# Patient Record
Sex: Female | Born: 1990 | Hispanic: Yes | Smoking: Never smoker
Health system: Southern US, Community
[De-identification: ages and names within clinical notes are randomized; demographics above are authoritative.]

## PROBLEM LIST (undated history)

## (undated) ENCOUNTER — Inpatient Hospital Stay (HOSPITAL_COMMUNITY): Payer: Self-pay

## (undated) DIAGNOSIS — G43909 Migraine, unspecified, not intractable, without status migrainosus: Secondary | ICD-10-CM

## (undated) DIAGNOSIS — J4520 Mild intermittent asthma, uncomplicated: Secondary | ICD-10-CM

## (undated) DIAGNOSIS — G40901 Epilepsy, unspecified, not intractable, with status epilepticus: Secondary | ICD-10-CM

## (undated) DIAGNOSIS — M419 Scoliosis, unspecified: Secondary | ICD-10-CM

## (undated) DIAGNOSIS — G9340 Encephalopathy, unspecified: Secondary | ICD-10-CM

## (undated) DIAGNOSIS — A749 Chlamydial infection, unspecified: Secondary | ICD-10-CM

## (undated) DIAGNOSIS — J45909 Unspecified asthma, uncomplicated: Secondary | ICD-10-CM

## (undated) DIAGNOSIS — N83209 Unspecified ovarian cyst, unspecified side: Secondary | ICD-10-CM

## (undated) DIAGNOSIS — S0990XA Unspecified injury of head, initial encounter: Secondary | ICD-10-CM

## (undated) DIAGNOSIS — Z87898 Personal history of other specified conditions: Secondary | ICD-10-CM

## (undated) DIAGNOSIS — R569 Unspecified convulsions: Secondary | ICD-10-CM

## (undated) HISTORY — DX: Mild intermittent asthma, uncomplicated: J45.20

## (undated) HISTORY — DX: Epilepsy, unspecified, not intractable, with status epilepticus: G40.901

## (undated) HISTORY — PX: BACK SURGERY: SHX140

## (undated) HISTORY — DX: Personal history of other specified conditions: Z87.898

## (undated) HISTORY — DX: Encephalopathy, unspecified: G93.40

---
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
## 2010-08-07 ENCOUNTER — Emergency Department (HOSPITAL_COMMUNITY)
Admission: EM | Admit: 2010-08-07 | Discharge: 2010-08-07 | Payer: Self-pay | Source: Home / Self Care | Admitting: Emergency Medicine

## 2010-08-07 ENCOUNTER — Inpatient Hospital Stay (HOSPITAL_COMMUNITY): Admission: AD | Admit: 2010-08-07 | Discharge: 2010-08-07 | Payer: Self-pay | Admitting: Obstetrics & Gynecology

## 2010-10-31 ENCOUNTER — Other Ambulatory Visit: Payer: Self-pay | Admitting: Family Medicine

## 2010-10-31 DIAGNOSIS — Z3689 Encounter for other specified antenatal screening: Secondary | ICD-10-CM

## 2010-11-06 ENCOUNTER — Ambulatory Visit (HOSPITAL_COMMUNITY)
Admission: RE | Admit: 2010-11-06 | Discharge: 2010-11-06 | Disposition: A | Discharge status: Home / Self Care | Payer: Medicaid Other | Source: Ambulatory Visit | Attending: Family Medicine | Admitting: Family Medicine

## 2010-11-06 ENCOUNTER — Encounter (HOSPITAL_COMMUNITY): Payer: Self-pay

## 2010-11-06 DIAGNOSIS — O358XX0 Maternal care for other (suspected) fetal abnormality and damage, not applicable or unspecified: Secondary | ICD-10-CM | POA: Insufficient documentation

## 2010-11-06 DIAGNOSIS — Z3689 Encounter for other specified antenatal screening: Secondary | ICD-10-CM

## 2010-11-06 DIAGNOSIS — Z1389 Encounter for screening for other disorder: Secondary | ICD-10-CM | POA: Insufficient documentation

## 2010-11-06 DIAGNOSIS — Z363 Encounter for antenatal screening for malformations: Secondary | ICD-10-CM | POA: Insufficient documentation

## 2010-11-15 ENCOUNTER — Other Ambulatory Visit: Payer: Self-pay | Admitting: Family Medicine

## 2010-11-15 DIAGNOSIS — Z3689 Encounter for other specified antenatal screening: Secondary | ICD-10-CM

## 2010-11-20 ENCOUNTER — Ambulatory Visit (HOSPITAL_COMMUNITY): Payer: Medicaid Other

## 2010-11-20 ENCOUNTER — Ambulatory Visit (HOSPITAL_COMMUNITY)
Admission: RE | Admit: 2010-11-20 | Discharge: 2010-11-20 | Disposition: A | Discharge status: Home / Self Care | Payer: Medicaid Other | Source: Ambulatory Visit | Attending: Family Medicine | Admitting: Family Medicine

## 2010-11-20 DIAGNOSIS — Z3689 Encounter for other specified antenatal screening: Secondary | ICD-10-CM | POA: Insufficient documentation

## 2010-12-04 LAB — GC/CHLAMYDIA PROBE AMP, GENITAL
Chlamydia, DNA Probe: NEGATIVE
GC Probe Amp, Genital: NEGATIVE

## 2010-12-04 LAB — WET PREP, GENITAL
Clue Cells Wet Prep HPF POC: NONE SEEN
Trich, Wet Prep: NONE SEEN
Yeast Wet Prep HPF POC: NONE SEEN

## 2010-12-04 LAB — POCT PREGNANCY, URINE: Preg Test, Ur: POSITIVE

## 2011-03-03 ENCOUNTER — Inpatient Hospital Stay (HOSPITAL_COMMUNITY)
Admission: AD | Admit: 2011-03-03 | Discharge: 2011-03-03 | Disposition: A | Discharge status: Home / Self Care | Payer: Medicaid Other | Source: Ambulatory Visit | Attending: Family Medicine | Admitting: Family Medicine

## 2011-03-03 DIAGNOSIS — O4700 False labor before 37 completed weeks of gestation, unspecified trimester: Secondary | ICD-10-CM | POA: Insufficient documentation

## 2011-03-15 ENCOUNTER — Inpatient Hospital Stay (HOSPITAL_COMMUNITY)
Admission: AD | Admit: 2011-03-15 | Discharge: 2011-03-15 | Disposition: A | Discharge status: Home / Self Care | Payer: Medicaid Other | Source: Ambulatory Visit | Attending: Obstetrics & Gynecology | Admitting: Obstetrics & Gynecology

## 2011-03-15 DIAGNOSIS — O4290 Premature rupture of membranes, unspecified as to length of time between rupture and onset of labor, unspecified weeks of gestation: Secondary | ICD-10-CM

## 2011-03-15 LAB — WET PREP, GENITAL
Trich, Wet Prep: NONE SEEN
Yeast Wet Prep HPF POC: NONE SEEN

## 2011-03-17 ENCOUNTER — Inpatient Hospital Stay (HOSPITAL_COMMUNITY)
Admission: AD | Admit: 2011-03-17 | Discharge: 2011-03-19 | DRG: 775 | Disposition: A | Discharge status: Home / Self Care | Payer: Medicaid Other | Source: Ambulatory Visit | Attending: Obstetrics & Gynecology | Admitting: Obstetrics & Gynecology

## 2011-03-17 LAB — CBC
HCT: 28.6 % — ABNORMAL LOW (ref 36.0–46.0)
Hemoglobin: 9.4 g/dL — ABNORMAL LOW (ref 12.0–15.0)
MCH: 28.7 pg (ref 26.0–34.0)
MCHC: 32.9 g/dL (ref 30.0–36.0)
MCV: 87.5 fL (ref 78.0–100.0)
Platelets: 203 10*3/uL (ref 150–400)
RBC: 3.27 MIL/uL — ABNORMAL LOW (ref 3.87–5.11)
RDW: 13.5 % (ref 11.5–15.5)
WBC: 10.4 10*3/uL (ref 4.0–10.5)

## 2011-03-17 LAB — ABO/RH: ABO/RH(D): O POS

## 2011-03-17 LAB — RPR: RPR Ser Ql: NONREACTIVE

## 2011-06-26 LAB — RAPID STREP SCREEN (MED CTR MEBANE ONLY): Streptococcus, Group A Screen (Direct): NEGATIVE

## 2011-06-28 LAB — COMPREHENSIVE METABOLIC PANEL
ALT: 11 U/L (ref 0–35)
AST: 18 U/L (ref 0–37)
Albumin: 3.9 g/dL (ref 3.5–5.2)
Alkaline Phosphatase: 59 U/L (ref 47–119)
BUN: 10 mg/dL (ref 6–23)
CO2: 26 mEq/L (ref 19–32)
Calcium: 9.4 mg/dL (ref 8.4–10.5)
Chloride: 103 mEq/L (ref 96–112)
Creatinine, Ser: 0.56 mg/dL (ref 0.4–1.2)
Glucose, Bld: 94 mg/dL (ref 70–99)
Potassium: 3.5 mEq/L (ref 3.5–5.1)
Sodium: 138 mEq/L (ref 135–145)
Total Bilirubin: 0.7 mg/dL (ref 0.3–1.2)
Total Protein: 7.4 g/dL (ref 6.0–8.3)

## 2011-06-28 LAB — CBC
HCT: 36.3 % (ref 36.0–49.0)
Hemoglobin: 12.5 g/dL (ref 12.0–16.0)
MCHC: 34.4 g/dL (ref 31.0–37.0)
MCV: 92.3 fL (ref 78.0–98.0)
Platelets: 207 10*3/uL (ref 150–400)
RBC: 3.93 MIL/uL (ref 3.80–5.70)
RDW: 12.7 % (ref 11.4–15.5)
WBC: 8.8 10*3/uL (ref 4.5–13.5)

## 2011-06-28 LAB — HCG, SERUM, QUALITATIVE: Preg, Serum: NEGATIVE

## 2011-09-24 NOTE — L&D Delivery Note (Signed)
Delivery Note At 4:30 PM a viable and healthy female was delivered with easily reduced nuchal x 1 via  (Presentation: Left Occiput Anterior ).  APGAR:8,9; weight 6 lb, 12 oz.   Placenta status: spontaneous, in tact with trailing membranes.  Cord: 3 vessel with the following complications: none.    Anesthesia: None   Episiotomy: None Lacerations: 1st degree vaginal, hemostatic, no repair Est. Blood Loss (mL): 350cc  Mom to postpartum.  Baby to nursery-stable. Mom planning to bf and bottle feed, hoping to bf more; has appt 8 weeks after delivery for mirena  Simone Curia 06/19/2012, 4:46 PM     I was present for delivery and agree with above. Napoleon Form, MD

## 2011-10-16 ENCOUNTER — Encounter (HOSPITAL_COMMUNITY): Payer: Self-pay

## 2011-10-16 ENCOUNTER — Emergency Department (HOSPITAL_COMMUNITY)
Admission: EM | Admit: 2011-10-16 | Discharge: 2011-10-16 | Disposition: A | Discharge status: Home / Self Care | Payer: Self-pay | Attending: Emergency Medicine | Admitting: Emergency Medicine

## 2011-10-16 DIAGNOSIS — H53149 Visual discomfort, unspecified: Secondary | ICD-10-CM | POA: Insufficient documentation

## 2011-10-16 DIAGNOSIS — R05 Cough: Secondary | ICD-10-CM | POA: Insufficient documentation

## 2011-10-16 DIAGNOSIS — R42 Dizziness and giddiness: Secondary | ICD-10-CM | POA: Insufficient documentation

## 2011-10-16 DIAGNOSIS — G40909 Epilepsy, unspecified, not intractable, without status epilepticus: Secondary | ICD-10-CM | POA: Insufficient documentation

## 2011-10-16 DIAGNOSIS — J3489 Other specified disorders of nose and nasal sinuses: Secondary | ICD-10-CM | POA: Insufficient documentation

## 2011-10-16 DIAGNOSIS — H9209 Otalgia, unspecified ear: Secondary | ICD-10-CM | POA: Insufficient documentation

## 2011-10-16 DIAGNOSIS — R51 Headache: Secondary | ICD-10-CM | POA: Insufficient documentation

## 2011-10-16 DIAGNOSIS — R059 Cough, unspecified: Secondary | ICD-10-CM | POA: Insufficient documentation

## 2011-10-16 DIAGNOSIS — R070 Pain in throat: Secondary | ICD-10-CM | POA: Insufficient documentation

## 2011-10-16 DIAGNOSIS — R110 Nausea: Secondary | ICD-10-CM | POA: Insufficient documentation

## 2011-10-16 DIAGNOSIS — H6690 Otitis media, unspecified, unspecified ear: Secondary | ICD-10-CM | POA: Insufficient documentation

## 2011-10-16 HISTORY — DX: Unspecified convulsions: R56.9

## 2011-10-16 HISTORY — DX: Unspecified injury of head, initial encounter: S09.90XA

## 2011-10-16 MED ORDER — METOCLOPRAMIDE HCL 5 MG/ML IJ SOLN
10.0000 mg | Freq: Once | INTRAMUSCULAR | Status: AC
  Administered 2011-10-16: 10 mg via INTRAVENOUS
  Filled 2011-10-16: qty 2

## 2011-10-16 MED ORDER — SODIUM CHLORIDE 0.9 % IV BOLUS (SEPSIS)
1000.0000 mL | Freq: Once | INTRAVENOUS | Status: AC
  Administered 2011-10-16: 1000 mL via INTRAVENOUS

## 2011-10-16 MED ORDER — AMOXICILLIN 500 MG PO CAPS: 500.0000 mg | ORAL_CAPSULE | Freq: Three times a day (TID) | ORAL | Status: AC

## 2011-10-16 MED ORDER — KETOROLAC TROMETHAMINE 30 MG/ML IJ SOLN
30.0000 mg | Freq: Once | INTRAMUSCULAR | Status: AC
  Administered 2011-10-16: 30 mg via INTRAVENOUS
  Filled 2011-10-16: qty 1

## 2011-10-16 MED ORDER — IBUPROFEN 800 MG PO TABS: 800.0000 mg | ORAL_TABLET | Freq: Three times a day (TID) | ORAL | Status: AC | PRN

## 2011-10-16 MED ORDER — DIPHENHYDRAMINE HCL 50 MG/ML IJ SOLN
25.0000 mg | Freq: Once | INTRAMUSCULAR | Status: AC
  Administered 2011-10-16: 50 mg via INTRAVENOUS
  Filled 2011-10-16: qty 1

## 2011-10-16 NOTE — ED Notes (Signed)
Rt. Side facial pain began last night, denies any injuries. Denies any dental problems

## 2011-10-16 NOTE — ED Provider Notes (Signed)
History     CSN: [id number]  Arrival date & time 10/16/11  1010   First MD Initiated Contact with Patient 10/16/11 1028      Chief Complaint  Patient presents with  . Facial Pain    (Consider location/radiation/quality/duration/timing/severity/associated sxs/prior treatment)  The history is provided by the patient.  21 year old female presents with R sided headache that began last night as she was lying in bed. Onset was gradual. She describes it as throbbing, tingling, and a 9/10. She denies any trauma or injury to head. Denies any dental or facial pain. Associated symptoms include photophobia, nausea, and dizziness. She reports that she gets a lot of headaches but usually not this bad. Her son was seen here in the ED about 1 week ago and diagnosed with bronchitis. She has had similar symptoms (cough, sore throat, congestion) for about 2 weeks. She also complains of L ear pain.   Past Medical History  Diagnosis Date  . Seizures   . Closed head injury     History reviewed. No pertinent past surgical history.  History reviewed. No pertinent family history.  History  Substance Use Topics  . Smoking status: Never Smoker   . Smokeless tobacco: Not on file  . Alcohol Use: No    OB History    Grav Para Term Preterm Abortions TAB SAB Ect Mult Living   1         1      Review of Systems All pertinent positives and negatives reviewed in the history of present illness  Allergies  Review of patient's allergies indicates no known allergies.  Home Medications  No current outpatient prescriptions on file.  BP 102/65  Pulse 110  Temp(Src) 98.5 F (36.9 C) (Oral)  Resp 16  Ht 5\' 1"  (1.549 m)  Wt 120 lb (54.432 kg)  BMI 22.67 kg/m2  SpO2 100%  LMP 10/14/2011  Breastfeeding? Unknown  Physical Exam  Constitutional: She is oriented to person, place, and time. She appears well-developed and well-nourished. No distress.  HENT:  Head: Normocephalic and atraumatic.  Right  Ear: Tympanic membrane, external ear and ear canal normal.  Left Ear: There is tenderness. Tympanic membrane is erythematous.  Mouth/Throat: Oropharynx is clear and moist.  Eyes: Pupils are equal, round, and reactive to light.  Cardiovascular: Normal rate, regular rhythm, normal heart sounds and intact distal pulses.   Pulmonary/Chest: Effort normal and breath sounds normal.  Abdominal: Soft. Bowel sounds are normal.  Musculoskeletal: Normal range of motion.  Neurological: She is alert and oriented to person, place, and time. She has normal strength. She displays no tremor. No cranial nerve deficit or sensory deficit. She displays no seizure activity.  Skin: Skin is warm and dry. She is not diaphoretic.  Psychiatric: She has a normal mood and affect. Her behavior is normal.    ED Course  Procedures (including critical care time)  Patient has a headache that was gradual in onset.  She was treated with pain medication here in the emergency room along with fluids.  The patient has been dealing with an Upper respiratory tract infection at this time appears to have an acute otitis media in her left ear.  Patient to be advised to return if any worsening in her condition.  This headache seems to be a migrainous type headache based on the fact she has photosensitivity and is unilateral.       MDM  MDM Reviewed: vitals and nursing note  \  Carlyle Dolly, PA-C 10/16/11 1227

## 2011-10-17 NOTE — ED Provider Notes (Signed)
Medical screening examination/treatment/procedure(s) were performed by non-physician practitioner and as supervising physician I was immediately available for consultation/collaboration.  Adley Castello, MD 10/17/11 0750 

## 2011-11-05 ENCOUNTER — Inpatient Hospital Stay (HOSPITAL_COMMUNITY)
Admission: AD | Admit: 2011-11-05 | Discharge: 2011-11-05 | Disposition: A | Discharge status: Home / Self Care | Payer: Medicaid Other | Source: Ambulatory Visit | Attending: Family Medicine | Admitting: Family Medicine

## 2011-11-05 ENCOUNTER — Encounter (HOSPITAL_COMMUNITY): Payer: Self-pay

## 2011-11-05 DIAGNOSIS — Z3201 Encounter for pregnancy test, result positive: Secondary | ICD-10-CM | POA: Insufficient documentation

## 2011-11-05 HISTORY — DX: Scoliosis, unspecified: M41.9

## 2011-11-05 LAB — POCT PREGNANCY, URINE: Preg Test, Ur: POSITIVE — AB

## 2011-11-05 NOTE — Progress Notes (Signed)
Pt states here for EDC only. Denies bleeding or vaginal d/c changes. Denies pain at present. Does have mild pain when picking up her 71 month old son. Has had some nausea. Denies at present.

## 2011-11-05 NOTE — ED Provider Notes (Signed)
Erin Price IS A 21 y.o. female who presents to MAU for pregnancy verification.   [place], [place] 11/06/11 1527

## 2011-11-06 LAB — POCT PREGNANCY, URINE: Preg Test, Ur: POSITIVE — AB

## 2011-11-07 NOTE — ED Provider Notes (Signed)
Chart reviewed and agree with management and plan.  

## 2012-01-10 ENCOUNTER — Inpatient Hospital Stay (HOSPITAL_COMMUNITY)
Admission: AD | Admit: 2012-01-10 | Discharge: 2012-01-11 | Disposition: A | Discharge status: Home / Self Care | Payer: Medicaid Other | Source: Ambulatory Visit | Attending: Obstetrics & Gynecology | Admitting: Obstetrics & Gynecology

## 2012-01-10 ENCOUNTER — Encounter (HOSPITAL_COMMUNITY): Payer: Self-pay | Admitting: *Deleted

## 2012-01-10 DIAGNOSIS — R109 Unspecified abdominal pain: Secondary | ICD-10-CM | POA: Insufficient documentation

## 2012-01-10 DIAGNOSIS — O209 Hemorrhage in early pregnancy, unspecified: Secondary | ICD-10-CM | POA: Insufficient documentation

## 2012-01-10 DIAGNOSIS — N939 Abnormal uterine and vaginal bleeding, unspecified: Secondary | ICD-10-CM

## 2012-01-10 DIAGNOSIS — N949 Unspecified condition associated with female genital organs and menstrual cycle: Secondary | ICD-10-CM

## 2012-01-10 LAB — URINE MICROSCOPIC-ADD ON

## 2012-01-10 LAB — WET PREP, GENITAL
Clue Cells Wet Prep HPF POC: NONE SEEN
Trich, Wet Prep: NONE SEEN
Yeast Wet Prep HPF POC: NONE SEEN

## 2012-01-10 LAB — URINALYSIS, ROUTINE W REFLEX MICROSCOPIC
Bilirubin Urine: NEGATIVE
Glucose, UA: NEGATIVE mg/dL
Ketones, ur: NEGATIVE mg/dL
Leukocytes, UA: NEGATIVE
Nitrite: NEGATIVE
Protein, ur: NEGATIVE mg/dL
Specific Gravity, Urine: 1.015 (ref 1.005–1.030)
Urobilinogen, UA: 1 mg/dL (ref 0.0–1.0)
pH: 7.5 (ref 5.0–8.0)

## 2012-01-10 NOTE — MAU Provider Note (Signed)
Kathryne Eriksson y.o.G2P1001 @[redacted]w[redacted]d  by LMP Chief Complaint  Patient presents with  . Abdominal Pain  . Vaginal Bleeding     First Provider Initiated Contact with Patient 01/10/12 2300      SUBJECTIVE  HPI: PT presents to MAU with left inguinal area sharp intermittent pain that is worse when lifting and when urinating.  Pt also reports seeing pinkish spotting when wiping at 9:30 pm tonight.  She denies seeing bleeding after urinating when arriving in MAU.  She denies uterine cramping/contractions, LOF, bright red vaginal bleeding, n/v, fever/chills.  Pt reports she and partner had intercourse last night.  She has appointment on Monday at [facility] for prenatal care.  Past Medical History  Diagnosis Date  . Seizures   . Closed head injury   . Scoliosis    Past Surgical History  Procedure Date  . Back surgery     screws placed for scoliosis   History   Social History  . Marital Status: Married    Spouse Name: N/A    Number of Children: N/A  . Years of Education: N/A   Occupational History  . Not on file.   Social History Main Topics  . Smoking status: Never Smoker   . Smokeless tobacco: Never Used  . Alcohol Use: No  . Drug Use: No  . Sexually Active: Yes    Birth Control/ Protection: None   Other Topics Concern  . Not on file   Social History Narrative  . No narrative on file   No current facility-administered medications on file prior to encounter.   No current outpatient prescriptions on file prior to encounter.   No Known Allergies  ROS: Pertinent items in HPI  OBJECTIVE Blood pressure 120/66, pulse 100, temperature 99 F (37.2 C), temperature source Oral, resp. rate 18, height 5' 1.75" (1.568 m), weight 51.71 kg (114 lb), last menstrual period 10/14/2011. GENERAL: Well-developed, well-nourished female in no acute distress.  ABDOMEN: Soft, nontender EXTREMITIES: Nontender, no edema Pelvic exam: Cervix with erythema surrounding os, visually closed, scant  white discharge, no bleeding noted, vaginal walls and external genitalia normal Cervical exam: Cervix 0/long/high, firm, posterior, no blood on glove following exam  FHT by doppler: 158  LAB RESULTS Results for orders placed during the hospital encounter of 01/10/12 (from the past 24 hour(s))  URINALYSIS, ROUTINE W REFLEX MICROSCOPIC     Status: Abnormal   Collection Time   01/10/12 10:25 PM      Component Value Range   Color, Urine YELLOW  YELLOW    APPearance CLOUDY (*) CLEAR    Specific Gravity, Urine 1.015  1.005 - 1.030    pH 7.5  5.0 - 8.0    Glucose, UA NEGATIVE  NEGATIVE (mg/dL)   Hgb urine dipstick TRACE (*) NEGATIVE    Bilirubin Urine NEGATIVE  NEGATIVE    Ketones, ur NEGATIVE  NEGATIVE (mg/dL)   Protein, ur NEGATIVE  NEGATIVE (mg/dL)   Urobilinogen, UA 1.0  0.0 - 1.0 (mg/dL)   Nitrite NEGATIVE  NEGATIVE    Leukocytes, UA NEGATIVE  NEGATIVE   URINE MICROSCOPIC-ADD ON     Status: Abnormal   Collection Time   01/10/12 10:25 PM      Component Value Range   Squamous Epithelial / LPF FEW (*) RARE    RBC / HPF 0-2  <3 (RBC/hpf)   Bacteria, UA FEW (*) RARE    Urine-Other AMORPHOUS URATES/PHOSPHATES    WET PREP, GENITAL     Status: Abnormal  Collection Time   01/10/12 11:00 PM      Component Value Range   Yeast Wet Prep HPF POC NONE SEEN  NONE SEEN    Trich, Wet Prep NONE SEEN  NONE SEEN    Clue Cells Wet Prep HPF POC NONE SEEN  NONE SEEN    WBC, Wet Prep HPF POC FEW (*) NONE SEEN     IMAGING Not indicated  ASSESSMENT Round ligament pain Postcoital vaginal bleeding in pregnancy  PLAN D/C home with bleeding precautions Urine sent for culture F/U with prenatal provider Return to MAU as needed  Medication List    Notice       You have not been prescribed any medications.             No Follow-up on file.    LEFTWICH-KIRBY, Francetta Ilg 01/10/2012 11:04 PM

## 2012-01-10 NOTE — Discharge Instructions (Signed)

## 2012-01-10 NOTE — MAU Note (Signed)
Pt states, " I've had sharp in my left lower abdomen off and on 2 days, and it occurred tonight at 9:30 and I went to the bathroom and when I wiped I saw pinkish blood on paper."

## 2012-01-11 LAB — GC/CHLAMYDIA PROBE AMP, GENITAL
Chlamydia, DNA Probe: NEGATIVE
GC Probe Amp, Genital: NEGATIVE

## 2012-01-20 ENCOUNTER — Other Ambulatory Visit (HOSPITAL_COMMUNITY): Payer: Self-pay | Admitting: Family

## 2012-01-20 DIAGNOSIS — Z3689 Encounter for other specified antenatal screening: Secondary | ICD-10-CM

## 2012-01-20 LAB — OB RESULTS CONSOLE RPR: RPR: NONREACTIVE

## 2012-01-20 LAB — OB RESULTS CONSOLE HIV ANTIBODY (ROUTINE TESTING): HIV: NONREACTIVE

## 2012-01-20 LAB — OB RESULTS CONSOLE RUBELLA ANTIBODY, IGM: Rubella: IMMUNE

## 2012-01-20 LAB — OB RESULTS CONSOLE GC/CHLAMYDIA
Chlamydia: NEGATIVE
Gonorrhea: NEGATIVE

## 2012-01-20 LAB — OB RESULTS CONSOLE ANTIBODY SCREEN: Antibody Screen: NEGATIVE

## 2012-01-20 LAB — OB RESULTS CONSOLE HEPATITIS B SURFACE ANTIGEN: Hepatitis B Surface Ag: NEGATIVE

## 2012-02-11 ENCOUNTER — Ambulatory Visit (HOSPITAL_COMMUNITY)
Admission: RE | Admit: 2012-02-11 | Discharge: 2012-02-11 | Disposition: A | Discharge status: Home / Self Care | Payer: Medicaid Other | Source: Ambulatory Visit | Attending: Family | Admitting: Family

## 2012-02-11 DIAGNOSIS — Z1389 Encounter for screening for other disorder: Secondary | ICD-10-CM | POA: Insufficient documentation

## 2012-02-11 DIAGNOSIS — Z363 Encounter for antenatal screening for malformations: Secondary | ICD-10-CM | POA: Insufficient documentation

## 2012-02-11 DIAGNOSIS — Z3689 Encounter for other specified antenatal screening: Secondary | ICD-10-CM

## 2012-02-11 DIAGNOSIS — O358XX0 Maternal care for other (suspected) fetal abnormality and damage, not applicable or unspecified: Secondary | ICD-10-CM | POA: Insufficient documentation

## 2012-05-01 ENCOUNTER — Encounter (HOSPITAL_COMMUNITY): Payer: Self-pay | Admitting: *Deleted

## 2012-05-01 ENCOUNTER — Inpatient Hospital Stay (HOSPITAL_COMMUNITY)
Admission: AD | Admit: 2012-05-01 | Discharge: 2012-05-01 | Disposition: A | Discharge status: Home / Self Care | Payer: Medicaid Other | Source: Ambulatory Visit | Attending: Obstetrics & Gynecology | Admitting: Obstetrics & Gynecology

## 2012-05-01 DIAGNOSIS — R109 Unspecified abdominal pain: Secondary | ICD-10-CM | POA: Insufficient documentation

## 2012-05-01 DIAGNOSIS — O99891 Other specified diseases and conditions complicating pregnancy: Secondary | ICD-10-CM | POA: Insufficient documentation

## 2012-05-01 DIAGNOSIS — N949 Unspecified condition associated with female genital organs and menstrual cycle: Secondary | ICD-10-CM

## 2012-05-01 DIAGNOSIS — K5900 Constipation, unspecified: Secondary | ICD-10-CM | POA: Insufficient documentation

## 2012-05-01 LAB — URINALYSIS, ROUTINE W REFLEX MICROSCOPIC
Bilirubin Urine: NEGATIVE
Glucose, UA: NEGATIVE mg/dL
Hgb urine dipstick: NEGATIVE
Ketones, ur: NEGATIVE mg/dL
Nitrite: NEGATIVE
Protein, ur: NEGATIVE mg/dL
Specific Gravity, Urine: 1.015 (ref 1.005–1.030)
Urobilinogen, UA: 0.2 mg/dL (ref 0.0–1.0)
pH: 8 (ref 5.0–8.0)

## 2012-05-01 LAB — URINE MICROSCOPIC-ADD ON

## 2012-05-01 MED ORDER — POLYETHYLENE GLYCOL 3350 17 GM/SCOOP PO POWD: 17.0000 g | Freq: Every day | ORAL | Status: AC | PRN

## 2012-05-01 MED ORDER — DOCUSATE SODIUM 100 MG PO CAPS: 100.0000 mg | ORAL_CAPSULE | Freq: Every day | ORAL | Status: AC

## 2012-05-01 MED ORDER — ACETAMINOPHEN 325 MG PO TABS
650.0000 mg | ORAL_TABLET | Freq: Once | ORAL | Status: AC
  Administered 2012-05-01: 650 mg via ORAL
  Filled 2012-05-01: qty 2

## 2012-05-01 NOTE — MAU Note (Signed)
Spoke with Dr Thad Ranger and updated on pts fetal monitor strip., and fetal heart rate. No orders received.

## 2012-05-01 NOTE — MAU Provider Note (Signed)
  History    Stomach pain for 2-3 days. Lower abdomen all the way across. Constant. Worse today. 6/10 now. Walking/moving makes it better. Hurts more lying down. Does endorse constipation but had BM yesterday. No dysuria. No vaginal discharge. Treated for yeast a week ago. No N/V.  Fetal movement okay. No LOF, no VB, no contractions.   CSN: [id number]  Arrival date and time: 05/01/12 1951   None     Chief Complaint  Patient presents with  . Contractions   HPI  OB History    Grav Para Term Preterm Abortions TAB SAB Ect Mult Living   2 1 1       1       Past Medical History  Diagnosis Date  . Seizures   . Closed head injury   . Scoliosis     Past Surgical History  Procedure Date  . Back surgery     screws placed for scoliosis    Family History  Problem Relation Age of Onset  . Anesthesia problems Neg Hx   . Hypertension Father   . Diabetes Maternal Grandmother   . Hypertension Maternal Grandmother   . Diabetes Paternal Grandmother   . Hypertension Paternal Grandmother     History  Substance Use Topics  . Smoking status: Never Smoker   . Smokeless tobacco: Never Used  . Alcohol Use: No    Allergies: No Known Allergies  Prescriptions prior to admission  Medication Sig Dispense Refill  . Prenatal Vit-Fe Fumarate-FA (PRENATAL MULTIVITAMIN) TABS Take 1 tablet by mouth daily.        Review of Systems  Constitutional: Negative for fever and chills.  Eyes: Negative for blurred vision and double vision.  Gastrointestinal: Positive for abdominal pain and constipation. Negative for heartburn, nausea and vomiting.  Genitourinary: Negative for dysuria and urgency.  Musculoskeletal: Negative for back pain.  Neurological: Negative for dizziness and headaches.  All other systems reviewed and are negative.   Physical Exam   Blood pressure 104/56, pulse 99, temperature 98.1 F (36.7 C), temperature source Oral, resp. rate 16, height 5\' 1"  (1.549 m), weight 59.875 kg  (132 lb), last menstrual period 10/14/2011.  Physical Exam  Nursing note and vitals reviewed. Constitutional: She is oriented to person, place, and time. She appears well-developed and well-nourished. No distress.  HENT:  Head: Normocephalic.  Cardiovascular: Normal rate and regular rhythm.   Respiratory: Effort normal.  GI: Soft. She exhibits no distension. There is Tenderness: Mild, LLQ, RLQ, suprapubic.. There is no rebound and no guarding.  Genitourinary: Vagina normal.       Hard stool palpable in rectum with vag exam.  No CMT or vaginal discharge.  Musculoskeletal: She exhibits no edema.  Neurological: She is alert and oriented to person, place, and time.  Skin: Skin is warm and dry. She is not diaphoretic.  Psychiatric: She has a normal mood and affect.    MAU Course  Procedures  NST reactive:  Baseline 135, + accels, no decels, mod variability Tylenol given in MAU - pain improved. Dilation: Fingertip Effacement (%): Thick Station: Ballotable Exam by:: Dr Thad Ranger     Assessment and Plan  20 y.o. G2P1001 at [redacted]w[redacted]d with lower abdominal pain. 1. Round ligament pain - acetaminophen as needed 2.  Constipation - colace daily, miralax as needed 3.  Discharge home. F/U with Health Dept as scheduled (10 days)    Napoleon Form 05/01/2012, 9:17 PM

## 2012-05-01 NOTE — Progress Notes (Signed)
Dr Ferry in and discussed d/c plan. Written and verbal d/c instructions given and understanding voiced.  

## 2012-05-01 NOTE — MAU Note (Signed)
Pt states, " I've had contractions in my low abdomen off and on for two days. It is worse today."

## 2012-06-19 ENCOUNTER — Encounter (HOSPITAL_COMMUNITY): Payer: Self-pay | Admitting: Anesthesiology

## 2012-06-19 ENCOUNTER — Inpatient Hospital Stay (HOSPITAL_COMMUNITY)
Admission: AD | Admit: 2012-06-19 | Discharge: 2012-06-21 | DRG: 775 | Disposition: A | Discharge status: Home / Self Care | Payer: Medicaid Other | Source: Ambulatory Visit | Attending: Obstetrics & Gynecology | Admitting: Obstetrics & Gynecology

## 2012-06-19 ENCOUNTER — Encounter (HOSPITAL_COMMUNITY): Payer: Self-pay | Admitting: *Deleted

## 2012-06-19 ENCOUNTER — Inpatient Hospital Stay (HOSPITAL_COMMUNITY): Payer: Medicaid Other | Admitting: Anesthesiology

## 2012-06-19 ENCOUNTER — Other Ambulatory Visit: Payer: Self-pay | Admitting: Family Medicine

## 2012-06-19 LAB — CBC
HCT: 32.5 % — ABNORMAL LOW (ref 36.0–46.0)
Hemoglobin: 10.6 g/dL — ABNORMAL LOW (ref 12.0–15.0)
MCH: 28.5 pg (ref 26.0–34.0)
MCHC: 32.6 g/dL (ref 30.0–36.0)
MCV: 87.4 fL (ref 78.0–100.0)
Platelets: 179 10*3/uL (ref 150–400)
RBC: 3.72 MIL/uL — ABNORMAL LOW (ref 3.87–5.11)
RDW: 12.8 % (ref 11.5–15.5)
WBC: 9.1 10*3/uL (ref 4.0–10.5)

## 2012-06-19 LAB — RPR: RPR Ser Ql: NONREACTIVE

## 2012-06-19 LAB — TYPE AND SCREEN
ABO/RH(D): O POS
Antibody Screen: NEGATIVE

## 2012-06-19 LAB — OB RESULTS CONSOLE GBS: GBS: NEGATIVE

## 2012-06-19 LAB — GROUP B STREP BY PCR: Group B strep by PCR: NEGATIVE

## 2012-06-19 MED ORDER — IBUPROFEN 600 MG PO TABS: 600.0000 mg | ORAL_TABLET | Freq: Four times a day (QID) | ORAL | Status: DC | PRN

## 2012-06-19 MED ORDER — LIDOCAINE HCL (PF) 1 % IJ SOLN
30.0000 mL | INTRAMUSCULAR | Status: DC | PRN
  Filled 2012-06-19: qty 30

## 2012-06-19 MED ORDER — OXYCODONE-ACETAMINOPHEN 5-325 MG PO TABS: 1.0000 | ORAL_TABLET | ORAL | Status: DC | PRN

## 2012-06-19 MED ORDER — OXYTOCIN 40 UNITS IN LACTATED RINGERS INFUSION - SIMPLE MED
62.5000 mL/h | Freq: Once | INTRAVENOUS | Status: AC
  Administered 2012-06-19: 62.5 mL/h via INTRAVENOUS
  Filled 2012-06-19: qty 1000

## 2012-06-19 MED ORDER — PHENYLEPHRINE 40 MCG/ML (10ML) SYRINGE FOR IV PUSH (FOR BLOOD PRESSURE SUPPORT): 80.0000 ug | PREFILLED_SYRINGE | INTRAVENOUS | Status: DC | PRN

## 2012-06-19 MED ORDER — LACTATED RINGERS IV SOLN
INTRAVENOUS | Status: DC
  Administered 2012-06-19 (×2): via INTRAVENOUS

## 2012-06-19 MED ORDER — PENICILLIN G POTASSIUM 5000000 UNITS IJ SOLR
2.5000 10*6.[IU] | INTRAVENOUS | Status: DC
  Filled 2012-06-19 (×4): qty 2.5

## 2012-06-19 MED ORDER — LACTATED RINGERS IV SOLN: 500.0000 mL | Freq: Once | INTRAVENOUS | Status: DC

## 2012-06-19 MED ORDER — ONDANSETRON HCL 4 MG PO TABS: 4.0000 mg | ORAL_TABLET | ORAL | Status: DC | PRN

## 2012-06-19 MED ORDER — BENZOCAINE-MENTHOL 20-0.5 % EX AERO: 1.0000 "application " | INHALATION_SPRAY | CUTANEOUS | Status: DC | PRN

## 2012-06-19 MED ORDER — EPHEDRINE 5 MG/ML INJ: 10.0000 mg | INTRAVENOUS | Status: DC | PRN

## 2012-06-19 MED ORDER — SIMETHICONE 80 MG PO CHEW: 80.0000 mg | CHEWABLE_TABLET | ORAL | Status: DC | PRN

## 2012-06-19 MED ORDER — LANOLIN HYDROUS EX OINT: TOPICAL_OINTMENT | CUTANEOUS | Status: DC | PRN

## 2012-06-19 MED ORDER — FENTANYL 2.5 MCG/ML BUPIVACAINE 1/10 % EPIDURAL INFUSION (WH - ANES)
14.0000 mL/h | INTRAMUSCULAR | Status: DC
  Filled 2012-06-19: qty 60

## 2012-06-19 MED ORDER — DIPHENHYDRAMINE HCL 25 MG PO CAPS: 25.0000 mg | ORAL_CAPSULE | Freq: Four times a day (QID) | ORAL | Status: DC | PRN

## 2012-06-19 MED ORDER — EPHEDRINE 5 MG/ML INJ
10.0000 mg | INTRAVENOUS | Status: DC | PRN
  Filled 2012-06-19: qty 4

## 2012-06-19 MED ORDER — PRENATAL MULTIVITAMIN CH
1.0000 | ORAL_TABLET | Freq: Every day | ORAL | Status: DC
  Administered 2012-06-20 – 2012-06-21 (×2): 1 via ORAL
  Filled 2012-06-19 (×2): qty 1

## 2012-06-19 MED ORDER — ZOLPIDEM TARTRATE 5 MG PO TABS: 5.0000 mg | ORAL_TABLET | Freq: Every evening | ORAL | Status: DC | PRN

## 2012-06-19 MED ORDER — DIBUCAINE 1 % RE OINT: 1.0000 "application " | TOPICAL_OINTMENT | RECTAL | Status: DC | PRN

## 2012-06-19 MED ORDER — LACTATED RINGERS IV SOLN: 500.0000 mL | INTRAVENOUS | Status: DC | PRN

## 2012-06-19 MED ORDER — SODIUM BICARBONATE 8.4 % IV SOLN
INTRAVENOUS | Status: DC | PRN
  Administered 2012-06-19: 4 mL via EPIDURAL

## 2012-06-19 MED ORDER — OXYTOCIN BOLUS FROM INFUSION
500.0000 mL | Freq: Once | INTRAVENOUS | Status: DC
  Filled 2012-06-19: qty 500

## 2012-06-19 MED ORDER — INFLUENZA VIRUS VACC SPLIT PF IM SUSP
0.5000 mL | INTRAMUSCULAR | Status: AC
  Administered 2012-06-20: 0.5 mL via INTRAMUSCULAR
  Filled 2012-06-19: qty 0.5

## 2012-06-19 MED ORDER — ACETAMINOPHEN 325 MG PO TABS: 650.0000 mg | ORAL_TABLET | ORAL | Status: DC | PRN

## 2012-06-19 MED ORDER — IBUPROFEN 600 MG PO TABS
600.0000 mg | ORAL_TABLET | Freq: Four times a day (QID) | ORAL | Status: DC
  Administered 2012-06-19 – 2012-06-21 (×5): 600 mg via ORAL
  Filled 2012-06-19 (×6): qty 1

## 2012-06-19 MED ORDER — FENTANYL 2.5 MCG/ML BUPIVACAINE 1/10 % EPIDURAL INFUSION (WH - ANES)
INTRAMUSCULAR | Status: DC | PRN
  Administered 2012-06-19: 14 mL/h via EPIDURAL

## 2012-06-19 MED ORDER — ONDANSETRON HCL 4 MG/2ML IJ SOLN: 4.0000 mg | INTRAMUSCULAR | Status: DC | PRN

## 2012-06-19 MED ORDER — TETANUS-DIPHTH-ACELL PERTUSSIS 5-2.5-18.5 LF-MCG/0.5 IM SUSP
0.5000 mL | Freq: Once | INTRAMUSCULAR | Status: AC
  Administered 2012-06-20: 0.5 mL via INTRAMUSCULAR
  Filled 2012-06-19: qty 0.5

## 2012-06-19 MED ORDER — WITCH HAZEL-GLYCERIN EX PADS: 1.0000 "application " | MEDICATED_PAD | CUTANEOUS | Status: DC | PRN

## 2012-06-19 MED ORDER — SENNOSIDES-DOCUSATE SODIUM 8.6-50 MG PO TABS
2.0000 | ORAL_TABLET | Freq: Every day | ORAL | Status: DC
  Administered 2012-06-20: 2 via ORAL

## 2012-06-19 MED ORDER — DIPHENHYDRAMINE HCL 50 MG/ML IJ SOLN: 12.5000 mg | INTRAMUSCULAR | Status: DC | PRN

## 2012-06-19 MED ORDER — PENICILLIN G POTASSIUM 5000000 UNITS IJ SOLR
5.0000 10*6.[IU] | Freq: Once | INTRAVENOUS | Status: DC
  Filled 2012-06-19: qty 5

## 2012-06-19 MED ORDER — ONDANSETRON HCL 4 MG/2ML IJ SOLN: 4.0000 mg | Freq: Four times a day (QID) | INTRAMUSCULAR | Status: DC | PRN

## 2012-06-19 MED ORDER — CITRIC ACID-SODIUM CITRATE 334-500 MG/5ML PO SOLN: 30.0000 mL | ORAL | Status: DC | PRN

## 2012-06-19 NOTE — Progress Notes (Signed)
Erin Price is a 21 y.o. G2P1001 at [redacted]w[redacted]d admitted for active labor  Subjective:  Not feeling contractions, some pressure.  Objective: BP 116/65  Pulse 76  Temp 98.9 F (37.2 C) (Oral)  Resp 18  Ht 5\' 1"  (1.549 m)  Wt 61.689 kg (136 lb)  BMI 25.70 kg/m2  LMP 10/14/2011      FHT:  FHR: 135 bpm, variability: moderate,  accelerations:  Present,  decelerations:  Absent UC:   Not registering or feeling contractions SVE:   Dilation: 7 Effacement (%): 90 Station: -1 Exam by:: Dr Thad Ranger  Labs: Lab Results  Component Value Date   WBC 9.1 06/19/2012   HGB 10.6* 06/19/2012   HCT 32.5* 06/19/2012   MCV 87.4 06/19/2012   PLT 179 06/19/2012    Assessment / Plan: Spontaneous labor. Progress in effacement and station.  Labor: AROM  Preeclampsia:  n/a Fetal Wellbeing:  Category I Pain Control:  Labor support without medications I/D:  n/a GBS rapid negative Anticipated MOD:  NSVD  Napoleon Form 06/19/2012, 1:53 PM

## 2012-06-19 NOTE — Anesthesia Procedure Notes (Signed)
Epidural Patient location during procedure: OB  Preanesthetic Checklist Completed: patient identified, site marked, surgical consent, pre-op evaluation, timeout performed, IV checked, risks and benefits discussed and monitors and equipment checked  Epidural Patient position: sitting Prep: site prepped and draped and DuraPrep Patient monitoring: continuous pulse ox and blood pressure Approach: midline Injection technique: LOR air  Needle:  Needle type: Tuohy  Needle gauge: 17 G Needle length: 9 cm and 9 Needle insertion depth: 4 cm Catheter type: closed end flexible Catheter size: 19 Gauge Catheter at skin depth: 10 cm Test dose: negative  Assessment Events: blood not aspirated, injection not painful, no injection resistance, negative IV test and no paresthesia  Additional Notes Dosing of Epidural:  1st dose, through needle ............................................. epi 1:200K + Xylocaine 40 mg  2nd dose, through catheter, after waiting 3 minutes.....epi 1:200K + Xylocaine 40 mg  3rd dose, through catheter after waiting 3 minutes .............................Marcaine   4mg   ( mg Marcaine are expressed as equivilent  cc's medication removed from the 0.1%Bupiv / fentanyl syringe from L&D pump)  ( 2% Xylo charted as a single dose in Epic Meds for ease of charting; actual dosing was fractionated as above, for saftey's sake)  As each dose occurred, patient was free of IV sx; and patient exhibited no evidence of SA injection.  Patient is more comfortable after epidural dosed. Please see RN's note for documentation of vital signs,and FHR which are stable.  Patient reminded not to try to ambulate with numb legs, and that an RN must be present the 1st time she attempts to get up.    

## 2012-06-19 NOTE — H&P (Signed)
I saw and examined patient and agree with above. Emeric Novinger, MD 

## 2012-06-19 NOTE — H&P (Signed)
Erin Price is a 21 y.o. female presenting for active labor. History  This is a 21 y.o. G2P1001 at [redacted]w[redacted]d here in active labor.  Pt presents from HD where she was measured at 6 cm with regular contractions.  Pt states she started contracting 1 month ago, getting stronger 2 weeks ago, and on check in clinic today was found to be 6cm with bloody show.  Denies other vaginal bleeding or rupture of membranes.  Reports +FM.  [facility]atal [facility] at [facility] with no complications.  Denies h/o STI and reports 18w [place] normal - HD scanned records list 18 wk [place] and do not mention any abnormalities.  OB History    Grav Para Term Preterm Abortions TAB SAB Ect Mult Living   2 1 1       1     1st pregnancy delivered at around 37 weeks (greater than 37 weeks) with no complications, NSVD. Reports anemia during pregnancy and baby born with jaundice.  Past Medical History  Diagnosis Date  . Seizures   . Closed head injury   . Scoliosis   h/o asthma, currently no treatment needed MEDS - PNV NKDA H/o 1 seizure  Past Surgical History  Procedure Date  . Back surgery     screws placed for scoliosis   Family History: family history includes Diabetes in her maternal grandmother and paternal grandmother and Hypertension in her father, maternal grandmother, and paternal grandmother.  There is no history of Anesthesia problems. Social History:  reports that she has never smoked. She has never used smokeless tobacco. She reports that she does not drink alcohol or use illicit drugs. Lives with husband and son and denies DV.  Stay at home mom.  Prenatal Transfer Tool  Maternal Diabetes: No BG 1 hr 86 Genetic Screening: Normal Maternal Ultrasounds/Referrals: Normal Fetal Ultrasounds or other Referrals:  None Maternal Substance Abuse:  No Significant Maternal Medications:  None Significant Maternal Lab Results:  None - GBS unknown Other Comments:  None  ROS Per HPI; otherwise neg  Dilation: 7 Effacement  (%): 70 Station: -1 Exam by:: L. McDaniel RN Blood pressure 116/71, pulse 88, temperature 98.9 F (37.2 C), temperature source Oral, resp. rate 18, height 5\' 1"  (1.549 m), weight 61.689 kg (136 lb), last menstrual period 10/14/2011. Exam Physical Exam  GEN: NAD CV: RRR PULM: CTAB, nl effort ABD: gravid Fetal monitor: 135 bpm, moderate variability, accelerations present, no decelerations, Category I tracing  Prenatal labs: ABO, Rh:  O pos Ab neg Antibody: Negative (04/29 0000) Rubella:   RPR: Nonreactive (04/29 0000)  HBsAg: Negative (04/29 0000)  HIV: Non-reactive (04/29 0000)  GBS:   unknown  Assessment/Plan: This is a 21 y.o. G2P1001 at [redacted]w[redacted]d here in active labor.   1. Management of labor - Active labor - Admit to [employer] - Wants no pain medication or epidural - Anticipate NSVD  2. Fetal wellbeing - Category I tracing  3. GBS unknown - GBS PCR ordered; started PCN for empiric tx in GBS unknown with premature status, PCR neg so d/c'ed PCN  4. Postpartum management - plans to breast and bottlefeed and has appt 8 weeks after delivery for mirena  Simone Curia 06/19/2012, 12:06 PM

## 2012-06-19 NOTE — Progress Notes (Signed)
Delivery of live viable female by Dr Kerby Moors, Resident, assisted by Dr ferry. APGARs 8, 9

## 2012-06-19 NOTE — Anesthesia Preprocedure Evaluation (Signed)
Anesthesia Evaluation  Patient identified by MRN, date of birth, ID band Patient awake    Reviewed: Allergy & Precautions, H&P , Patient's Chart, lab work & pertinent test results  Airway Mallampati: II TM Distance: >3 FB Neck ROM: full    Dental  (+) Teeth Intact   Pulmonary  breath sounds clear to auscultation        Cardiovascular Rhythm:regular Rate:Normal     Neuro/Psych    GI/Hepatic   Endo/Other    Renal/GU      Musculoskeletal   Abdominal   Peds  Hematology   Anesthesia Other Findings       Reproductive/Obstetrics (+) Pregnancy                           Anesthesia Physical Anesthesia Plan  ASA: II  Anesthesia Plan: Epidural   Post-op Pain Management:    Induction:   Airway Management Planned:   Additional Equipment:   Intra-op Plan:   Post-operative Plan:   Informed Consent: I have reviewed the patients History and Physical, chart, labs and discussed the procedure including the risks, benefits and alternatives for the proposed anesthesia with the patient or authorized representative who has indicated his/her understanding and acceptance.   Dental Advisory Given  Plan Discussed with:   Anesthesia Plan Comments: (Labs checked- platelets confirmed with RN in room. Fetal heart tracing, per RN, reported to be stable enough for sitting procedure. I have discussed difficulty of placing an epidural with Hx of scoliosis repair and increased risk of HA Discussed epidural, and patient consents to the procedure:  included risk of possible headache,backache, failed block, allergic reaction, and nerve injury. This patient was asked if she had any questions or concerns before the procedure started. )        Anesthesia Quick Evaluation

## 2012-06-20 MED ORDER — IBUPROFEN 600 MG PO TABS: 600.0000 mg | ORAL_TABLET | Freq: Four times a day (QID) | ORAL | Status: DC

## 2012-06-20 NOTE — Addendum Note (Signed)
Addendum  created 06/20/12 2002 by Starlee Corralejo D Costantino Kohlbeck, CRNA   Modules edited:Charges VN, Notes Section    

## 2012-06-20 NOTE — Addendum Note (Signed)
Addendum  created 06/20/12 2002 by Len Blalock, CRNA   Modules edited:Charges VN, Notes Section

## 2012-06-20 NOTE — Anesthesia Postprocedure Evaluation (Signed)
  Anesthesia Post-op Note  Patient: Erin Price  Procedure(s) Performed: * No procedures listed *  Patient Location: PACU and Mother/Baby  Anesthesia Type: Epidural  Level of Consciousness: awake, alert  and oriented  Airway and Oxygen Therapy: Patient Spontanous Breathing    Post-op Assessment: Patient's Cardiovascular Status Stable and Respiratory Function Stable  Post-op Vital Signs: stable  Complications: No apparent anesthesia complications

## 2012-06-20 NOTE — Discharge Summary (Signed)
Obstetric Discharge Summary Reason for Admission: onset of labor Prenatal Procedures: ultrasound Intrapartum Procedures: spontaneous vaginal delivery Postpartum Procedures: none Complications-Operative and Postpartum: none Hemoglobin  Date Value Range Status  06/19/2012 10.6* 12.0 - 15.0 g/dL Final     HCT  Date Value Range Status  06/19/2012 32.5* 36.0 - 46.0 % Final    Physical Exam:  General: alert, cooperative and no distress Lochia: appropriate Uterine Fundus: firm Incision: n/a DVT Evaluation: No evidence of DVT seen on physical exam.  Discharge Diagnoses: Preterm vaginal delivery  Discharge Information: Date: 06/20/2012 Activity: pelvic rest Diet: routine Medications: Ibuprofen Condition: stable Instructions: refer to practice specific booklet Discharge to: home Follow-up Information    Follow up with [facility] [place]. In 8 weeks. (As scheduled)    Contact information:   [address] [place] [address] [phone]         Newborn Data: Live born female  Birth Weight: 6 lb 12 oz (3062 g) APGAR: 8, 9  Home with mother.  Napoleon Form 06/20/2012, 7:41 AM

## 2012-06-21 NOTE — Progress Notes (Signed)
I have seen and examined this patient and agree the above assessment. CRESENZO-DISHMAN,Erin Price 06/21/2012 11:51 AM

## 2012-06-21 NOTE — Progress Notes (Signed)
Post Partum Day 2 Subjective: no complaints, up ad lib, voiding and tolerating PO, minimal pain, was planning d/c yesterday but baby wasn't ready d/tr prematurity.  Objective: Blood pressure 107/66, pulse 79, temperature 98 F (36.7 C), temperature source Oral, resp. rate 18, height 5\' 1"  (1.549 m), weight 61.689 kg (136 lb), last menstrual period 10/14/2011, SpO2 98.00%, unknown if currently breastfeeding.  Physical Exam:  General: alert and cooperative Lochia: appropriate Uterine Fundus: firm Incision: healing well DVT Evaluation: No evidence of DVT seen on physical exam.   Basename 06/19/12 1030  HGB 10.6*  HCT 32.5*    Assessment/Plan: Discharge home and Breastfeeding   LOS: 2 days   Pharoah Goggins N 06/21/2012, 9:18 AM

## 2012-06-22 NOTE — Progress Notes (Signed)
Post discharge chart review completed.  

## 2012-09-23 ENCOUNTER — Emergency Department (HOSPITAL_COMMUNITY)
Admission: EM | Admit: 2012-09-23 | Discharge: 2012-09-23 | Discharge status: Against Medical Advice | Payer: Self-pay | Attending: Emergency Medicine | Admitting: Emergency Medicine

## 2012-09-23 ENCOUNTER — Encounter (HOSPITAL_COMMUNITY): Payer: Self-pay | Admitting: *Deleted

## 2012-09-23 DIAGNOSIS — R51 Headache: Secondary | ICD-10-CM | POA: Insufficient documentation

## 2012-09-23 LAB — URINALYSIS, ROUTINE W REFLEX MICROSCOPIC
Bilirubin Urine: NEGATIVE
Glucose, UA: NEGATIVE mg/dL
Ketones, ur: NEGATIVE mg/dL
Leukocytes, UA: NEGATIVE
Nitrite: NEGATIVE
Protein, ur: NEGATIVE mg/dL
Specific Gravity, Urine: 1.029 (ref 1.005–1.030)
Urobilinogen, UA: 1 mg/dL (ref 0.0–1.0)
pH: 7 (ref 5.0–8.0)

## 2012-09-23 LAB — URINE MICROSCOPIC-ADD ON

## 2012-09-23 LAB — POCT PREGNANCY, URINE: Preg Test, Ur: NEGATIVE

## 2012-09-23 NOTE — ED Notes (Signed)
Pt called x1, unable to locate. 

## 2012-09-23 NOTE — ED Notes (Signed)
Called x2 from triage, unable to locate

## 2012-09-23 NOTE — ED Notes (Addendum)
Pt has hx of migraines. Pt states this migraine started yesterday along with back pain (previous surgery on back.) Pt states she vomited today and is having light sensitivity.  pt states sore throat started this morning. Pt states not worse sore throat in her life just barely notices it. Pt throat is not red or swollen.

## 2013-03-12 ENCOUNTER — Emergency Department (HOSPITAL_COMMUNITY)
Admission: EM | Admit: 2013-03-12 | Discharge: 2013-03-12 | Disposition: A | Discharge status: Home / Self Care | Payer: Self-pay | Attending: Emergency Medicine | Admitting: Emergency Medicine

## 2013-03-12 ENCOUNTER — Encounter (HOSPITAL_COMMUNITY): Payer: Self-pay | Admitting: Emergency Medicine

## 2013-03-12 DIAGNOSIS — Z9889 Other specified postprocedural states: Secondary | ICD-10-CM | POA: Insufficient documentation

## 2013-03-12 DIAGNOSIS — Z8739 Personal history of other diseases of the musculoskeletal system and connective tissue: Secondary | ICD-10-CM | POA: Insufficient documentation

## 2013-03-12 DIAGNOSIS — Z8669 Personal history of other diseases of the nervous system and sense organs: Secondary | ICD-10-CM | POA: Insufficient documentation

## 2013-03-12 DIAGNOSIS — Z87828 Personal history of other (healed) physical injury and trauma: Secondary | ICD-10-CM | POA: Insufficient documentation

## 2013-03-12 DIAGNOSIS — M546 Pain in thoracic spine: Secondary | ICD-10-CM | POA: Insufficient documentation

## 2013-03-12 MED ORDER — PREDNISONE 50 MG PO TABS: 50.0000 mg | ORAL_TABLET | Freq: Every day | ORAL | Status: DC

## 2013-03-12 MED ORDER — HYDROCODONE-ACETAMINOPHEN 5-325 MG PO TABS: 1.0000 | ORAL_TABLET | Freq: Four times a day (QID) | ORAL | Status: DC | PRN

## 2013-03-12 NOTE — ED Provider Notes (Signed)
Medical screening examination/treatment/procedure(s) were performed by non-physician practitioner and as supervising physician I was immediately available for consultation/collaboration.  Ethelda Chick, MD 03/12/13 [phone]

## 2013-03-12 NOTE — ED Provider Notes (Signed)
History  This chart was scribed for Ebbie Ridge, PA-C by Ladona Ridgel Day, ED scribe. This patient was seen in room WTR7/WTR7 and the patient's care was started at 1622.   CSN: [id number]  Arrival date & time 03/12/13  1622   First MD Initiated Contact with Patient 03/12/13 1652      No chief complaint on file.  The history is provided by the patient. No language interpreter was used.   HPI Comments: Erin Price is a 22 y.o. female who presents to the Emergency Department complaining of L upper back pain. Patient denies chest pain, SOB, weakness, numbness, syncope, vomiting, nausea, abdominal pain, headache, blurred vision, or fever. The patient states that she always has back pain but has worsened over the last week. The patient states that this feels consistent with her typical pain but just more significant   Lower back pain over the past week, hx of scoliosis No fever, no other pain Pain w/deep breath Past Medical History  Diagnosis Date  . Seizures   . Closed head injury   . Scoliosis     Past Surgical History  Procedure Laterality Date  . Back surgery      screws placed for scoliosis    Family History  Problem Relation Age of Onset  . Anesthesia problems Neg Hx   . Hypertension Father   . Diabetes Maternal Grandmother   . Hypertension Maternal Grandmother   . Diabetes Paternal Grandmother   . Hypertension Paternal Grandmother     History  Substance Use Topics  . Smoking status: Never Smoker   . Smokeless tobacco: Never Used  . Alcohol Use: No    OB History   Grav Para Term Preterm Abortions TAB SAB Ect Mult Living   2 2 1 1      2       Review of Systems  Constitutional: Negative for fever and chills.  Respiratory: Negative for shortness of breath.   Gastrointestinal: Negative for nausea and vomiting.  Neurological: Negative for weakness.  All other systems reviewed and are negative.   A complete 10 system review of systems was obtained and all  systems are negative except as noted in the HPI and PMH.   Allergies  Review of patient's allergies indicates no known allergies.  Home Medications  No current outpatient prescriptions on file.  There were no vitals taken for this visit.  Physical Exam  Nursing note and vitals reviewed. Constitutional: She is oriented to person, place, and time. She appears well-developed and well-nourished. No distress.  HENT:  Head: Normocephalic and atraumatic.  Mouth/Throat: Oropharynx is clear and moist.  Eyes: EOM are normal. Pupils are equal, round, and reactive to light.  Neck: Neck supple. No tracheal deviation present.  Cardiovascular: Normal rate and normal heart sounds.  Exam reveals no gallop and no friction rub.   No murmur heard. Pulmonary/Chest: Effort normal and breath sounds normal. No respiratory distress.  Musculoskeletal: Normal range of motion.  Neurological: She is alert and oriented to person, place, and time. She has normal strength and normal reflexes. No sensory deficit. She exhibits normal muscle tone. Coordination normal. GCS eye subscore is 4. GCS verbal subscore is 5. GCS motor subscore is 6.  Skin: Skin is warm and dry.  Psychiatric: She has a normal mood and affect. Her behavior is normal.    ED Course  Procedures (including critical care time) DIAGNOSTIC STUDIES: None at this time  COORDINATION OF CARE:  Discussed treatment plan with patient. Patient  agrees.   The patient is advised to return here as needed. Ice and heat to her back. Follow up with neurosurgery as needed. No motor or neurodeficits.   MDM  I personally performed the services described in this documentation, which was scribed in my presence. The recorded information has been reviewed and is accurate.          Carlyle Dolly, PA-C 03/12/13 1728

## 2013-07-21 ENCOUNTER — Encounter (HOSPITAL_COMMUNITY): Payer: Self-pay | Admitting: Emergency Medicine

## 2013-07-21 DIAGNOSIS — R1031 Right lower quadrant pain: Secondary | ICD-10-CM | POA: Insufficient documentation

## 2013-07-21 DIAGNOSIS — Z3202 Encounter for pregnancy test, result negative: Secondary | ICD-10-CM | POA: Insufficient documentation

## 2013-07-21 LAB — CBC WITH DIFFERENTIAL/PLATELET
Basophils Absolute: 0 10*3/uL (ref 0.0–0.1)
Basophils Relative: 1 % (ref 0–1)
Eosinophils Absolute: 0.2 10*3/uL (ref 0.0–0.7)
Eosinophils Relative: 2 % (ref 0–5)
HCT: 38.7 % (ref 36.0–46.0)
Hemoglobin: 13.9 g/dL (ref 12.0–15.0)
Lymphocytes Relative: 38 % (ref 12–46)
Lymphs Abs: 3 10*3/uL (ref 0.7–4.0)
MCH: 32.3 pg (ref 26.0–34.0)
MCHC: 35.9 g/dL (ref 30.0–36.0)
MCV: 90 fL (ref 78.0–100.0)
Monocytes Absolute: 0.6 10*3/uL (ref 0.1–1.0)
Monocytes Relative: 8 % (ref 3–12)
Neutro Abs: 4.1 10*3/uL (ref 1.7–7.7)
Neutrophils Relative %: 52 % (ref 43–77)
Platelets: 208 10*3/uL (ref 150–400)
RBC: 4.3 MIL/uL (ref 3.87–5.11)
RDW: 12.3 % (ref 11.5–15.5)
WBC: 7.9 10*3/uL (ref 4.0–10.5)

## 2013-07-21 LAB — URINALYSIS, ROUTINE W REFLEX MICROSCOPIC
Bilirubin Urine: NEGATIVE
Glucose, UA: NEGATIVE mg/dL
Ketones, ur: NEGATIVE mg/dL
Leukocytes, UA: NEGATIVE
Nitrite: NEGATIVE
Protein, ur: NEGATIVE mg/dL
Specific Gravity, Urine: 1.027 (ref 1.005–1.030)
Urobilinogen, UA: 1 mg/dL (ref 0.0–1.0)
pH: 7 (ref 5.0–8.0)

## 2013-07-21 LAB — COMPREHENSIVE METABOLIC PANEL
ALT: 12 U/L (ref 0–35)
AST: 16 U/L (ref 0–37)
Albumin: 4.1 g/dL (ref 3.5–5.2)
Alkaline Phosphatase: 58 U/L (ref 39–117)
BUN: 16 mg/dL (ref 6–23)
CO2: 27 mEq/L (ref 19–32)
Calcium: 9.6 mg/dL (ref 8.4–10.5)
Chloride: 103 mEq/L (ref 96–112)
Creatinine, Ser: 0.55 mg/dL (ref 0.50–1.10)
GFR calc Af Amer: >90 mL/min (ref >90)
GFR calc non Af Amer: >90 mL/min (ref >90)
Glucose, Bld: 88 mg/dL (ref 70–99)
Potassium: 4.1 mEq/L (ref 3.5–5.1)
Sodium: 141 mEq/L (ref 135–145)
Total Bilirubin: 0.3 mg/dL (ref 0.3–1.2)
Total Protein: 7.8 g/dL (ref 6.0–8.3)

## 2013-07-21 LAB — URINE MICROSCOPIC-ADD ON

## 2013-07-21 LAB — PREGNANCY, URINE: Preg Test, Ur: NEGATIVE

## 2013-07-21 NOTE — ED Notes (Signed)
rlq pain with nausea for 3 days.  lmp none  bc

## 2013-07-22 ENCOUNTER — Emergency Department (HOSPITAL_COMMUNITY)
Admission: EM | Admit: 2013-07-22 | Discharge: 2013-07-22 | Discharge status: Against Medical Advice | Payer: Medicaid Other | Attending: Emergency Medicine | Admitting: Emergency Medicine

## 2013-07-22 NOTE — ED Notes (Signed)
Patient called to be roomed and no answer.

## 2013-11-29 ENCOUNTER — Encounter (HOSPITAL_COMMUNITY): Payer: Self-pay | Admitting: Emergency Medicine

## 2013-11-29 ENCOUNTER — Emergency Department (HOSPITAL_COMMUNITY)
Admission: EM | Admit: 2013-11-29 | Discharge: 2013-11-29 | Disposition: A | Discharge status: Home / Self Care | Payer: Medicaid Other | Attending: Emergency Medicine | Admitting: Emergency Medicine

## 2013-11-29 DIAGNOSIS — N72 Inflammatory disease of cervix uteri: Secondary | ICD-10-CM | POA: Insufficient documentation

## 2013-11-29 DIAGNOSIS — R319 Hematuria, unspecified: Secondary | ICD-10-CM | POA: Insufficient documentation

## 2013-11-29 DIAGNOSIS — R1031 Right lower quadrant pain: Secondary | ICD-10-CM | POA: Insufficient documentation

## 2013-11-29 DIAGNOSIS — R358 Other polyuria: Secondary | ICD-10-CM | POA: Insufficient documentation

## 2013-11-29 DIAGNOSIS — R300 Dysuria: Secondary | ICD-10-CM | POA: Insufficient documentation

## 2013-11-29 DIAGNOSIS — R51 Headache: Secondary | ICD-10-CM | POA: Insufficient documentation

## 2013-11-29 DIAGNOSIS — Z87820 Personal history of traumatic brain injury: Secondary | ICD-10-CM | POA: Insufficient documentation

## 2013-11-29 DIAGNOSIS — G40909 Epilepsy, unspecified, not intractable, without status epilepticus: Secondary | ICD-10-CM | POA: Insufficient documentation

## 2013-11-29 DIAGNOSIS — R6883 Chills (without fever): Secondary | ICD-10-CM | POA: Insufficient documentation

## 2013-11-29 DIAGNOSIS — M4120 Other idiopathic scoliosis, site unspecified: Secondary | ICD-10-CM | POA: Insufficient documentation

## 2013-11-29 DIAGNOSIS — R3589 Other polyuria: Secondary | ICD-10-CM | POA: Insufficient documentation

## 2013-11-29 DIAGNOSIS — Z3202 Encounter for pregnancy test, result negative: Secondary | ICD-10-CM | POA: Insufficient documentation

## 2013-11-29 DIAGNOSIS — Z975 Presence of (intrauterine) contraceptive device: Secondary | ICD-10-CM | POA: Insufficient documentation

## 2013-11-29 LAB — COMPREHENSIVE METABOLIC PANEL WITH GFR
ALT: 8 U/L (ref 0–35)
AST: 16 U/L (ref 0–37)
Albumin: 3.8 g/dL (ref 3.5–5.2)
Alkaline Phosphatase: 48 U/L (ref 39–117)
BUN: 11 mg/dL (ref 6–23)
CO2: 27 meq/L (ref 19–32)
Calcium: 9.1 mg/dL (ref 8.4–10.5)
Chloride: 103 meq/L (ref 96–112)
Creatinine, Ser: 0.55 mg/dL (ref 0.50–1.10)
GFR calc Af Amer: >90 mL/min
GFR calc non Af Amer: >90 mL/min
Glucose, Bld: 77 mg/dL (ref 70–99)
Potassium: 4.3 meq/L (ref 3.7–5.3)
Sodium: 140 meq/L (ref 137–147)
Total Bilirubin: 0.4 mg/dL (ref 0.3–1.2)
Total Protein: 6.9 g/dL (ref 6.0–8.3)

## 2013-11-29 LAB — CBC WITH DIFFERENTIAL/PLATELET
Basophils Absolute: 0 10*3/uL (ref 0.0–0.1)
Basophils Relative: 1 % (ref 0–1)
Eosinophils Absolute: 0.1 10*3/uL (ref 0.0–0.7)
Eosinophils Relative: 1 % (ref 0–5)
HCT: 37.2 % (ref 36.0–46.0)
Hemoglobin: 12.9 g/dL (ref 12.0–15.0)
Lymphocytes Relative: 33 % (ref 12–46)
Lymphs Abs: 2.1 10*3/uL (ref 0.7–4.0)
MCH: 31.1 pg (ref 26.0–34.0)
MCHC: 34.7 g/dL (ref 30.0–36.0)
MCV: 89.6 fL (ref 78.0–100.0)
Monocytes Absolute: 0.3 10*3/uL (ref 0.1–1.0)
Monocytes Relative: 6 % (ref 3–12)
Neutro Abs: 3.7 10*3/uL (ref 1.7–7.7)
Neutrophils Relative %: 59 % (ref 43–77)
Platelets: 187 10*3/uL (ref 150–400)
RBC: 4.15 MIL/uL (ref 3.87–5.11)
RDW: 12.4 % (ref 11.5–15.5)
WBC: 6.2 10*3/uL (ref 4.0–10.5)

## 2013-11-29 LAB — URINE MICROSCOPIC-ADD ON

## 2013-11-29 LAB — URINALYSIS, ROUTINE W REFLEX MICROSCOPIC
Bilirubin Urine: NEGATIVE
Glucose, UA: NEGATIVE mg/dL
Ketones, ur: NEGATIVE mg/dL
Nitrite: NEGATIVE
Protein, ur: NEGATIVE mg/dL
Specific Gravity, Urine: 1.027 (ref 1.005–1.030)
Urobilinogen, UA: 0.2 mg/dL (ref 0.0–1.0)
pH: 5.5 (ref 5.0–8.0)

## 2013-11-29 LAB — WET PREP, GENITAL
Trich, Wet Prep: NONE SEEN
Yeast Wet Prep HPF POC: NONE SEEN

## 2013-11-29 LAB — HIV ANTIBODY (ROUTINE TESTING W REFLEX): HIV: NONREACTIVE

## 2013-11-29 LAB — POC URINE PREG, ED: Preg Test, Ur: NEGATIVE

## 2013-11-29 MED ORDER — DOXYCYCLINE HYCLATE 100 MG PO CAPS: 100.0000 mg | ORAL_CAPSULE | Freq: Two times a day (BID) | ORAL | Status: DC

## 2013-11-29 MED ORDER — AZITHROMYCIN 250 MG PO TABS
1000.0000 mg | ORAL_TABLET | Freq: Once | ORAL | Status: AC
  Administered 2013-11-29: 1000 mg via ORAL
  Filled 2013-11-29: qty 4

## 2013-11-29 MED ORDER — CEFTRIAXONE SODIUM 250 MG IJ SOLR
250.0000 mg | Freq: Once | INTRAMUSCULAR | Status: AC
  Administered 2013-11-29: 250 mg via INTRAMUSCULAR
  Filled 2013-11-29: qty 250

## 2013-11-29 MED ORDER — IBUPROFEN 800 MG PO TABS: 800.0000 mg | ORAL_TABLET | Freq: Three times a day (TID) | ORAL | Status: DC

## 2013-11-29 NOTE — ED Provider Notes (Signed)
Medical screening examination/treatment/procedure(s) were performed by non-physician practitioner and as supervising physician I was immediately available for consultation/collaboration.  Jaison Petraglia L Carina Chaplin, MD 11/29/13 1634 

## 2013-11-29 NOTE — ED Notes (Signed)
Pt. Is unable to use the restroom at this time is aware that we need a specimen.

## 2013-11-29 NOTE — ED Provider Notes (Signed)
CSN: [id number]     Arrival date & time 11/29/13  1124 History   First MD Initiated Contact with Patient 11/29/13 1207     Chief Complaint  Patient presents with  . Vaginal Bleeding  . RLQ pain      (Consider location/radiation/quality/duration/timing/severity/associated sxs/prior Treatment) HPI Comments: Pt is a 23 year-old female who presents complaining of intermittent RLQ pain for 1 week, painful urination, and and abnormal vaginal bleeding for 2 days. The RLQ pain is ranked 9/10 and occurs intermittently for several hours at a time. The patient has not tried anything to relieve this pain. She has a mirena IUD and does not usually have menstrual cycles. The ongoing vaginal bleeding contains clots and she has used 3 pads. The patient also complains of dysuria, polyuria and has seen blood in her urine since yesterday.   Patient is a 23 y.o. female presenting with vaginal bleeding. The history is provided by the patient. No language interpreter was used.  Vaginal Bleeding Quality:  Clots Chronicity:  New Menstrual history:  Irregular Number of pads used:  3 Worsened by:  Urination Ineffective treatments:  None tried Associated symptoms: abdominal pain, dysuria and vaginal discharge   Associated symptoms: no dizziness, no fever and no nausea   Abdominal pain:    Location:  RLQ   Severity:  Severe   Timing:  Intermittent   Progression:  Unchanged   Past Medical History  Diagnosis Date  . Seizures   . Closed head injury   . Scoliosis    Past Surgical History  Procedure Laterality Date  . Back surgery      screws placed for scoliosis   Family History  Problem Relation Age of Onset  . Anesthesia problems Neg Hx   . Hypertension Father   . Diabetes Maternal Grandmother   . Hypertension Maternal Grandmother   . Diabetes Paternal Grandmother   . Hypertension Paternal Grandmother    History  Substance Use Topics  . Smoking status: Never Smoker   . Smokeless tobacco: Never  Used  . Alcohol Use: No   OB History   Grav Para Term Preterm Abortions TAB SAB Ect Mult Living   2 2 1 1      2      Review of Systems  Constitutional: Positive for chills. Negative for fever.  Respiratory: Negative for chest tightness and shortness of breath.   Cardiovascular: Negative for chest pain.  Gastrointestinal: Positive for abdominal pain. Negative for nausea, vomiting, diarrhea and constipation.  Endocrine: Positive for polyuria.  Genitourinary: Positive for dysuria, vaginal bleeding and vaginal discharge.  Neurological: Positive for headaches. Negative for dizziness and light-headedness.      Allergies  Review of patient's allergies indicates no known allergies.  Home Medications  No current outpatient prescriptions on file. BP 115/63  Pulse 80  Temp(Src) 98.4 F (36.9 C) (Oral)  Resp 16  SpO2 100% Physical Exam  Constitutional: She is oriented to person, place, and time. No distress.  Cardiovascular: Normal heart sounds.  Exam reveals no gallop and no friction rub.   No murmur heard. Pulmonary/Chest: Breath sounds normal. No respiratory distress. She has no wheezes. She has no rales.  Abdominal: Soft. There is tenderness in the right lower quadrant. There is no guarding and no CVA tenderness.  RLQ tenderness.   Genitourinary: Vaginal discharge found.  Mild cervical motion tenderness. Right adnexal tenderness without mass. No left adnexal tenderness. No cervical bleeding present.   Neurological: She is alert and oriented to  person, place, and time.  Skin: Skin is warm and dry. She is not diaphoretic.  Recent tattoo to suprapubic region.    ED Course  Procedures (including critical care time) Labs Review Labs Reviewed  URINALYSIS, ROUTINE W REFLEX MICROSCOPIC - Abnormal; Notable for the following:    APPearance CLOUDY (*)    Hgb urine dipstick LARGE (*)    Leukocytes, UA MODERATE (*)    All other components within normal limits  URINE MICROSCOPIC-ADD  ON - Abnormal; Notable for the following:    Bacteria, UA MANY (*)    All other components within normal limits  CBC WITH DIFFERENTIAL  COMPREHENSIVE METABOLIC PANEL  POC URINE PREG, ED   Imaging Review No results found.   EKG Interpretation None      MDM   Final diagnoses:  None    1. Cervicitis  RLQ abdominal tenderness without leukocytosis, for prolonged duration (6-7 days) without fever or anorexia. She has vaginal findings, CMT, TNTC WBC's on wet prep and adnexal tenderness on the right. Favor pelvic process over appendicitis. Treated for STD including Doxycycline x 7 days. Patient is non-toxic in appearance. Stable for discharge.     Arnoldo Hooker, PA-C 11/29/13 1531

## 2013-11-29 NOTE — Discharge Instructions (Signed)
Cervicitis °Cervicitis is a soreness and swelling (inflammation) of the cervix. Your cervix is located at the bottom of your uterus. It opens up to the vagina. °CAUSES  °· Sexually transmitted infections (STIs).   °· Allergic reaction.   °· Medicines or birth control devices that are put in the vagina.   °· Injury to the cervix.   °· Bacterial infections.   °RISK FACTORS °You are at greater risk if you: °· Have unprotected sexual intercourse. °· Have sexual intercourse with many partners. °· Began sexual intercourse at an early age. °· Have a history of STIs. °SYMPTOMS  °There may be no symptoms. If symptoms occur, they may include:  °· Grey, white, yellow, or bad-smelling vaginal discharge.   °· Pain or itching of the area outside the vagina.   °· Painful sexual intercourse.   °· Lower abdominal or lower back pain, especially during intercourse.   °· Frequent urination.   °· Abnormal vaginal bleeding between periods, after sexual intercourse, or after menopause.   °· Pressure or a heavy feeling in the pelvis.   °DIAGNOSIS  °Diagnosis is made after a pelvic exam. Other tests may include:  °· Examination of any discharge under a microscope (wet prep).   °· A Pap test.   °TREATMENT  °Treatment will depend on the cause of cervicitis. If it is caused by an STI, both you and your partner will need to be treated. Antibiotic medicines will be given.  °HOME CARE INSTRUCTIONS  °· Do not have sexual intercourse until your health care provider says it is okay.   °· Do not have sexual intercourse until your partner has been treated, if your cervicitis is caused by an STI.   °· Take your antibiotics as directed. Finish them even if you start to feel better.   °SEEK MEDICAL CARE IF: °· Your symptoms come back.   °· You have a fever.   °MAKE SURE YOU:  °· Understand these instructions. °· Will watch your condition. °· Will get help right away if you are not doing well or get worse. °Document Released: 09/09/2005 Document Revised:  05/12/2013 Document Reviewed: 03/03/2013 °Exit[employer]® Patient Information ©2014 [employer]. ° °

## 2013-11-29 NOTE — Progress Notes (Signed)
[facility] provided pt with a list of primary care resources and a [facility] Orange Card application to help patient establish primary care.  °

## 2013-11-29 NOTE — ED Notes (Signed)
Pt states she has abdominal pain in her RLQ on and off for the last several months. Pt states her urine began looking orange in color and with "meaty" things in yesterday.

## 2013-11-29 NOTE — ED Notes (Signed)
Pt c/o RLQ pain times one week. PT also states that last night she had vaginal bleeding with blood clots, "they weren't red they were orange color". Pt has IUD and hasn't had menstrual cycle in apprx 6 months. Pt states she isnt currently bleeding this morning but still having the RLQ pain

## 2013-11-30 LAB — GC/CHLAMYDIA PROBE AMP
CT Probe RNA: POSITIVE — AB
GC Probe RNA: NEGATIVE

## 2013-12-02 ENCOUNTER — Telehealth (HOSPITAL_COMMUNITY): Payer: Self-pay | Admitting: *Deleted

## 2013-12-02 NOTE — ED Notes (Signed)
Patient informed of positive results after id'd x 2 and informed of need to notify partner to be treated. 

## 2013-12-02 NOTE — ED Notes (Signed)
+   Chlamydia Patient treated with Rocephin And Zithromax-[facility] faxed 

## 2014-02-06 ENCOUNTER — Emergency Department (HOSPITAL_COMMUNITY)
Admission: EM | Admit: 2014-02-06 | Discharge: 2014-02-06 | Disposition: A | Discharge status: Home / Self Care | Payer: Medicaid Other | Attending: Emergency Medicine | Admitting: Emergency Medicine

## 2014-02-06 ENCOUNTER — Encounter (HOSPITAL_COMMUNITY): Payer: Self-pay | Admitting: Emergency Medicine

## 2014-02-06 ENCOUNTER — Emergency Department (HOSPITAL_COMMUNITY): Payer: Medicaid Other

## 2014-02-06 DIAGNOSIS — Z791 Long term (current) use of non-steroidal anti-inflammatories (NSAID): Secondary | ICD-10-CM | POA: Insufficient documentation

## 2014-02-06 DIAGNOSIS — H53149 Visual discomfort, unspecified: Secondary | ICD-10-CM | POA: Insufficient documentation

## 2014-02-06 DIAGNOSIS — R519 Headache, unspecified: Secondary | ICD-10-CM

## 2014-02-06 DIAGNOSIS — M436 Torticollis: Secondary | ICD-10-CM | POA: Insufficient documentation

## 2014-02-06 DIAGNOSIS — R51 Headache: Secondary | ICD-10-CM | POA: Insufficient documentation

## 2014-02-06 DIAGNOSIS — G8929 Other chronic pain: Secondary | ICD-10-CM | POA: Insufficient documentation

## 2014-02-06 DIAGNOSIS — G40909 Epilepsy, unspecified, not intractable, without status epilepticus: Secondary | ICD-10-CM | POA: Insufficient documentation

## 2014-02-06 DIAGNOSIS — Z792 Long term (current) use of antibiotics: Secondary | ICD-10-CM | POA: Insufficient documentation

## 2014-02-06 DIAGNOSIS — R000 Tachycardia, unspecified: Secondary | ICD-10-CM | POA: Insufficient documentation

## 2014-02-06 DIAGNOSIS — R569 Unspecified convulsions: Secondary | ICD-10-CM

## 2014-02-06 DIAGNOSIS — Z87828 Personal history of other (healed) physical injury and trauma: Secondary | ICD-10-CM | POA: Insufficient documentation

## 2014-02-06 DIAGNOSIS — H538 Other visual disturbances: Secondary | ICD-10-CM | POA: Insufficient documentation

## 2014-02-06 DIAGNOSIS — R6883 Chills (without fever): Secondary | ICD-10-CM | POA: Insufficient documentation

## 2014-02-06 DIAGNOSIS — Z3202 Encounter for pregnancy test, result negative: Secondary | ICD-10-CM | POA: Insufficient documentation

## 2014-02-06 LAB — COMPREHENSIVE METABOLIC PANEL
ALT: 12 U/L (ref 0–35)
AST: 18 U/L (ref 0–37)
Albumin: 4.5 g/dL (ref 3.5–5.2)
Alkaline Phosphatase: 50 U/L (ref 39–117)
BUN: 10 mg/dL (ref 6–23)
CO2: 17 mEq/L — ABNORMAL LOW (ref 19–32)
Calcium: 9.8 mg/dL (ref 8.4–10.5)
Chloride: 102 mEq/L (ref 96–112)
Creatinine, Ser: 0.59 mg/dL (ref 0.50–1.10)
GFR calc Af Amer: >90 mL/min (ref >90)
GFR calc non Af Amer: >90 mL/min (ref >90)
Glucose, Bld: 100 mg/dL — ABNORMAL HIGH (ref 70–99)
Potassium: 3.4 mEq/L — ABNORMAL LOW (ref 3.7–5.3)
Sodium: 140 mEq/L (ref 137–147)
Total Bilirubin: 0.6 mg/dL (ref 0.3–1.2)
Total Protein: 7.9 g/dL (ref 6.0–8.3)

## 2014-02-06 LAB — RAPID URINE DRUG SCREEN, HOSP PERFORMED
Amphetamines: NOT DETECTED
Barbiturates: NOT DETECTED
Benzodiazepines: NOT DETECTED
Cocaine: NOT DETECTED
Opiates: NOT DETECTED
Tetrahydrocannabinol: NOT DETECTED

## 2014-02-06 LAB — CBC WITH DIFFERENTIAL/PLATELET
Basophils Absolute: 0 10*3/uL (ref 0.0–0.1)
Basophils Relative: 0 % (ref 0–1)
Eosinophils Absolute: 0 10*3/uL (ref 0.0–0.7)
Eosinophils Relative: 0 % (ref 0–5)
HCT: 40.6 % (ref 36.0–46.0)
Hemoglobin: 14.1 g/dL (ref 12.0–15.0)
Lymphocytes Relative: 31 % (ref 12–46)
Lymphs Abs: 3.2 10*3/uL (ref 0.7–4.0)
MCH: 31.2 pg (ref 26.0–34.0)
MCHC: 34.7 g/dL (ref 30.0–36.0)
MCV: 89.8 fL (ref 78.0–100.0)
Monocytes Absolute: 0.9 10*3/uL (ref 0.1–1.0)
Monocytes Relative: 8 % (ref 3–12)
Neutro Abs: 6.3 10*3/uL (ref 1.7–7.7)
Neutrophils Relative %: 61 % (ref 43–77)
Platelets: 195 10*3/uL (ref 150–400)
RBC: 4.52 MIL/uL (ref 3.87–5.11)
RDW: 12.3 % (ref 11.5–15.5)
WBC: 10.4 10*3/uL (ref 4.0–10.5)

## 2014-02-06 LAB — URINE MICROSCOPIC-ADD ON

## 2014-02-06 LAB — URINALYSIS, ROUTINE W REFLEX MICROSCOPIC
Bilirubin Urine: NEGATIVE
Glucose, UA: NEGATIVE mg/dL
Ketones, ur: 15 mg/dL — AB
Leukocytes, UA: NEGATIVE
Nitrite: NEGATIVE
Protein, ur: NEGATIVE mg/dL
Specific Gravity, Urine: 1.031 — ABNORMAL HIGH (ref 1.005–1.030)
Urobilinogen, UA: 0.2 mg/dL (ref 0.0–1.0)
pH: 5.5 (ref 5.0–8.0)

## 2014-02-06 LAB — CBG MONITORING, ED: Glucose-Capillary: 91 mg/dL (ref 70–99)

## 2014-02-06 LAB — ETHANOL: Alcohol, Ethyl (B): <11 mg/dL (ref 0–11)

## 2014-02-06 LAB — POC URINE PREG, ED: Preg Test, Ur: NEGATIVE

## 2014-02-06 MED ORDER — PROMETHAZINE HCL 25 MG/ML IJ SOLN
25.0000 mg | Freq: Once | INTRAMUSCULAR | Status: DC
  Filled 2014-02-06: qty 1

## 2014-02-06 MED ORDER — SODIUM CHLORIDE 0.9 % IV BOLUS (SEPSIS)
1000.0000 mL | Freq: Once | INTRAVENOUS | Status: AC
  Administered 2014-02-06: 1000 mL via INTRAVENOUS

## 2014-02-06 MED ORDER — DIPHENHYDRAMINE HCL 50 MG/ML IJ SOLN
25.0000 mg | Freq: Once | INTRAMUSCULAR | Status: AC
  Administered 2014-02-06: 25 mg via INTRAVENOUS
  Filled 2014-02-06: qty 1

## 2014-02-06 MED ORDER — LORAZEPAM 2 MG/ML IJ SOLN
INTRAMUSCULAR | Status: AC
  Administered 2014-02-06: 1 mg via INTRAVENOUS
  Filled 2014-02-06: qty 1

## 2014-02-06 MED ORDER — ONDANSETRON HCL 4 MG/2ML IJ SOLN: 4.0000 mg | Freq: Once | INTRAMUSCULAR | Status: DC

## 2014-02-06 MED ORDER — MORPHINE SULFATE 4 MG/ML IJ SOLN: 4.0000 mg | Freq: Once | INTRAMUSCULAR | Status: DC

## 2014-02-06 MED ORDER — METOCLOPRAMIDE HCL 5 MG/ML IJ SOLN
10.0000 mg | Freq: Once | INTRAMUSCULAR | Status: AC
  Administered 2014-02-06: 10 mg via INTRAVENOUS
  Filled 2014-02-06: qty 2

## 2014-02-06 MED ORDER — BUTALBITAL-APAP-CAFFEINE 50-325-40 MG PO TABS: 1.0000 | ORAL_TABLET | Freq: Four times a day (QID) | ORAL | Status: DC | PRN

## 2014-02-06 MED ORDER — LORAZEPAM 2 MG/ML IJ SOLN
1.0000 mg | Freq: Once | INTRAMUSCULAR | Status: DC
  Administered 2014-02-06: 1 mg via INTRAVENOUS
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
## 2014-02-06 NOTE — Discharge Instructions (Signed)
Please follow up with neurologist for further management of your seizure.  Take fioricet for your headache and follow up with a regular doctor for management of your health.  Return if your symptoms worsen or if you have other concerns.    Headaches, Frequently Asked Questions MIGRAINE HEADACHES Q: What is migraine? What causes it? How can I treat it? A: Generally, migraine headaches begin as a dull ache. Then they develop into a constant, throbbing, and pulsating pain. You may experience pain at the temples. You may experience pain at the front or back of one or both sides of the head. The pain is usually accompanied by a combination of:  Nausea.  Vomiting.  Sensitivity to light and noise. Some people (about 15%) experience an aura (see below) before an attack. The cause of migraine is believed to be chemical reactions in the brain. Treatment for migraine may include over-the-counter or prescription medications. It may also include self-help techniques. These include relaxation training and biofeedback.  Q: What is an aura? A: About 15% of people with migraine get an "aura". This is a sign of neurological symptoms that occur before a migraine headache. You may see wavy or jagged lines, dots, or flashing lights. You might experience tunnel vision or blind spots in one or both eyes. The aura can include visual or auditory hallucinations (something imagined). It may include disruptions in smell (such as strange odors), taste or touch. Other symptoms include:  Numbness.  A "pins and needles" sensation.  Difficulty in recalling or speaking the correct word. These neurological events may last as long as 60 minutes. These symptoms will fade as the headache begins. Q: What is a trigger? A: Certain physical or environmental factors can lead to or "trigger" a migraine. These include:  Foods.  Hormonal changes.  Weather.  Stress. It is important to remember that triggers are different for  everyone. To help prevent migraine attacks, you need to figure out which triggers affect you. Keep a headache diary. This is a good way to track triggers. The diary will help you talk to your healthcare professional about your condition. Q: Does weather affect migraines? A: Bright sunshine, hot, humid conditions, and drastic changes in barometric pressure may lead to, or "trigger," a migraine attack in some people. But studies have shown that weather does not act as a trigger for everyone with migraines. Q: What is the link between migraine and hormones? A: Hormones start and regulate many of your body's functions. Hormones keep your body in balance within a constantly changing environment. The levels of hormones in your body are unbalanced at times. Examples are during menstruation, pregnancy, or menopause. That can lead to a migraine attack. In fact, about three quarters of all women with migraine report that their attacks are related to the menstrual cycle.  Q: Is there an increased risk of stroke for migraine sufferers? A: The likelihood of a migraine attack causing a stroke is very remote. That is not to say that migraine sufferers cannot have a stroke associated with their migraines. In persons under age 23, the most common associated factor for stroke is migraine headache. But over the course of a person's normal life span, the occurrence of migraine headache may actually be associated with a reduced risk of dying from cerebrovascular disease due to stroke.  Q: What are acute medications for migraine? A: Acute medications are used to treat the pain of the headache after it has started. Examples over-the-counter medications, NSAIDs, ergots, and  triptans.  Q: What are the triptans? A: Triptans are the newest class of abortive medications. They are specifically targeted to treat migraine. Triptans are vasoconstrictors. They moderate some chemical reactions in the brain. The triptans work on receptors  in your brain. Triptans help to restore the balance of a neurotransmitter called serotonin. Fluctuations in levels of serotonin are thought to be a main cause of migraine.  Q: Are over-the-counter medications for migraine effective? A: Over-the-counter, or "OTC," medications may be effective in relieving mild to moderate pain and associated symptoms of migraine. But you should see your caregiver before beginning any treatment regimen for migraine.  Q: What are preventive medications for migraine? A: Preventive medications for migraine are sometimes referred to as "prophylactic" treatments. They are used to reduce the frequency, severity, and length of migraine attacks. Examples of preventive medications include antiepileptic medications, antidepressants, beta-blockers, calcium channel blockers, and NSAIDs (nonsteroidal anti-inflammatory drugs). Q: Why are anticonvulsants used to treat migraine? A: During the past few years, there has been an increased interest in antiepileptic drugs for the prevention of migraine. They are sometimes referred to as "anticonvulsants". Both epilepsy and migraine may be caused by similar reactions in the brain.  Q: Why are antidepressants used to treat migraine? A: Antidepressants are typically used to treat people with depression. They may reduce migraine frequency by regulating chemical levels, such as serotonin, in the brain.  Q: What alternative therapies are used to treat migraine? A: The term "alternative therapies" is often used to describe treatments considered outside the scope of conventional Western medicine. Examples of alternative therapy include acupuncture, acupressure, and yoga. Another common alternative treatment is herbal therapy. Some herbs are believed to relieve headache pain. Always discuss alternative therapies with your caregiver before proceeding. Some herbal products contain arsenic and other toxins. TENSION HEADACHES Q: What is a tension-type  headache? What causes it? How can I treat it? A: Tension-type headaches occur randomly. They are often the result of temporary stress, anxiety, fatigue, or anger. Symptoms include soreness in your temples, a tightening band-like sensation around your head (a "vice-like" ache). Symptoms can also include a pulling feeling, pressure sensations, and contracting head and neck muscles. The headache begins in your forehead, temples, or the back of your head and neck. Treatment for tension-type headache may include over-the-counter or prescription medications. Treatment may also include self-help techniques such as relaxation training and biofeedback. CLUSTER HEADACHES Q: What is a cluster headache? What causes it? How can I treat it? A: Cluster headache gets its name because the attacks come in groups. The pain arrives with little, if any, warning. It is usually on one side of the head. A tearing or bloodshot eye and a runny nose on the same side of the headache may also accompany the pain. Cluster headaches are believed to be caused by chemical reactions in the brain. They have been described as the most severe and intense of any headache type. Treatment for cluster headache includes prescription medication and oxygen. SINUS HEADACHES Q: What is a sinus headache? What causes it? How can I treat it? A: When a cavity in the bones of the face and skull (a sinus) becomes inflamed, the inflammation will cause localized pain. This condition is usually the result of an allergic reaction, a tumor, or an infection. If your headache is caused by a sinus blockage, such as an infection, you will probably have a fever. An x-ray will confirm a sinus blockage. Your caregiver's treatment might include antibiotics for  the infection, as well as antihistamines or decongestants.  REBOUND HEADACHES Q: What is a rebound headache? What causes it? How can I treat it? A: A pattern of taking acute headache medications too often can lead  to a condition known as "rebound headache." A pattern of taking too much headache medication includes taking it more than 2 days per week or in excessive amounts. That means more than the label or a caregiver advises. With rebound headaches, your medications not only stop relieving pain, they actually begin to cause headaches. Doctors treat rebound headache by tapering the medication that is being overused. Sometimes your caregiver will gradually substitute a different type of treatment or medication. Stopping may be a challenge. Regularly overusing a medication increases the potential for serious side effects. Consult a caregiver if you regularly use headache medications more than 2 days per week or more than the label advises. ADDITIONAL QUESTIONS AND ANSWERS Q: What is biofeedback? A: Biofeedback is a self-help treatment. Biofeedback uses special equipment to monitor your body's involuntary physical responses. Biofeedback monitors:  Breathing.  Pulse.  Heart rate.  Temperature.  Muscle tension.  Brain activity. Biofeedback helps you refine and perfect your relaxation exercises. You learn to control the physical responses that are related to stress. Once the technique has been mastered, you do not need the equipment any more. Q: Are headaches hereditary? A: Four out of five (80%) of people that suffer report a family history of migraine. Scientists are not sure if this is genetic or a family predisposition. Despite the uncertainty, a child has a 50% chance of having migraine if one parent suffers. The child has a 75% chance if both parents suffer.  Q: Can children get headaches? A: By the time they reach high school, most young people have experienced some type of headache. Many safe and effective approaches or medications can prevent a headache from occurring or stop it after it has begun.  Q: What type of doctor should I see to diagnose and treat my headache? A: Start with your primary  caregiver. Discuss his or her experience and approach to headaches. Discuss methods of classification, diagnosis, and treatment. Your caregiver may decide to recommend you to a headache specialist, depending upon your symptoms or other physical conditions. Having diabetes, allergies, etc., may require a more comprehensive and inclusive approach to your headache. The National Headache Foundation will provide, upon request, a list of [place] physician members in your state. Document Released: 11/30/2003 Document Revised: 12/02/2011 Document Reviewed: 05/09/2008 [facility] Patient Information 2014 [place], [place].  Seizure, Adult A seizure is abnormal electrical activity in the brain. Seizures usually last from 30 seconds to 2 minutes. There are various types of seizures. Before a seizure, you may have a warning sensation (aura) that a seizure is about to occur. An aura may include the following symptoms:   Fear or anxiety.  Nausea.  Feeling like the room is spinning (vertigo).  Vision changes, such as seeing flashing lights or spots. Common symptoms during a seizure include:  A change in attention or behavior (altered mental status).  Convulsions with rhythmic jerking movements.  Drooling.  Rapid eye movements.  Grunting.  Loss of bladder and bowel control.  Bitter taste in the mouth.  Tongue biting. After a seizure, you may feel confused and sleepy. You may also have an injury resulting from convulsions during the seizure. HOME CARE INSTRUCTIONS   If you are given medicines, take them exactly as prescribed by your health care provider.  Keep all follow-up appointments as directed by your health care provider.  Do not swim or drive or engage in risky activity during which a seizure could cause further injury to you or others until your health care provider says it is OK.  Get adequate rest.  Teach friends and family what to do if you have a seizure. They should:  Lay you on the  ground to prevent a fall.  Put a cushion under your head.  Loosen any tight clothing around your neck.  Turn you on your side. If vomiting occurs, this helps keep your airway clear.  Stay with you until you recover.  Know whether or not you need emergency care. SEEK IMMEDIATE MEDICAL CARE IF:  The seizure lasts longer than 5 minutes.  The seizure is severe or you do not wake up immediately after the seizure.  You have an altered mental status after the seizure.  You are having more frequent or worsening seizures. Someone should drive you to the emergency department or call local emergency services (911 in [place] MAKE SURE YOU:  Understand these instructions.  Will watch your condition.  Will get help right away if you are not doing well or get worse. Document Released: 09/06/2000 Document Revised: 06/30/2013 Document Reviewed: 04/21/2013 [facility] Patient Information 2014 [place], [place].
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
## 2014-02-06 NOTE — ED Notes (Signed)
Pastor at bedside. Pt verbally gave consent for pastor to have information. He is in contact with patient's mother.

## 2014-02-07 NOTE — ED Provider Notes (Signed)
Medical screening examination/treatment/procedure(s) were conducted as a shared visit with non-physician practitioner(s) and myself.  I personally evaluated the patient during the encounter.   EKG Interpretation None     Patient with acute on chronic headaches. Also possible history of seizure disorders. Laboratories and imaging reassuring. Will discharge home  Juliet Rudeathan R. Rubin PayorPickering, MD 02/07/14 [id number]

## 2014-03-02 ENCOUNTER — Emergency Department (HOSPITAL_COMMUNITY): Payer: Medicaid Other

## 2014-03-02 ENCOUNTER — Encounter (HOSPITAL_COMMUNITY): Payer: Self-pay | Admitting: Emergency Medicine

## 2014-03-02 ENCOUNTER — Emergency Department (HOSPITAL_COMMUNITY)
Admission: EM | Admit: 2014-03-02 | Discharge: 2014-03-02 | Disposition: A | Discharge status: Home / Self Care | Payer: Medicaid Other | Attending: Emergency Medicine | Admitting: Emergency Medicine

## 2014-03-02 DIAGNOSIS — S46909A Unspecified injury of unspecified muscle, fascia and tendon at shoulder and upper arm level, unspecified arm, initial encounter: Secondary | ICD-10-CM | POA: Insufficient documentation

## 2014-03-02 DIAGNOSIS — Z87828 Personal history of other (healed) physical injury and trauma: Secondary | ICD-10-CM | POA: Insufficient documentation

## 2014-03-02 DIAGNOSIS — W108XXA Fall (on) (from) other stairs and steps, initial encounter: Secondary | ICD-10-CM | POA: Insufficient documentation

## 2014-03-02 DIAGNOSIS — Y929 Unspecified place or not applicable: Secondary | ICD-10-CM | POA: Insufficient documentation

## 2014-03-02 DIAGNOSIS — Y939 Activity, unspecified: Secondary | ICD-10-CM | POA: Insufficient documentation

## 2014-03-02 DIAGNOSIS — Z8739 Personal history of other diseases of the musculoskeletal system and connective tissue: Secondary | ICD-10-CM | POA: Insufficient documentation

## 2014-03-02 DIAGNOSIS — S4990XA Unspecified injury of shoulder and upper arm, unspecified arm, initial encounter: Secondary | ICD-10-CM

## 2014-03-02 DIAGNOSIS — Z8669 Personal history of other diseases of the nervous system and sense organs: Secondary | ICD-10-CM | POA: Insufficient documentation

## 2014-03-02 DIAGNOSIS — Z79899 Other long term (current) drug therapy: Secondary | ICD-10-CM | POA: Insufficient documentation

## 2014-03-02 DIAGNOSIS — S4980XA Other specified injuries of shoulder and upper arm, unspecified arm, initial encounter: Secondary | ICD-10-CM | POA: Insufficient documentation

## 2014-03-02 MED ORDER — METHOCARBAMOL 500 MG PO TABS: 1000.0000 mg | ORAL_TABLET | Freq: Four times a day (QID) | ORAL | Status: DC

## 2014-03-02 MED ORDER — NAPROXEN 500 MG PO TABS: 500.0000 mg | ORAL_TABLET | Freq: Two times a day (BID) | ORAL | Status: DC

## 2014-03-02 NOTE — ED Notes (Signed)
Initial Contact - pt A+Ox4, reports slipped and slid down a couple outdoor steps yesterday.  Pt c/o 10/10 pain to L shoulder and back.  Pt reports back sx for scoliosis "and the pain is in the same area".  Pt denies hitting head or LOC.  Bruising noted to LUE.  Pt denies n/t to extremities, denies other complaints.  Skin otherwise PWD.  Ambulatory with steady gait.  NAD.

## 2014-03-02 NOTE — Discharge Instructions (Signed)
Please read and follow all provided instructions.  Your diagnoses today include:  1. Shoulder injury     Tests performed today include:  Vital signs - see below for your results today  Medications prescribed:   Robaxin (methocarbamol) - muscle relaxer medication  DO NOT drive or perform any activities that require you to be awake and alert because this medicine can make you drowsy.    Naproxen - anti-inflammatory pain medication  Do not exceed 500mg  naproxen every 12 hours, take with food  You have been prescribed an anti-inflammatory medication or NSAID. Take with food. Take smallest effective dose for the shortest duration needed for your pain. Stop taking if you experience stomach pain or vomiting.   Take any prescribed medications only as directed.  Home care instructions:   Follow any educational materials contained in this packet  Please rest, use ice or heat on your back for the next several days  Do not lift, push, pull anything more than 10 pounds for the next week  Follow-up instructions: Please follow-up with your primary care provider in the next 1 week for further evaluation of your symptoms. If you do not have a primary care doctor -- see below for referral information.   Return instructions:  SEEK IMMEDIATE MEDICAL ATTENTION IF YOU HAVE:  New numbness, tingling, weakness, or problem with the use of your arms or legs  Severe pain not relieved with medications  Loss control of your bowels or bladder  Increasing pain in any areas of the body (such as chest or abdominal pain)  Shortness of breath, dizziness, or fainting.   Worsening nausea (feeling sick to your stomach), vomiting, fever, or sweats  Any other emergent concerns regarding your health   Additional Information:  Your vital signs today were: BP 107/65   Pulse 90   Temp(Src) 98.1 F (36.7 C) (Oral)   Resp 16   SpO2 100%   LMP 01/07/2014 If your blood pressure (BP) was elevated above  135/85 this visit, please have this repeated by your doctor within one month. --------------

## 2014-03-02 NOTE — ED Notes (Signed)
Pt had fall yesterday landing on left side. C/o left shoulder pain, back and neck pain.

## 2014-03-02 NOTE — ED Provider Notes (Signed)
CSN: [id number]     Arrival date & time 03/02/14  1744 History   This chart was scribed for Erin Price, working with Erin Price PayorPickering, MD by Chestine SporeSoijett Blue, ED Scribe. The patient was seen in room WTR8/WTR8 at 7:14 PM.   Chief Complaint  Patient presents with  . Fall   HPI HPI Comments: Erin Price is a 23 y.o. female who presents to the Emergency Department complaining of a fall and subsequent left shoulder, back, and neck pain occurring yesterday. Pt states that she was not paying attention and fell down two steps and tried to stop the fall with her outstretched left hand. Pt states that she didn't hit her head and denies LOC. Pt states that the pain is worsened by turning her head and moving her left arm. Pt states that she has not used any medications or applied heat and ice to the affected areas. Pt denies numbness, tingling and any other associated symptoms. Pt states that she has a h/o back surgery.   Past Medical History  Diagnosis Date  . Seizures   . Closed head injury   . Scoliosis    Past Surgical History  Procedure Laterality Date  . Back surgery      screws placed for scoliosis   Family History  Problem Relation Age of Onset  . Anesthesia problems Neg Hx   . Hypertension Father   . Diabetes Maternal Grandmother   . Hypertension Maternal Grandmother   . Diabetes Paternal Grandmother   . Hypertension Paternal Grandmother    History  Substance Use Topics  . Smoking status: Never Smoker   . Smokeless tobacco: Never Used  . Alcohol Use: No   OB History   Grav Para Term Preterm Abortions TAB SAB Ect Mult Living   2 2 1 1      2      Review of Systems  Constitutional: Negative for fever and unexpected weight change.  Gastrointestinal: Negative for constipation.       Negative for fecal incontinence.   Genitourinary: Negative for dysuria, hematuria, flank pain, vaginal bleeding, vaginal discharge and pelvic pain.       Negative for urinary incontinence or  retention.  Musculoskeletal: Positive for arthralgias (left shoulder), back pain and neck pain.  Neurological: Negative for weakness and numbness.       Denies saddle paresthesias.      Allergies  Review of patient's allergies indicates no known allergies.  Home Medications   Prior to Admission medications   Medication Sig Start Date End Date Taking? Authorizing Provider  Acetaminophen (TYLENOL PO) Take 2 tablets by mouth once.    Historical Provider, MD  butalbital-acetaminophen-caffeine (FIORICET) [phone] MG per tablet Take 1-2 tablets by mouth every 6 (six) hours as needed for headache. 02/06/14 02/06/15  Fayrene HelperBowie Tran, PA-C   BP 107/65  Pulse 90  Temp(Src) 98.1 F (36.7 C) (Oral)  Resp 16  SpO2 100%  LMP 01/07/2014  Physical Exam  Nursing note and vitals reviewed. Constitutional: She appears well-developed and well-nourished. No distress.  HENT:  Head: Normocephalic and atraumatic.  Eyes: Conjunctivae and EOM are normal.  Neck: Normal range of motion. Neck supple. No tracheal deviation present.  Cardiovascular: Normal rate.   Pulmonary/Chest: Effort normal. No respiratory distress.  Abdominal: Soft. There is no tenderness. There is no CVA tenderness.  Musculoskeletal: Normal range of motion.       Right shoulder: Normal.       Left shoulder: She exhibits tenderness. She exhibits  normal range of motion and no deformity.       Cervical back: She exhibits tenderness. She exhibits no bony tenderness.       Thoracic back: She exhibits tenderness. She exhibits no bony tenderness.       Lumbar back: She exhibits no tenderness and no bony tenderness.  Paraspinous tenderness to the Cervical and Thoracic spine. Tenderness over the posterior left shoulder. Full range of motion. Strength and sensation intact.   Neurological: She is alert. She has normal strength and normal reflexes. No sensory deficit.  5/5 strength in entire lower extremities bilaterally. No sensation deficit.    Skin: Skin is warm and dry. No rash noted.  Psychiatric: She has a normal mood and affect. Her behavior is normal.    ED Course  Procedures (including critical care time)  DIAGNOSTIC STUDIES: Oxygen Saturation is 100% on room air, normal by my interpretation.    COORDINATION OF CARE: 7:18 PM-Discussed treatment plan which includes anti-inflammatory medications and a muscle relaxer with pt at bedside and pt agreed to plan.   Labs Review Labs Reviewed - No data to display  Imaging Review Dg Shoulder Left  03/02/2014   CLINICAL DATA:  Fall  EXAM: LEFT SHOULDER - 2+ VIEW  COMPARISON:  None.  FINDINGS: There is no evidence of fracture or dislocation. There is no evidence of arthropathy or other focal bone abnormality. Soft tissues are unremarkable.  IMPRESSION: Negative.   Electronically Signed   By: Marlan Palau M.D.   On: 03/02/2014 18:27     EKG Interpretation None      Patient seen and examined. X-ray findings reviewed and patient informed.    Vital signs reviewed and are as follows: Filed Vitals:   03/02/14 1833  BP: 107/65  Pulse: 90  Temp: 98.1 F (36.7 C)  Resp: 16   Will treat pain with NSAIDs and muscle spasm with Robaxin. Patient counseled on rice protocol, followup if not improving in a week.  Patient counseled on proper use of muscle relaxant medication.  They were told not to drink alcohol, drive any vehicle, or do any dangerous activities while taking this medication.  Patient verbalized understanding.  MDM   Final diagnoses:  Shoulder injury   Patient with shoulder and neck and upper back pain after a fall on outstretched left arm. X-rays negative. Upper extremities are neurovascularly intact. Patient has good range of motion. Do not suspect occult fracture or significant nerve injury. Conservative management indicated.  I personally performed the services described in this documentation, which was scribed in my presence. The recorded information has  been reviewed and is accurate.    Renne Crigler, PA-C 03/02/14 1943

## 2014-03-05 NOTE — ED Provider Notes (Signed)
Medical screening examination/treatment/procedure(s) were performed by non-physician practitioner and as supervising physician I was immediately available for consultation/collaboration.   EKG Interpretation None       Evalena Fujii R. Jilene Spohr, MD 03/05/14 0702 

## 2014-03-27 ENCOUNTER — Emergency Department (HOSPITAL_COMMUNITY)
Admission: EM | Admit: 2014-03-27 | Discharge: 2014-03-28 | Disposition: A | Discharge status: Home / Self Care | Payer: Medicaid Other | Attending: Emergency Medicine | Admitting: Emergency Medicine

## 2014-03-27 ENCOUNTER — Encounter (HOSPITAL_COMMUNITY): Payer: Self-pay | Admitting: Emergency Medicine

## 2014-03-27 DIAGNOSIS — Z79899 Other long term (current) drug therapy: Secondary | ICD-10-CM | POA: Insufficient documentation

## 2014-03-27 DIAGNOSIS — R112 Nausea with vomiting, unspecified: Secondary | ICD-10-CM

## 2014-03-27 DIAGNOSIS — Z87820 Personal history of traumatic brain injury: Secondary | ICD-10-CM | POA: Insufficient documentation

## 2014-03-27 DIAGNOSIS — J45909 Unspecified asthma, uncomplicated: Secondary | ICD-10-CM | POA: Insufficient documentation

## 2014-03-27 DIAGNOSIS — Z8669 Personal history of other diseases of the nervous system and sense organs: Secondary | ICD-10-CM | POA: Insufficient documentation

## 2014-03-27 DIAGNOSIS — Z8739 Personal history of other diseases of the musculoskeletal system and connective tissue: Secondary | ICD-10-CM | POA: Insufficient documentation

## 2014-03-27 DIAGNOSIS — Z3202 Encounter for pregnancy test, result negative: Secondary | ICD-10-CM | POA: Insufficient documentation

## 2014-03-27 HISTORY — DX: Unspecified asthma, uncomplicated: J45.909

## 2014-03-27 LAB — COMPREHENSIVE METABOLIC PANEL
ALT: 9 U/L (ref 0–35)
AST: 17 U/L (ref 0–37)
Albumin: 3.8 g/dL (ref 3.5–5.2)
Alkaline Phosphatase: 51 U/L (ref 39–117)
Anion gap: 11 (ref 5–15)
BUN: 13 mg/dL (ref 6–23)
CO2: 25 mEq/L (ref 19–32)
Calcium: 9.4 mg/dL (ref 8.4–10.5)
Chloride: 103 mEq/L (ref 96–112)
Creatinine, Ser: 0.59 mg/dL (ref 0.50–1.10)
GFR calc Af Amer: >90 mL/min (ref >90)
GFR calc non Af Amer: >90 mL/min (ref >90)
Glucose, Bld: 79 mg/dL (ref 70–99)
Potassium: 3.5 mEq/L — ABNORMAL LOW (ref 3.7–5.3)
Sodium: 139 mEq/L (ref 137–147)
Total Bilirubin: <0.2 mg/dL — ABNORMAL LOW (ref 0.3–1.2)
Total Protein: 7.2 g/dL (ref 6.0–8.3)

## 2014-03-27 LAB — URINALYSIS, ROUTINE W REFLEX MICROSCOPIC
Bilirubin Urine: NEGATIVE
Glucose, UA: NEGATIVE mg/dL
Ketones, ur: NEGATIVE mg/dL
Leukocytes, UA: NEGATIVE
Nitrite: NEGATIVE
Protein, ur: NEGATIVE mg/dL
Specific Gravity, Urine: 1.035 — ABNORMAL HIGH (ref 1.005–1.030)
Urobilinogen, UA: 1 mg/dL (ref 0.0–1.0)
pH: 5.5 (ref 5.0–8.0)

## 2014-03-27 LAB — URINE MICROSCOPIC-ADD ON

## 2014-03-27 LAB — CBC WITH DIFFERENTIAL/PLATELET
Basophils Absolute: 0 10*3/uL (ref 0.0–0.1)
Basophils Relative: 0 % (ref 0–1)
Eosinophils Absolute: 0.2 10*3/uL (ref 0.0–0.7)
Eosinophils Relative: 2 % (ref 0–5)
HCT: 37.4 % (ref 36.0–46.0)
Hemoglobin: 13.1 g/dL (ref 12.0–15.0)
Lymphocytes Relative: 41 % (ref 12–46)
Lymphs Abs: 3 10*3/uL (ref 0.7–4.0)
MCH: 31 pg (ref 26.0–34.0)
MCHC: 35 g/dL (ref 30.0–36.0)
MCV: 88.6 fL (ref 78.0–100.0)
Monocytes Absolute: 0.5 10*3/uL (ref 0.1–1.0)
Monocytes Relative: 7 % (ref 3–12)
Neutro Abs: 3.6 10*3/uL (ref 1.7–7.7)
Neutrophils Relative %: 50 % (ref 43–77)
Platelets: 194 10*3/uL (ref 150–400)
RBC: 4.22 MIL/uL (ref 3.87–5.11)
RDW: 12.1 % (ref 11.5–15.5)
WBC: 7.3 10*3/uL (ref 4.0–10.5)

## 2014-03-27 LAB — LIPASE, BLOOD: Lipase: 38 U/L (ref 11–59)

## 2014-03-27 LAB — POC URINE PREG, ED: Preg Test, Ur: NEGATIVE

## 2014-03-27 MED ORDER — ONDANSETRON HCL 4 MG/2ML IJ SOLN
4.0000 mg | Freq: Once | INTRAMUSCULAR | Status: AC
  Administered 2014-03-27: 4 mg via INTRAVENOUS
  Filled 2014-03-27: qty 2

## 2014-03-27 MED ORDER — SODIUM CHLORIDE 0.9 % IV BOLUS (SEPSIS)
1000.0000 mL | INTRAVENOUS | Status: AC
  Administered 2014-03-27: 1000 mL via INTRAVENOUS

## 2014-03-27 MED ORDER — PANTOPRAZOLE SODIUM 40 MG IV SOLR
40.0000 mg | Freq: Once | INTRAVENOUS | Status: AC
  Administered 2014-03-27: 40 mg via INTRAVENOUS
  Filled 2014-03-27: qty 40

## 2014-03-27 NOTE — ED Notes (Signed)
Pt arrived to the ED with a complaint of nausea and emesis for a month.  Pt states that 'everything" causes her to have nausea.  Pt statess she has had three episodes of emesis in the last 24 hours.

## 2014-03-27 NOTE — ED Notes (Signed)
Urine collected.

## 2014-03-27 NOTE — ED Provider Notes (Signed)
CSN: [id number]     Arrival date & time 03/27/14  2055 History   First MD Initiated Contact with Patient 03/27/14 2147     Chief Complaint  Patient presents with  . Nausea  . Emesis     (Consider location/radiation/quality/duration/timing/severity/associated sxs/prior Treatment) Patient is a 23 y.o. female presenting with vomiting. The history is provided by the patient and medical records. No language interpreter was used.  Emesis Associated symptoms: no abdominal pain, no diarrhea and no headaches     Joanette GulaMireily Trower is a 23 y.o. female  with a hx of seizures, TBI, asthma presents to the Emergency Department complaining of gradual, persistent, progressively worsening nausea and vomiting onset 1 month ago.  Pt reports she has nausea if she smells something and after she eats. She reports emesis after every meal approx 20min afterwards, but denies abd pain.  Emesis is nonbloody, nonbilious. She denies associated symptoms.  Pt denies OTC treatments or being evaluated for this in the past.  No alleviating factors.  Pt denies fever, chills, headache, neck pain, chest pain, SOB, abd pain, diarrhea, weakness, dizziness, syncope.  LMP: 2 mos ago and pt reports she is normally irregular.  She reports she is sexually active and has the nexplanon.  Pt denies smoking, EtOH use, street drugs, ibuprofen/NSAID use.   Past Medical History  Diagnosis Date  . Seizures   . Closed head injury   . Scoliosis   . Asthma    Past Surgical History  Procedure Laterality Date  . Back surgery      screws placed for scoliosis   Family History  Problem Relation Age of Onset  . Anesthesia problems Neg Hx   . Hypertension Father   . Diabetes Maternal Grandmother   . Hypertension Maternal Grandmother   . Diabetes Paternal Grandmother   . Hypertension Paternal Grandmother    History  Substance Use Topics  . Smoking status: Never Smoker   . Smokeless tobacco: Never Used  . Alcohol Use: No   OB History    Grav Para Term Preterm Abortions TAB SAB Ect Mult Living   2 2 1 1      2      Review of Systems  Constitutional: Negative for fever, diaphoresis, appetite change, fatigue and unexpected weight change.  HENT: Negative for mouth sores.   Eyes: Negative for visual disturbance.  Respiratory: Negative for cough, chest tightness, shortness of breath and wheezing.   Cardiovascular: Negative for chest pain.  Gastrointestinal: Positive for nausea and vomiting. Negative for abdominal pain, diarrhea and constipation.  Endocrine: Negative for polydipsia, polyphagia and polyuria.  Genitourinary: Negative for dysuria, urgency, frequency and hematuria.  Musculoskeletal: Negative for back pain and neck stiffness.  Skin: Negative for rash.  Allergic/Immunologic: Negative for immunocompromised state.  Neurological: Negative for syncope, light-headedness and headaches.  Hematological: Does not bruise/bleed easily.  Psychiatric/Behavioral: Negative for sleep disturbance. The patient is not nervous/anxious.       Allergies  Review of patient's allergies indicates no known allergies.  Home Medications   Prior to Admission medications   Medication Sig Start Date End Date Taking? Authorizing Provider  etonogestrel (NEXPLANON) 68 MG IMPL implant Inject 1 each into the skin once.    Historical Provider, MD  omeprazole (PRILOSEC) 20 MG capsule Take 1 capsule (20 mg total) by mouth daily. 03/28/14   Emmajane Altamura, PA-C  ondansetron (ZOFRAN ODT) 4 MG disintegrating tablet 4mg  ODT q4 hours prn nausea/vomit 03/28/14   Haila Dena, PA-C   BP  95/60  Pulse 85  Temp(Src) 98.4 F (36.9 C) (Oral)  Resp 18  SpO2 100% Physical Exam  Nursing note and vitals reviewed. Constitutional: She is oriented to person, place, and time. She appears well-developed and well-nourished. No distress.  Awake, alert, nontoxic appearance  HENT:  Head: Normocephalic and atraumatic.  Mouth/Throat: Oropharynx is clear and  moist. No oropharyngeal exudate.  Eyes: Conjunctivae are normal. No scleral icterus.  Neck: Normal range of motion. Neck supple.  Cardiovascular: Normal rate, regular rhythm, normal heart sounds and intact distal pulses.   No murmur heard. Pulmonary/Chest: Effort normal and breath sounds normal. No respiratory distress. She has no wheezes.  Abdominal: Soft. Bowel sounds are normal. She exhibits no distension and no mass. There is no tenderness. There is no rebound and no guarding.  Abdomen soft nontender Negative Murphy's sign  Musculoskeletal: Normal range of motion. She exhibits no edema.  Neurological: She is alert and oriented to person, place, and time. No cranial nerve deficit.  Speech is clear and goal oriented Moves extremities without ataxia  Skin: Skin is warm and dry. She is not diaphoretic. No erythema.  Psychiatric: She has a normal mood and affect.    ED Course  Procedures (including critical care time) Labs Review Labs Reviewed  COMPREHENSIVE METABOLIC PANEL - Abnormal; Notable for the following:    Potassium 3.5 (*)    Total Bilirubin <0.2 (*)    All other components within normal limits  URINALYSIS, ROUTINE W REFLEX MICROSCOPIC - Abnormal; Notable for the following:    Specific Gravity, Urine 1.035 (*)    Hgb urine dipstick SMALL (*)    All other components within normal limits  CBC WITH DIFFERENTIAL  LIPASE, BLOOD  URINE MICROSCOPIC-ADD ON  POC URINE PREG, ED    Imaging Review No results found.   EKG Interpretation None      MDM   Final diagnoses:  Non-intractable vomiting with nausea, vomiting of unspecified type   [name] presents with one month of nausea and intermittent vomiting. Patient pregnancy test negative; labs reassuring.  No evidence of UTI but likely mild dehydration. We'll give fluid boluses, Zofran and Protonix. Patient complaining of intermittent burning in her esophagus likely secondary to GERD. She is a taking any medication  for this. Her abdomen is soft and nontender and there is no evidence of appendicitis, diverticulitis, cholecystitis. Patient is afebrile with stable vital signs.  11:15 PM Patient remained stable and is feeling better. Tolerating by mouth fluids without difficulty. Will refer to GI for further evaluation and give omeprazole for home use.  Patient is nontoxic, nonseptic appearing, in no apparent distress.  Patient's symptoms adequately managed in emergency department.  Fluid bolus given.  Labs and vitals reviewed.  Patient does not meet the SIRS or Sepsis criteria.  On repeat exam pt abd remains soft and nontender.  No indication of appendicitis, bowel obstruction, bowel perforation, cholecystitis, diverticulitis, PID or ectopic pregnancy.  Patient discharged home with symptomatic treatment and given strict instructions for follow-up with their GI.   I have personally reviewed patient's vitals, nursing note and any pertinent labs or imaging.  I performed an undressed physical exam.    At this time, it has been determined that no acute conditions requiring further emergency intervention. The patient/guardian have been advised of the diagnosis and plan. I reviewed all labs and imaging including any potential incidental findings. We have discussed signs and symptoms that warrant return to the ED, such as intractable vomiting, high fevers  or abdominal pain.  Patient/guardian has voiced understanding and agreed to follow-up with the PCP or specialist in 3 days.  Vital signs are stable at discharge.   BP 95/60  Pulse 85  Temp(Src) 98.4 F (36.9 C) (Oral)  Resp 18  SpO2 100%           Dierdre ForthHannah Quinlan Vollmer, PA-C 03/28/14 [phone]

## 2014-03-28 MED ORDER — ONDANSETRON 4 MG PO TBDP: ORAL_TABLET | ORAL | Status: DC

## 2014-03-28 MED ORDER — OMEPRAZOLE 20 MG PO CPDR: 20.0000 mg | DELAYED_RELEASE_CAPSULE | Freq: Every day | ORAL | Status: DC

## 2014-03-28 NOTE — ED Notes (Signed)
Pt able to tolerate PO fluids without emesis.  

## 2014-03-28 NOTE — ED Notes (Signed)
Pt given a Sprite. 

## 2014-03-28 NOTE — Discharge Instructions (Signed)
1. Medications: omeprazole, zofran, usual home medications 2. Treatment: rest, drink plenty of fluids, advance diet slowly 3. Follow Up: Please followup with gastroenterology for further evaluation and discussion of your diagnosis tonight.  Nausea and Vomiting Nausea is a sick feeling that often comes before throwing up (vomiting). Vomiting is a reflex where stomach contents come out of your mouth. Vomiting can cause severe loss of body fluids (dehydration). Children and elderly adults can become dehydrated quickly, especially if they also have diarrhea. Nausea and vomiting are symptoms of a condition or disease. It is important to find the cause of your symptoms. CAUSES   Direct irritation of the stomach lining. This irritation can result from increased acid production (gastroesophageal reflux disease), infection, food poisoning, taking certain medicines (such as nonsteroidal anti-inflammatory drugs), alcohol use, or tobacco use.  Signals from the brain.These signals could be caused by a headache, heat exposure, an inner ear disturbance, increased pressure in the brain from injury, infection, a tumor, or a concussion, pain, emotional stimulus, or metabolic problems.  An obstruction in the gastrointestinal tract (bowel obstruction).  Illnesses such as diabetes, hepatitis, gallbladder problems, appendicitis, kidney problems, cancer, sepsis, atypical symptoms of a heart attack, or eating disorders.  Medical treatments such as chemotherapy and radiation.  Receiving medicine that makes you sleep (general anesthetic) during surgery. DIAGNOSIS Your caregiver may ask for tests to be done if the problems do not improve after a few days. Tests may also be done if symptoms are severe or if the reason for the nausea and vomiting is not clear. Tests may include:  Urine tests.  Blood tests.  Stool tests.  Cultures (to look for evidence of infection).  X-rays or other imaging studies. Test results  can help your caregiver make decisions about treatment or the need for additional tests. TREATMENT You need to stay well hydrated. Drink frequently but in small amounts.You may wish to drink water, sports drinks, clear broth, or eat frozen ice pops or gelatin dessert to help stay hydrated.When you eat, eating slowly may help prevent nausea.There are also some antinausea medicines that may help prevent nausea. HOME CARE INSTRUCTIONS   Take all medicine as directed by your caregiver.  If you do not have an appetite, do not force yourself to eat. However, you must continue to drink fluids.  If you have an appetite, eat a normal diet unless your caregiver tells you differently.  Eat a variety of complex carbohydrates (rice, wheat, potatoes, bread), lean meats, yogurt, fruits, and vegetables.  Avoid high-fat foods because they are more difficult to digest.  Drink enough water and fluids to keep your urine clear or pale yellow.  If you are dehydrated, ask your caregiver for specific rehydration instructions. Signs of dehydration may include:  Severe thirst.  Dry lips and mouth.  Dizziness.  Dark urine.  Decreasing urine frequency and amount.  Confusion.  Rapid breathing or pulse. SEEK IMMEDIATE MEDICAL CARE IF:   You have blood or brown flecks (like coffee grounds) in your vomit.  You have black or bloody stools.  You have a severe headache or stiff neck.  You are confused.  You have severe abdominal pain.  You have chest pain or trouble breathing.  You do not urinate at least once every 8 hours.  You develop cold or clammy skin.  You continue to vomit for longer than 24 to 48 hours.  You have a fever. MAKE SURE YOU:   Understand these instructions.  Will watch your condition.  Will get help right away if you are not doing well or get worse. Document Released: 09/09/2005 Document Revised: 12/02/2011 Document Reviewed: 02/06/2011 [facility] Patient Information  2015 [place]. This information is not intended to replace advice given to you by your health care provider. Make sure you discuss any questions you have with your health care provider.

## 2014-03-30 NOTE — ED Provider Notes (Signed)
Medical screening examination/treatment/procedure(s) were performed by non-physician practitioner and as supervising physician I was immediately available for consultation/collaboration.   EKG Interpretation None       Ethelda ChickMartha K Linker, MD 03/30/14 [phone]

## 2014-04-14 ENCOUNTER — Encounter (HOSPITAL_COMMUNITY): Payer: Self-pay | Admitting: Emergency Medicine

## 2014-04-14 ENCOUNTER — Emergency Department (HOSPITAL_COMMUNITY)
Admission: EM | Admit: 2014-04-14 | Discharge: 2014-04-14 | Disposition: A | Discharge status: Home / Self Care | Payer: Self-pay | Attending: Emergency Medicine | Admitting: Emergency Medicine

## 2014-04-14 ENCOUNTER — Emergency Department (HOSPITAL_COMMUNITY): Payer: Medicaid Other

## 2014-04-14 DIAGNOSIS — M419 Scoliosis, unspecified: Secondary | ICD-10-CM

## 2014-04-14 DIAGNOSIS — W010XXA Fall on same level from slipping, tripping and stumbling without subsequent striking against object, initial encounter: Secondary | ICD-10-CM | POA: Insufficient documentation

## 2014-04-14 DIAGNOSIS — Z79899 Other long term (current) drug therapy: Secondary | ICD-10-CM | POA: Insufficient documentation

## 2014-04-14 DIAGNOSIS — Y9389 Activity, other specified: Secondary | ICD-10-CM | POA: Insufficient documentation

## 2014-04-14 DIAGNOSIS — Z87820 Personal history of traumatic brain injury: Secondary | ICD-10-CM | POA: Insufficient documentation

## 2014-04-14 DIAGNOSIS — M549 Dorsalgia, unspecified: Secondary | ICD-10-CM | POA: Insufficient documentation

## 2014-04-14 DIAGNOSIS — M4120 Other idiopathic scoliosis, site unspecified: Secondary | ICD-10-CM | POA: Insufficient documentation

## 2014-04-14 DIAGNOSIS — Z791 Long term (current) use of non-steroidal anti-inflammatories (NSAID): Secondary | ICD-10-CM | POA: Insufficient documentation

## 2014-04-14 DIAGNOSIS — J45901 Unspecified asthma with (acute) exacerbation: Secondary | ICD-10-CM | POA: Insufficient documentation

## 2014-04-14 DIAGNOSIS — S20219A Contusion of unspecified front wall of thorax, initial encounter: Secondary | ICD-10-CM | POA: Insufficient documentation

## 2014-04-14 DIAGNOSIS — Y9289 Other specified places as the place of occurrence of the external cause: Secondary | ICD-10-CM | POA: Insufficient documentation

## 2014-04-14 DIAGNOSIS — S20212A Contusion of left front wall of thorax, initial encounter: Secondary | ICD-10-CM

## 2014-04-14 DIAGNOSIS — Z9889 Other specified postprocedural states: Secondary | ICD-10-CM | POA: Insufficient documentation

## 2014-04-14 DIAGNOSIS — Z8669 Personal history of other diseases of the nervous system and sense organs: Secondary | ICD-10-CM | POA: Insufficient documentation

## 2014-04-14 LAB — D-DIMER, QUANTITATIVE (NOT AT ARMC): D-Dimer, Quant: <0.27 ug/mL-FEU (ref 0.00–0.48)

## 2014-04-14 MED ORDER — OXYCODONE-ACETAMINOPHEN 5-325 MG PO TABS
1.0000 | ORAL_TABLET | Freq: Once | ORAL | Status: AC
  Administered 2014-04-14: 1 via ORAL
  Filled 2014-04-14: qty 1

## 2014-04-14 MED ORDER — NAPROXEN 500 MG PO TABS: 500.0000 mg | ORAL_TABLET | Freq: Two times a day (BID) | ORAL | Status: DC | PRN

## 2014-04-14 MED ORDER — OXYCODONE-ACETAMINOPHEN 5-325 MG PO TABS: 1.0000 | ORAL_TABLET | Freq: Four times a day (QID) | ORAL | Status: DC | PRN

## 2014-04-14 MED ORDER — KETOROLAC TROMETHAMINE 30 MG/ML IJ SOLN
60.0000 mg | Freq: Once | INTRAMUSCULAR | Status: AC
  Administered 2014-04-14: 60 mg via INTRAMUSCULAR
  Filled 2014-04-14: qty 2

## 2014-04-14 NOTE — ED Provider Notes (Signed)
CSN: [id number]     Arrival date & time 04/14/14  1425 History   First MD Initiated Contact with Patient 04/14/14 1514     Chief Complaint  Patient presents with  . Back Pain     (Consider location/radiation/quality/duration/timing/severity/associated sxs/prior Treatment) HPI Comments: Erin Price is a 23 y.o. Female with a PMHx of seizures, TBI, asthma, and scoliosis s/p screws/hardware placement 10 yrs ago, presenting today with mid-back pain. States she tripped down a step and fell on concrete one week ago, took ibuprofen which helped. Then she noticed last night that the pain increased, worse with deep inspiration and bending forward, better with lying down, has not tried any medications or heat packs. States it's moderate in severity, aching, non-radiating, and constant. Endorses feeling SOB, but states that it's due to the pain she has when she takes a deep breath in. Denies fevers, chills, CP, diaphoresis, LE swelling, hx of DVT/PE, recent travel or surgery, paresthesias, numbness, weakness, incontinence, abd pain, n/v/d/c, myalgias or arthralgias. She's concerned about the hardware she had in her back, and whether something happened to it. States she has nexplanon placed.  Patient is a 23 y.o. female presenting with back pain. The history is provided by the patient. No language interpreter was used.  Back Pain Location:  Thoracic spine Quality:  Aching Radiates to:  Does not radiate Pain severity:  Moderate Pain is:  Same all the time Onset quality:  Sudden Duration:  1 day Timing:  Constant Progression:  Unchanged Chronicity:  New Context: recent injury   Relieved by:  Lying down Worsened by:  Bending and deep breathing Ineffective treatments:  None tried Associated symptoms: no abdominal pain, no bladder incontinence, no bowel incontinence, no chest pain, no dysuria, no fever, no headaches, no leg pain, no numbness, no paresthesias, no perianal numbness, no tingling and no  weakness   Risk factors comment:  Hx of scoliosis s/p spinal surgery   Past Medical History  Diagnosis Date  . Seizures   . Closed head injury   . Scoliosis   . Asthma    Past Surgical History  Procedure Laterality Date  . Back surgery      screws placed for scoliosis   Family History  Problem Relation Age of Onset  . Anesthesia problems Neg Hx   . Hypertension Father   . Diabetes Maternal Grandmother   . Hypertension Maternal Grandmother   . Diabetes Paternal Grandmother   . Hypertension Paternal Grandmother    History  Substance Use Topics  . Smoking status: Never Smoker   . Smokeless tobacco: Never Used  . Alcohol Use: No   OB History   Grav Para Term Preterm Abortions TAB SAB Ect Mult Living   2 2 1 1      2      Review of Systems  Constitutional: Negative for fever and chills.  HENT: Negative for congestion.   Respiratory: Positive for shortness of breath ("hurts to take a deep breath"). Negative for cough, chest tightness and wheezing.   Cardiovascular: Negative for chest pain, palpitations and leg swelling.  Gastrointestinal: Negative for nausea, vomiting, abdominal pain, diarrhea, constipation, blood in stool and bowel incontinence.  Genitourinary: Negative for bladder incontinence and dysuria.  Musculoskeletal: Positive for back pain. Negative for joint swelling, myalgias, neck pain and neck stiffness.  Skin: Negative for color change.  Neurological: Negative for dizziness, tingling, syncope, weakness, numbness, headaches and paresthesias.  Psychiatric/Behavioral: Negative for confusion.  10 Systems reviewed and are negative for  acute change except as noted in the HPI.     Allergies  Review of patient's allergies indicates no known allergies.  Home Medications   Prior to Admission medications   Medication Sig Start Date End Date Taking? Authorizing Provider  etonogestrel (NEXPLANON) 68 MG IMPL implant Inject 1 each into the skin once.   Yes  Historical Provider, MD  ibuprofen (ADVIL,MOTRIN) 200 MG tablet Take 200 mg by mouth every 8 (eight) hours as needed for moderate pain.    Yes Historical Provider, MD  naproxen (NAPROSYN) 500 MG tablet Take 1 tablet (500 mg total) by mouth 2 (two) times daily as needed for mild pain, moderate pain or headache (TAKE WITH MEALS.). 04/14/14   Annete Ayuso Strupp Camprubi-Soms, PA-C  oxyCODONE-acetaminophen (PERCOCET) 5-325 MG per tablet Take 1-2 tablets by mouth every 6 (six) hours as needed for severe pain. 04/14/14   Chetan Mehring Strupp Camprubi-Soms, PA-C   BP 105/66  Pulse 74  Temp(Src) 98.8 F (37.1 C) (Oral)  Resp 18  SpO2 99%  LMP 04/12/2014 Physical Exam  Nursing note and vitals reviewed. Constitutional: She is oriented to person, place, and time. Vital signs are normal. She appears well-developed and well-nourished. No distress.  Afebrile, NAD, resting comfortably in bed  HENT:  Head: Normocephalic and atraumatic.  Mouth/Throat: Mucous membranes are normal.  Eyes: Conjunctivae and EOM are normal. Right eye exhibits no discharge. Left eye exhibits no discharge.  Neck: Normal range of motion. Neck supple. No spinous process tenderness and no muscular tenderness present. No rigidity. Normal range of motion present.  FROM intact, no spinous process or muscular TTP, no rigidity  Cardiovascular: Normal rate, regular rhythm, normal heart sounds and intact distal pulses.  Exam reveals no gallop and no friction rub.   No murmur heard. RRR, nl s1/s2, no m/r/g, no friction rubs, distal pulses equal and intact  Pulmonary/Chest: Effort normal and breath sounds normal. No respiratory distress. She has no decreased breath sounds. She has no wheezes. She has no rhonchi. She has no rales.   She exhibits tenderness (posterior). She exhibits no crepitus, no swelling and no retraction.  CTAB in all lung fields, no w/r/r, mild splinting with deep inspiration. Posterior chest wall TTP along 4-9th ribs, in  midscapular line. No crepitus or deformity, no erythema or warmth, no subQ air  Abdominal: Soft. Normal appearance and bowel sounds are normal. She exhibits no distension. There is no tenderness. There is no rigidity, no rebound and no guarding.  Musculoskeletal: Normal range of motion.  Mild scoliosis noted. No midline bony TTP in all spinal levels, baseline ROM intact in all spinal levels. No paraspinous muscle TTP. Moving all extremities with ease. Strength 5/5 in all extremities. Sensation grossly intact in all extremities.  Neurological: She is alert and oriented to person, place, and time. She has normal strength. No sensory deficit.  Strength 5/5 in all extremities. Sensation grossly intact in all extremities.  Skin: Skin is warm, dry and intact. No rash noted.  No abrasions or erythema, no bruising  Psychiatric: She has a normal mood and affect.    ED Course  Procedures (including critical care time) Labs Review Labs Reviewed  D-DIMER, QUANTITATIVE    Imaging Review Dg Chest 2 View  04/14/2014   CLINICAL DATA:  thoracic back pain worse with inspiration, r/o rib fractures or malaligned hardware from prior scoliosis surgery  EXAM: CHEST  2 VIEW  COMPARISON:  None.  FINDINGS: The cardiac and mediastinal silhouettes are stable in size and contour,  and remain within normal limits.  The lungs are normally inflated. No airspace consolidation, pleural effusion, or pulmonary edema is identified. There is no pneumothorax.  Dextroscoliosis of the thoracolumbar spine is present. Lateral plate screw fixation seen at the L1 through L4 levels. Hardware is grossly aligned. No acute fracture or other osseous abnormality.  IMPRESSION: 1. No acute cardiopulmonary abnormality. 2. Dextroscoliosis with spinal fusion at the L1 through L4 levels. No acute osseous abnormality or evidence of hardware complication on this study.   Electronically Signed   By: Rise MuBenjamin  McClintock M.D.   On: 04/14/2014 16:59   Dg  Lumbar Spine Complete  04/14/2014   CLINICAL DATA:  Back pain from shoulder blades into the lower back; history of previous scoliosis surgery  EXAM: LUMBAR SPINE - COMPLETE 4+ VIEW  COMPARISON:  None  FINDINGS: The lumbar vertebral bodies are preserved in height. There is gentle levoscoliosis. There is a left-sided metallic sideplate extending from T12 through L3 with cortical screws at each level. No hardware failure is demonstrated. The disc space heights at L3-4, L4-5, and L5-S1 are preserved. There is narrowing from T12-L1 through L2-L3.  IMPRESSION: There is no acute bony abnormality of the lumbar spine. There is stable mild levoscoliosis and changes of previous upper lumbar fusion.   Electronically Signed   By: David  SwazilandJordan   On: 04/14/2014 17:00     EKG Interpretation   Date/Time:  Thursday April 14 2014 16:14:40 EDT Ventricular Rate:  70 PR Interval:  135 QRS Duration: 78 QT Interval:  388 QTC Calculation: 419 R Axis:   88 Text Interpretation:  Sinus rhythm Baseline wander in lead(s) V1 Confirmed  by ZACKOWSKI  MD, SCOTT ([id number]) on 04/14/2014 4:32:33 PM      MDM   Final diagnoses:  Chest wall contusion, left, initial encounter  Scoliosis    Joanette GulaMireily Bognar is a 23 y.o. female with a PMHx of seizures, TBI, and scoliosis presenting today with L posterior chest wall pain after a fall. Clear lung exam with mild splinting. VSS, NAD. Pain reproducible on exam. Given pleuritic pain, will obtain d-dimer and EKG since pt is low risk, to r/o PE/DVT and ACS. No need for troponin given no CP. Will obtain imaging given hx of hardware in back, and recent fall, and to r/o rib fx as well. I believe this pain is likely musculoskeletal pain related to a contusion. Will give percocet PO, given that pressures are soft at this moment which is patient's baseline.  4:35 PM EKG WNL. Imaging and d-dimer pending  5:06 PM Pain slightly improved with PO percocet. Labs and imaging all negative, which is  reassuring. Will give toradol 60mg  IM and send pt out with naprosyn and percocet. Instructed pt on using heat to the area. I explained the diagnosis and have given explicit precautions to return to the ER including for any other new or worsening symptoms. The patient understands and accepts the medical plan as it's been dictated and I have answered their questions. Discharge instructions concerning home care and prescriptions have been given. The patient is STABLE and is discharged to home in good condition.  BP 105/66  Pulse 74  Temp(Src) 98.8 F (37.1 C) (Oral)  Resp 18  SpO2 99%  LMP 04/12/2014   Donnita FallsMercedes Strupp Camprubi-Soms, PA-C 04/14/14 1755

## 2014-04-14 NOTE — ED Provider Notes (Signed)
Medical screening examination/treatment/procedure(s) were performed by non-physician practitioner and as supervising physician I was immediately available for consultation/collaboration.   EKG Interpretation   Date/Time:  Thursday April 14 2014 16:14:40 EDT Ventricular Rate:  70 PR Interval:  135 QRS Duration: 78 QT Interval:  388 QTC Calculation: 419 R Axis:   88 Text Interpretation:  Sinus rhythm Baseline wander in lead(s) V1 Confirmed  by Deretha EmoryZACKOWSKI  MD, Giordano Getman ([id number]) on 04/14/2014 4:32:33 PM        Vanetta MuldersScott Marce Charlesworth, MD 04/14/14 1850

## 2014-04-14 NOTE — Discharge Instructions (Signed)
Your x-rays and lab work were reassuring today. Your back and ribs do not appear to have any fractures. Your back does not have any concerning findings related to your hardware. I believe this pain is related to a contusion, which is a bruise of the underlying tissues in your back/chest wall. Stay well hydrated. Use a heat pack for 20 minutes every hour to help relieve the pain. Use Percocet and Naprosyn as directed, as needed for pain. Use resource guide below to find a primary care doctor for followup in 1 week if symptoms don't resolve. If any changes or worsening or symptoms occur, please return to the emergency department.   Chest Contusion A chest contusion is a deep bruise on your chest area. Contusions are the result of an injury that caused bleeding under the skin. A chest contusion may involve bruising of the skin, muscles, or ribs. The contusion may turn blue, purple, or yellow. Minor injuries will give you a painless contusion, but more severe contusions may stay painful and swollen for a few weeks. CAUSES  A contusion is usually caused by a blow, trauma, or direct force to an area of the body. SYMPTOMS   Swelling and redness of the injured area.  Discoloration of the injured area.  Tenderness and soreness of the injured area.  Pain. DIAGNOSIS  The diagnosis can be made by taking a history and performing a physical exam. An X-ray, CT scan, or MRI may be needed to determine if there were any associated injuries, such as broken bones (fractures) or internal injuries. TREATMENT  Often, the best treatment for a chest contusion is resting, icing, and applying cold compresses to the injured area. Deep breathing exercises may be recommended to reduce the risk of pneumonia. Over-the-counter medicines may also be recommended for pain control. HOME CARE INSTRUCTIONS   Put ice on the injured area.  Put ice in a plastic bag.  Place a towel between your skin and the bag.  Leave the ice on  for 15-20 minutes, 03-04 times a day.  Only take over-the-counter or prescription medicines as directed by your caregiver. Your caregiver may recommend avoiding anti-inflammatory medicines (aspirin, ibuprofen, and naproxen) for 48 hours because these medicines may increase bruising.  Rest the injured area.  Perform deep-breathing exercises as directed by your caregiver.  Stop smoking if you smoke.  Do not lift objects over 5 pounds (2.3 kg) for 3 days or longer if recommended by your caregiver. SEEK IMMEDIATE MEDICAL CARE IF:   You have increased bruising or swelling.  You have pain that is getting worse.  You have difficulty breathing.  You have dizziness, weakness, or fainting.  You have blood in your urine or stool.  You cough up or vomit blood.  Your swelling or pain is not relieved with medicines. MAKE SURE YOU:   Understand these instructions.  Will watch your condition.  Will get help right away if you are not doing well or get worse. Document Released: 06/04/2001 Document Revised: 06/03/2012 Document Reviewed: 03/02/2012 [facility] Patient Information 2015 [place], [place]. This information is not intended to replace advice given to you by your health care provider. Make sure you discuss any questions you have with your health care provider.  Emergency Department Resource Guide 1) Find a Doctor and Pay Out of Pocket Although you won't have to find out who is covered by your insurance plan, it is a good idea to ask around and get recommendations. You will then need to call the  office and see if the doctor you have chosen will accept you as a new patient and what types of options they offer for patients who are self-pay. Some doctors offer discounts or will set up payment plans for their patients who do not have insurance, but you will need to ask so you aren't surprised when you get to your appointment.  2) Contact Your Local Health Department Not all health departments  have doctors that can see patients for sick visits, but many do, so it is worth a call to see if yours does. If you don't know where your local health department is, you can check in your phone book. The CDC also has a tool to help you locate your state's health department, and many state websites also have listings of all of their local health departments.  3) Find a Walk-in Clinic If your illness is not likely to be very severe or complicated, you may want to try a walk in clinic. These are popping up all over the country in pharmacies, drugstores, and shopping centers. They're usually staffed by nurse practitioners or physician assistants that have been trained to treat common illnesses and complaints. They're usually fairly quick and inexpensive. However, if you have serious medical issues or chronic medical problems, these are probably not your best option.  No Primary Care Doctor: - Call Health Connect at  [phone] - they can help you locate a primary care doctor that  accepts your insurance, provides certain services, etc. - Physician Referral Service- [phone]  Chronic Pain Problems: Organization         Address  Phone   Notes  [facility]  [phone] Patients need to be referred by their primary care doctor.   Medication Assistance: Organization         Address  Phone   Notes  [facility] [employer] [facility] [address] [place], [place] [address] [phone] --Must be a resident of [facility] -- Must have NO insurance coverage whatsoever (no Medicaid/ Medicare, etc.) -- The pt. MUST have a primary care doctor that directs their care regularly and follows them in the community   Med[facility]ist  [phone]   [employer]  [phone]    Agencies that provide inexpensive medical care: Organization         Address  Phone   Notes  [facility]  [phone]   [facility]    [phone]    [facility] [address] [place], [place] [address] [phone]   [facility] [address], [place] [phone]   Planned Parenthood    [phone]   [facility]    [phone]   [facility]  [address], [place] Phone:  [phone], Fax:  [phone] Hours of Operation:  9 am - 6 pm, M-F.  Also accepts Medicaid/Medicare and self-pay.  [facility]  [address], [place] Phone: [phone], Fax: [phone]. Hours of Operation:  8:30 am - 5:30 pm, M-F.  Also accepts Medicaid and self-pay.  [facility] [place] [address], [place] Phone: [phone]   [facility] [address], [place], [place] [phone], Ext. [phone] Mondays & Thursdays: 7-9 AM.  First 15 patients are seen on a first come, first serve basis.    Medicaid-accepting  [facility] Providers:  Organization         Address  Phone   Notes  [facility] [address], [place] [phone] Also accepts self-pay patients.  [facility] [address] Laurell Josephs [place], [place]  [phone]   [facility] [address], [place] [phone]   [facility] [address], [place] [phone]   Renaye Rakers [address], [place]   [phone] Only accepts [place] Access [place] patients after they have their name applied to their card.   Self-Pay (no insurance) in [facility]:  Organization         Address  Phone   Notes  Sickle Cell Patients, [facility] [address] [place], [place] [phone]   [facility] [address] [place], [place] [phone]   [facility] [place]  [address], [place] [phone]   [facility]/Dr. Osei-Bonsu  [address], [place] or [address], [place] [phone] Phone number for both [place] and [place] locations is the same.  [facility] [address], [place] [phone]   [facility] [address], [place] or [address] [phone]   [facility] [address], [place] [phone], phone; [phone], fax Sees patients 1st and 3rd Saturday of every month.  Must not qualify for public or private insurance (i.e. Medicaid, Medicare, [place] Health Choice, Veterans' Benefits)  Household income should be no more than 200% of the poverty level The clinic cannot treat you if you are pregnant or think you are pregnant  Sexually transmitted diseases are not treated at the clinic.    Dental Care: Organization         Address  Phone  Notes  [facility] Department of [facility] [address] [place], [place] [phone] Accepts children up to age 45 who are enrolled in [place] or [place] Health Choice; pregnant women with a Medicaid card; and children who have applied for Medicaid or [place] Health Choice, but were declined, whose parents can pay a reduced fee at time of service.  [facility] Department of [facility]  [address], [place] [phone] Accepts children up to age 68 who are enrolled in [place] or [place] Health Choice; pregnant women with a Medicaid card; and children who have applied for Medicaid or [place] Health Choice, but were declined, whose parents can pay a reduced fee at time of service.  [facility]  [address] [place], [place] [phone] Patients are seen by appointment only. Walk-ins are not accepted. [facility] will see patients 70 years of age and older. Monday - Tuesday (8am-5pm) Most Wednesdays (8:30-5pm) $30 per visit, cash only  [facility]  Adult Dental Access PROGRAM  [address], [facility] [phone] Patients are seen by appointment only. Walk-ins are not accepted. [facility] will see patients 45 years of age and older. One Wednesday Evening (Monthly: Volunteer Based).  $30 per visit, cash only  [employer]  [phone] for adults; Children under age 67, call Graduate Pediatric Dentistry at [phone]. Children aged 66-14, please call [phone] to request a pediatric  application.  Dental services are provided in all areas of dental care including fillings, crowns and bridges, complete and partial dentures, implants, gum treatment, root canals, and extractions. Preventive care is also provided. Treatment is provided to both adults and children. Patients are selected via a lottery and there is often a waiting list.   [facility] [address] [place]  [phone] [email or web address]   [facility] [address] [place], [place] [phone], Ext. [phone] Second and Fourth Thursday of each month, opens at 6:30 AM; Clinic ends at 9 AM.  Patients are seen on a first-come first-served basis, and a limited number are seen during each clinic.   [facility]  [address] Griffins [place], [place] [phone]   Eligibility Requirements You must have lived in [place], [place], or [place] counties for at least the last three months.   You cannot be eligible for state or federal sponsored [place], including [employer], [place], or [employer].   You generally cannot be eligible for healthcare insurance through your employer.    How to apply: Eligibility screenings are held every Tuesday and Wednesday afternoon from 1:00 pm until 4:00 pm. You do not need an appointment for the interview!  [facility] [address] [place], [place] [phone]   [facility] Health Department  [phone]   [facility] Health Department  [phone]   [facility] Health Department  [phone]    Behavioral Health Resources in the Community: Intensive Outpatient Programs Organization         Address  Phone  Notes  [facility] [address] [place], [place] [phone]   [facility] Outpatient [address] [place], [place] [phone]   [facility]: [facility] Drug S[facility] [address] [place], [place]  [phone]   [facility] Mental Health [address],  [place], [place] [phone] or [phone]   Substance Abuse Resources Organization         Address  Phone  Notes  Alcohol and Drug Services  [phone]   [facility]  [phone]   [facility] [place]  [phone]   Floydene Flock  [phone]   Residential & Outpatient Substance Abuse Program  [phone]   Psychological Services Organization         Address  Phone  Notes  [facility] Behavioral Health  [phone]   [facility] Services  [phone]   [facility] Mental Health [address] [place] [phone] or [phone]    Mobile Crisis Teams Organization         Address  Phone  Notes  [facility], Mobile Crisis Care Unit  [phone]   [facility]  [address] [place], [place] [phone]   Doristine Locks [address] [place] [phone]    Self-Help/Support Groups Organization         Address  Phone             Notes  [facility] [place] - variety of support groups  [phone]- I7437963 Call for more information  Narcotics Anonymous (NA), [facility] [address], [employer] [place]  2 meetings at this location   Statistician         Address  Phone  Notes  [facility] [address],    [place]  [phone]   [facility]  [address], [place] [phone], [place],  [facility] [phone]   [facility] Residential Treatment  Facility [address] [place], [place] [phone] Admissions: 8am-3pm M-F  [facility] [address]    [place], [place] [phone]   [facility] [address] [place], [place], [place] [phone]   [facility] [address]  [place], [place] [phone]   [facility] - Intensive Outpatient [address]ure[address], [place], [place] [phone]   [employer] (Addiction Recovery [facility]oc[facility] [address] [place].,  [place], [place] [phone] or [phone]   [facility] ([facility]) [address], [place], [place] [phone] Accepts Medicaid  [facility] [address],  [place] [phone] Substance Abuse/Addiction Treatment   [facility] Organization         Address  Phone  Notes  [facility]  [phone]   Angie Fava, PhD [address] Ervin Knack [place], [place]   [phone] or [phone]   [facility] Behavioral   [address] [place], [place] [phone]   [facility] [address], [place], [place] [phone] Insurance/Medicaid/sponsorship through [facility] and Families [address]                                    [place], [place] [phone] Therapy/tele-psych/case  [facility] [address], [place] [phone]    Dr. Lolly Mustache  [phone]   [facility]  [employer] [facility] Dept. 1) [address], [place] 2) [address] [place] 3)  [address], [place] [phone]  [phone]   [facility] Child Abuse Hotline 475-641-3995 or [phone] (After Hours)

## 2014-04-14 NOTE — ED Notes (Signed)
Pt presents with L mid-back pain after tripped over a step chasing her son, landing on L side onto concrete step x 1 week ago.  Pt reports she was able to handle pain with ibuprofen until last night, reports pain suddenly worsening.  +short of breath, pain increases with deep inspiration; pt has h/o scoliosis with surgery x 11 years ago.

## 2014-04-28 ENCOUNTER — Emergency Department (HOSPITAL_COMMUNITY)
Admission: EM | Admit: 2014-04-28 | Discharge: 2014-04-28 | Disposition: A | Discharge status: Home / Self Care | Payer: Medicaid Other | Attending: Emergency Medicine | Admitting: Emergency Medicine

## 2014-04-28 ENCOUNTER — Encounter (HOSPITAL_COMMUNITY): Payer: Self-pay | Admitting: Emergency Medicine

## 2014-04-28 ENCOUNTER — Emergency Department (HOSPITAL_COMMUNITY): Payer: Medicaid Other

## 2014-04-28 DIAGNOSIS — M549 Dorsalgia, unspecified: Secondary | ICD-10-CM | POA: Insufficient documentation

## 2014-04-28 DIAGNOSIS — IMO0001 Reserved for inherently not codable concepts without codable children: Secondary | ICD-10-CM | POA: Insufficient documentation

## 2014-04-28 DIAGNOSIS — G8911 Acute pain due to trauma: Secondary | ICD-10-CM | POA: Insufficient documentation

## 2014-04-28 DIAGNOSIS — M7918 Myalgia, other site: Secondary | ICD-10-CM

## 2014-04-28 DIAGNOSIS — Z87828 Personal history of other (healed) physical injury and trauma: Secondary | ICD-10-CM | POA: Insufficient documentation

## 2014-04-28 DIAGNOSIS — J45909 Unspecified asthma, uncomplicated: Secondary | ICD-10-CM | POA: Insufficient documentation

## 2014-04-28 MED ORDER — IBUPROFEN 800 MG PO TABS: 800.0000 mg | ORAL_TABLET | Freq: Three times a day (TID) | ORAL | Status: DC

## 2014-04-28 NOTE — ED Provider Notes (Signed)
CSN: [id number]     Arrival date & time 04/28/14  1815 History  This chart was scribed for non-physician practitioner, Elpidio Anis, PA-C, working with Samuel Jester, DO by Milly Jakob, ED Scribe. The patient was seen in room TR08C/TR08C. Patient's care was started at 6:38 PM.   Chief Complaint  Patient presents with  . Back Pain   The history is provided by the patient. No language interpreter was used.   HPI Comments: Erin Price is a 23 y.o. female who presents to the Emergency Department complaining of mid left back pain onset two weeks ago after a fall. She reports that she fell against there ribs. She states that the pain is exacerbated by breathing or standing for long periods. She reports that she was seen here after her fall and her X-Rays were negative. She denies taking any medications for pain. She denies SOB currently. She denies abdominal pain or hematuria. The patient states that she would like to be X-Rayed again.   Past Medical History  Diagnosis Date  . Seizures   . Closed head injury   . Scoliosis   . Asthma    Past Surgical History  Procedure Laterality Date  . Back surgery      screws placed for scoliosis   Family History  Problem Relation Age of Onset  . Anesthesia problems Neg Hx   . Hypertension Father   . Diabetes Maternal Grandmother   . Hypertension Maternal Grandmother   . Diabetes Paternal Grandmother   . Hypertension Paternal Grandmother    History  Substance Use Topics  . Smoking status: Never Smoker   . Smokeless tobacco: Never Used  . Alcohol Use: No   OB History   Grav Para Term Preterm Abortions TAB SAB Ect Mult Living   2 2 1 1      2      Review of Systems  Respiratory: Negative for cough and shortness of breath.   Gastrointestinal: Negative for abdominal pain.  Genitourinary: Negative for hematuria and flank pain.  Musculoskeletal: Positive for back pain.  All other systems reviewed and are negative.     Allergies   Review of patient's allergies indicates no known allergies.  Home Medications   Prior to Admission medications   Medication Sig Start Date End Date Taking? Authorizing Provider  etonogestrel (NEXPLANON) 68 MG IMPL implant Inject 1 each into the skin once.    Historical Provider, MD  ibuprofen (ADVIL,MOTRIN) 200 MG tablet Take 200 mg by mouth every 8 (eight) hours as needed for moderate pain.     Historical Provider, MD  naproxen (NAPROSYN) 500 MG tablet Take 1 tablet (500 mg total) by mouth 2 (two) times daily as needed for mild pain, moderate pain or headache (TAKE WITH MEALS.). 04/14/14   Mercedes Strupp Camprubi-Soms, PA-C  oxyCODONE-acetaminophen (PERCOCET) 5-325 MG per tablet Take 1-2 tablets by mouth every 6 (six) hours as needed for severe pain. 04/14/14   Mercedes Strupp Camprubi-Soms, PA-C   Triage vitals: BP 108/64  Pulse 77  Temp(Src) 98.7 F (37.1 C)  Resp 18  Ht 5\' 1"  (1.549 m)  Wt 118 lb (53.524 kg)  BMI 22.31 kg/m2  SpO2 100%  LMP 04/12/2014 Physical Exam  Nursing note and vitals reviewed. Constitutional: She is oriented to person, place, and time. She appears well-developed and well-nourished. No distress.  HENT:  Head: Normocephalic and atraumatic.  Eyes: Conjunctivae and EOM are normal.  Neck: Neck supple. No tracheal deviation present.  Cardiovascular: Normal rate.  Pulmonary/Chest: Effort normal. No respiratory distress.  Breath sounds are clear and full.   Abdominal: Soft. There is no tenderness.  Musculoskeletal: Normal range of motion.  Minimal left lateral mid back tenderness. No ecchymosis. No swelling. No anterior rib tenderness. No CVA tenderness.   Neurological: She is alert and oriented to person, place, and time.  Skin: Skin is warm and dry.  Psychiatric: She has a normal mood and affect. Her behavior is normal.    ED Course  Procedures (including critical care time) DIAGNOSTIC STUDIES: Oxygen Saturation is 100% on room air, normal by my  interpretation.    COORDINATION OF CARE: 6:42 PM-Discussed treatment plan which includes X-Ray with pt at bedside and pt agreed to plan.   Labs Review Labs Reviewed - No data to display  Imaging Review No results found.   EKG Interpretation None      MDM   Final diagnoses:  None    1. Musculoskeletal pain  No evidence of rib fracture. No pulmonary compromise or concern for injury. No CVA tenderness or hematuria - doubt kidney injury, especially with duration of symptoms. Suspect bone contusion with persistent soreness.  I personally performed the services described in this documentation, which was scribed in my presence. The recorded information has been reviewed and is accurate.     Arnoldo HookerShari A Millicent Blazejewski, PA-C 05/14/14 1329

## 2014-04-28 NOTE — ED Notes (Signed)
Pt c/o mid left back pain x 2 weeks, was seen here for the same. Pt sts the pain hasn't gone away. Bought her own back brace and has had some pain relief but still hurts with movement. Nad, skin warm and dry, resp e/u.

## 2014-04-28 NOTE — Discharge Instructions (Signed)
Muscle Pain  Muscle pain (myalgia) may be caused by many things, including:   Overuse or muscle strain, especially if you are not in shape. This is the most common cause of muscle pain.   Injury.   Bruises.   Viruses, such as the flu.   Infectious diseases.   Fibromyalgia, which is a chronic condition that causes muscle tenderness, fatigue, and headache.   Autoimmune diseases, including lupus.   Certain drugs, including ACE inhibitors and statins.  Muscle pain may be mild or severe. In most cases, the pain lasts only a short time and goes away without treatment. To diagnose the cause of your muscle pain, your health care provider will take your medical history. This means he or she will ask you when your muscle pain began and what has been happening. If you have not had muscle pain for very long, your health care provider may want to wait before doing much testing. If your muscle pain has lasted a long time, your health care provider may want to run tests right away. If your health care provider thinks your muscle pain may be caused by illness, you may need to have additional tests to rule out certain conditions.   Treatment for muscle pain depends on the cause. Home care is often enough to relieve muscle pain. Your health care provider may also prescribe anti-inflammatory medicine.  HOME CARE INSTRUCTIONS  Watch your condition for any changes. The following actions may help to lessen any discomfort you are feeling:   Only take over-the-counter or prescription medicines as directed by your health care provider.   Apply ice to the sore muscle:   Put ice in a plastic bag.   Place a towel between your skin and the bag.   Leave the ice on for 15-20 minutes, 3-4 times a day.   You may alternate applying hot and cold packs to the muscle as directed by your health care provider.   If overuse is causing your muscle pain, slow down your activities until the pain goes away.   Remember that it is normal to feel  some muscle pain after starting a workout program. Muscles that have not been used often will be sore at first.   Do regular, gentle exercises if you are not usually active.   Warm up before exercising to lower your risk of muscle pain.   Do not continue working out if the pain is very bad. Bad pain could mean you have injured a muscle.  SEEK MEDICAL CARE IF:   Your muscle pain gets worse, and medicines do not help.   You have muscle pain that lasts longer than 3 days.   You have a rash or fever along with muscle pain.   You have muscle pain after a tick bite.   You have muscle pain while working out, even though you are in good physical condition.   You have redness, soreness, or swelling along with muscle pain.   You have muscle pain after starting a new medicine or changing the dose of a medicine.  SEEK IMMEDIATE MEDICAL CARE IF:   You have trouble breathing.   You have trouble swallowing.   You have muscle pain along with a stiff neck, fever, and vomiting.   You have severe muscle weakness or cannot move part of your body.  MAKE SURE YOU:    Understand these instructions.   Will watch your condition.   Will get help right away if you are not   questions you have with your health care provider.   Emergency Department Resource Guide 1) Find a Doctor and Pay Out of Pocket Although you won't have to find out who is covered by your insurance plan, it is a good idea to ask around and get recommendations. You will then need to call the office and see if the doctor you have chosen will accept you as a new patient and what types of options they offer for patients who are self-pay. Some doctors offer discounts or will  set up payment plans for their patients who do not have insurance, but you will need to ask so you aren't surprised when you get to your appointment.  2) Contact Your Local Health Department Not all health departments have doctors that can see patients for sick visits, but many do, so it is worth a call to see if yours does. If you don't know where your local health department is, you can check in your phone book. The CDC also has a tool to help you locate your state's health department, and many state websites also have listings of all of their local health departments.  3) Find a Walk-in Clinic If your illness is not likely to be very severe or complicated, you may want to try a walk in clinic. These are popping up all over the country in pharmacies, drugstores, and shopping centers. They're usually staffed by nurse practitioners or physician assistants that have been trained to treat common illnesses and complaints. They're usually fairly quick and inexpensive. However, if you have serious medical issues or chronic medical problems, these are probably not your best option.  No Primary Care Doctor: - Call Health Connect at  [phone] - they can help you locate a primary care doctor that  accepts your insurance, provides certain services, etc. - Physician Referral Service- [phone]  Chronic Pain Problems: Organization         Address  Phone   Notes  [facility]  [phone] Patients need to be referred by their primary care doctor.   Medication Assistance: Organization         Address  Phone   Notes  [facility] [place] Medication [facility] [address] [place], [place] [address] [phone] --Must be a resident of [place] [place] -- Must have NO insurance coverage whatsoever (no Medicaid/ Medicare, etc.) -- The pt. MUST have a primary care doctor that directs their care regularly and follows them in the community   Med[employer]ist  [phone]    [employer]  [phone]    Agencies that provide inexpensive medical care: Organization         Address  Phone   Notes  [facility]  [phone]   [facility]    [phone]   [facility] [address] [place], [place] [address] [phone]   [facility] [address], [place] [phone]   [facility]    [phone]   [facility]    [phone]   [facility]  [address], [place] Phone:  [phone], Fax:  [phone] Hours of Operation:  9 am - 6 pm, M-F.  Also accepts Medicaid/Medicare and self-pay.  [facility]  [address], [place] Phone: [phone], Fax: [phone]) [phone]. Hours of Operation:  8:30 am - 5:30 pm, M-F.  Also accepts Medicaid and self-pay.  [facility] [place] [address], [place] Phone: [phone]   [facility] [address], [place], [place] [phone], Ext. [phone] Mondays & Thursdays: 7-9 AM.  First 15 patients are seen on a first come, first serve basis.    Medicaid-accepting [facility] [place] Providers:  Organization         Address  Phone   Notes  [facility] [address], [place] [phone] Also accepts self-pay patients.  [facility] [address], [place]  [phone]   [facility] [address], [place] [phone]   [facility] [address] [place] [address], [place] [phone]   Renaye RakersVeita Bland [address], [place]   [phone] Only accepts [place] patients after they have their name applied to their card.   Self-Pay (no insurance) in [place] [facility]:  Organization         Address  Phone   Notes  Sickle Cell Patients, [facility] [address],  [place] [phone]) [phone]   [facility] [address], [place] [phone]   [facility] [place]  [address], [place] [phone]) [phone]   [facility]/Dr. Osei-Bonsu  [address] [place] [address], [place] or [phone], [place] [phone] Phone number for both [place] and [place] locations is the same.  [facility] [address], [place] [phone]   [facility] [place] [address] [place] [address], [place] or [address] [phone]   [facility] [address], [place] [phone], phone; [phone], fax Sees patients 1st and 3rd Saturday of every month.  Must not qualify for public or private insurance (i.e. Medicaid, Medicare, [place] Health Choice, Veterans' Benefits)  Household income should be no more than 200% of the poverty level The clinic cannot treat you if you are pregnant or think you are pregnant  Sexually transmitted diseases are not treated at the clinic.

## 2014-05-14 NOTE — ED Provider Notes (Signed)
Medical screening examination/treatment/procedure(s) were performed by non-physician practitioner and as supervising physician I was immediately available for consultation/collaboration.   EKG Interpretation None        Samuel JesterKathleen Caterin Tabares, DO 05/14/14 1344

## 2014-05-27 ENCOUNTER — Emergency Department (HOSPITAL_COMMUNITY)
Admission: EM | Admit: 2014-05-27 | Discharge: 2014-05-27 | Disposition: A | Discharge status: Home / Self Care | Payer: Medicaid Other

## 2014-05-27 NOTE — ED Notes (Signed)
Called for triage x 3, pt is not in the waiting room

## 2014-07-25 ENCOUNTER — Encounter (HOSPITAL_COMMUNITY): Payer: Self-pay | Admitting: Emergency Medicine

## 2014-10-31 ENCOUNTER — Encounter (HOSPITAL_COMMUNITY): Payer: Self-pay

## 2014-10-31 ENCOUNTER — Emergency Department (HOSPITAL_COMMUNITY)
Admission: EM | Admit: 2014-10-31 | Discharge: 2014-11-01 | Disposition: A | Discharge status: Home / Self Care | Payer: Medicaid Other | Attending: Emergency Medicine | Admitting: Emergency Medicine

## 2014-10-31 DIAGNOSIS — Z8739 Personal history of other diseases of the musculoskeletal system and connective tissue: Secondary | ICD-10-CM | POA: Insufficient documentation

## 2014-10-31 DIAGNOSIS — Z3202 Encounter for pregnancy test, result negative: Secondary | ICD-10-CM | POA: Insufficient documentation

## 2014-10-31 DIAGNOSIS — R319 Hematuria, unspecified: Secondary | ICD-10-CM | POA: Insufficient documentation

## 2014-10-31 DIAGNOSIS — Z87828 Personal history of other (healed) physical injury and trauma: Secondary | ICD-10-CM | POA: Diagnosis not present

## 2014-10-31 DIAGNOSIS — R109 Unspecified abdominal pain: Secondary | ICD-10-CM

## 2014-10-31 DIAGNOSIS — N8320 Unspecified ovarian cysts: Secondary | ICD-10-CM | POA: Insufficient documentation

## 2014-10-31 DIAGNOSIS — J45909 Unspecified asthma, uncomplicated: Secondary | ICD-10-CM | POA: Insufficient documentation

## 2014-10-31 DIAGNOSIS — Z8669 Personal history of other diseases of the nervous system and sense organs: Secondary | ICD-10-CM | POA: Insufficient documentation

## 2014-10-31 DIAGNOSIS — N83201 Unspecified ovarian cyst, right side: Secondary | ICD-10-CM

## 2014-10-31 LAB — BASIC METABOLIC PANEL
Anion gap: 5 (ref 5–15)
BUN: 16 mg/dL (ref 6–23)
CO2: 30 mmol/L (ref 19–32)
Calcium: 9.3 mg/dL (ref 8.4–10.5)
Chloride: 103 mmol/L (ref 96–112)
Creatinine, Ser: 0.9 mg/dL (ref 0.50–1.10)
GFR calc Af Amer: >90 mL/min (ref >90)
GFR calc non Af Amer: 89 mL/min — ABNORMAL LOW (ref >90)
Glucose, Bld: 87 mg/dL (ref 70–99)
Potassium: 3.5 mmol/L (ref 3.5–5.1)
Sodium: 138 mmol/L (ref 135–145)

## 2014-10-31 LAB — CBC WITH DIFFERENTIAL/PLATELET
Basophils Absolute: 0 10*3/uL (ref 0.0–0.1)
Basophils Relative: 0 % (ref 0–1)
Eosinophils Absolute: 0.2 10*3/uL (ref 0.0–0.7)
Eosinophils Relative: 2 % (ref 0–5)
HCT: 42 % (ref 36.0–46.0)
Hemoglobin: 13.9 g/dL (ref 12.0–15.0)
Lymphocytes Relative: 22 % (ref 12–46)
Lymphs Abs: 2.5 10*3/uL (ref 0.7–4.0)
MCH: 31.4 pg (ref 26.0–34.0)
MCHC: 33.1 g/dL (ref 30.0–36.0)
MCV: 95 fL (ref 78.0–100.0)
Monocytes Absolute: 0.8 10*3/uL (ref 0.1–1.0)
Monocytes Relative: 7 % (ref 3–12)
Neutro Abs: 7.7 10*3/uL (ref 1.7–7.7)
Neutrophils Relative %: 69 % (ref 43–77)
Platelets: 243 10*3/uL (ref 150–400)
RBC: 4.42 MIL/uL (ref 3.87–5.11)
RDW: 13 % (ref 11.5–15.5)
WBC: 11.3 10*3/uL — ABNORMAL HIGH (ref 4.0–10.5)

## 2014-10-31 LAB — URINALYSIS, ROUTINE W REFLEX MICROSCOPIC
Bilirubin Urine: NEGATIVE
Glucose, UA: NEGATIVE mg/dL
Ketones, ur: NEGATIVE mg/dL
Nitrite: NEGATIVE
Protein, ur: NEGATIVE mg/dL
Specific Gravity, Urine: 1.025 (ref 1.005–1.030)
Urobilinogen, UA: 1 mg/dL (ref 0.0–1.0)
pH: 7 (ref 5.0–8.0)

## 2014-10-31 LAB — AMYLASE: Amylase: 71 U/L (ref 0–105)

## 2014-10-31 LAB — URINE MICROSCOPIC-ADD ON

## 2014-10-31 LAB — PREGNANCY, URINE: Preg Test, Ur: NEGATIVE

## 2014-10-31 LAB — LIPASE, BLOOD: Lipase: 32 U/L (ref 11–59)

## 2014-10-31 NOTE — ED Notes (Signed)
Pt complains of vomiting earlier today ans right lower quad pain since yesterday

## 2014-11-01 ENCOUNTER — Inpatient Hospital Stay (EMERGENCY_DEPARTMENT_HOSPITAL)
Admission: AD | Admit: 2014-11-01 | Discharge: 2014-11-01 | Disposition: A | Discharge status: Home / Self Care | Payer: Medicaid Other | Source: Ambulatory Visit | Attending: Family Medicine | Admitting: Family Medicine

## 2014-11-01 ENCOUNTER — Emergency Department (HOSPITAL_COMMUNITY): Payer: Medicaid Other

## 2014-11-01 DIAGNOSIS — N8320 Unspecified ovarian cysts: Secondary | ICD-10-CM | POA: Diagnosis not present

## 2014-11-01 DIAGNOSIS — N83209 Unspecified ovarian cyst, unspecified side: Secondary | ICD-10-CM

## 2014-11-01 MED ORDER — IBUPROFEN 800 MG PO TABS: 800.0000 mg | ORAL_TABLET | Freq: Three times a day (TID) | ORAL | Status: DC | PRN

## 2014-11-01 NOTE — Discharge Instructions (Signed)
Ovarian Cyst An ovarian cyst is a fluid-filled sac that forms on an ovary. The ovaries are small organs that produce eggs in women. Various types of cysts can form on the ovaries. Most are not cancerous. Many do not cause problems, and they often go away on their own. Some may cause symptoms and require treatment. Common types of ovarian cysts include:  Functional cysts--These cysts may occur every month during the menstrual cycle. This is normal. The cysts usually go away with the next menstrual cycle if the woman does not get pregnant. Usually, there are no symptoms with a functional cyst.  Endometrioma cysts--These cysts form from the tissue that lines the uterus. They are also called "chocolate cysts" because they become filled with blood that turns brown. This type of cyst can cause pain in the lower abdomen during intercourse and with your menstrual period.  Cystadenoma cysts--This type develops from the cells on the outside of the ovary. These cysts can get very big and cause lower abdomen pain and pain with intercourse. This type of cyst can twist on itself, cut off its blood supply, and cause severe pain. It can also easily rupture and cause a lot of pain.  Dermoid cysts--This type of cyst is sometimes found in both ovaries. These cysts may contain different kinds of body tissue, such as skin, teeth, hair, or cartilage. They usually do not cause symptoms unless they get very big.  Theca lutein cysts--These cysts occur when too much of a certain hormone (human chorionic gonadotropin) is produced and overstimulates the ovaries to produce an egg. This is most common after procedures used to assist with the conception of a baby (in vitro fertilization). CAUSES   Fertility drugs can cause a condition in which multiple large cysts are formed on the ovaries. This is called ovarian hyperstimulation syndrome.  A condition called polycystic ovary syndrome can cause hormonal imbalances that can lead to  nonfunctional ovarian cysts. SIGNS AND SYMPTOMS  Many ovarian cysts do not cause symptoms. If symptoms are present, they may include:  Pelvic pain or pressure.  Pain in the lower abdomen.  Pain during sexual intercourse.  Increasing girth (swelling) of the abdomen.  Abnormal menstrual periods.  Increasing pain with menstrual periods.  Stopping having menstrual periods without being pregnant. DIAGNOSIS  These cysts are commonly found during a routine or annual pelvic exam. Tests may be ordered to find out more about the cyst. These tests may include:  Ultrasound.  X-ray of the pelvis.  CT scan.  MRI.  Blood tests. TREATMENT  Many ovarian cysts go away on their own without treatment. Your health care provider may want to check your cyst regularly for 2-3 months to see if it changes. For women in menopause, it is particularly important to monitor a cyst closely because of the higher rate of ovarian cancer in menopausal women. When treatment is needed, it may include any of the following:  A procedure to drain the cyst (aspiration). This may be done using a long needle and ultrasound. It can also be done through a laparoscopic procedure. This involves using a thin, lighted tube with a tiny camera on the end (laparoscope) inserted through a small incision.  Surgery to remove the whole cyst. This may be done using laparoscopic surgery or an open surgery involving a larger incision in the lower abdomen.  Hormone treatment or birth control pills. These methods are sometimes used to help dissolve a cyst. HOME CARE INSTRUCTIONS   Only take over-the-counter   or prescription medicines as directed by your health care provider.  Follow up with your health care provider as directed.  Get regular pelvic exams and Pap tests. SEEK MEDICAL CARE IF:   Your periods are late, irregular, or painful, or they stop.  Your pelvic pain or abdominal pain does not go away.  Your abdomen becomes  larger or swollen.  You have pressure on your bladder or trouble emptying your bladder completely.  You have pain during sexual intercourse.  You have feelings of fullness, pressure, or discomfort in your stomach.  You lose weight for no apparent reason.  You feel generally ill.  You become constipated.  You lose your appetite.  You develop acne.  You have an increase in body and facial hair.  You are gaining weight, without changing your exercise and eating habits.  You think you are pregnant. SEEK IMMEDIATE MEDICAL CARE IF:   You have increasing abdominal pain.  You feel sick to your stomach (nauseous), and you throw up (vomit).  You develop a fever that comes on suddenly.  You have abdominal pain during a bowel movement.  Your menstrual periods become heavier than usual. MAKE SURE YOU:  Understand these instructions.  Will watch your condition.  Will get help right away if you are not doing well or get worse. Document Released: 09/09/2005 Document Revised: 09/14/2013 Document Reviewed: 05/17/2013 ExitCare Patient Information 2015 [employer]. This information is not intended to replace advice given to you by your health care provider. Make sure you discuss any questions you have with your health care provider.  

## 2014-11-01 NOTE — MAU Note (Signed)
[facility], was told to come here for US.   Had reviewed note- follow up was to be in 1 month.  Still having pain, has been constipated; diet and OTC meds discussed.  Reports LMP was ~ 6 wks ago. Has noted bleeding with urination, due to neg preg test and no stones on US yesterday- probable start of period.  [facility] plan was to f/u repeat US in one month.  Elenora FenderKarim CNM in to talk with pt.

## 2014-11-01 NOTE — MAU Provider Note (Signed)
  History     CSN: [id number]  Arrival date and time: 11/01/14 1122   First Provider Initiated Contact with Patient 11/01/14 1156      Chief Complaint  Patient presents with  . Follow-up   HPI  Pt is a 24 yo G2P1102 here for follow-up.  [facility]  CT scan was completed to assess for kidney stones.  A 3 cm ovarian cyst was noted and patient was informed [facility].  Patient here today not realizing the follow-up was for a month.  Past Medical History  Diagnosis Date  . Seizures   . Closed head injury   . Scoliosis   . Asthma     Past Surgical History  Procedure Laterality Date  . Back surgery      screws placed for scoliosis    Family History  Problem Relation Age of Onset  . Anesthesia problems Neg Hx   . Hypertension Father   . Diabetes Maternal Grandmother   . Hypertension Maternal Grandmother   . Diabetes Paternal Grandmother   . Hypertension Paternal Grandmother     History  Substance Use Topics  . Smoking status: Never Smoker   . Smokeless tobacco: Never Used  . Alcohol Use: No    Allergies: No Known Allergies  Prescriptions prior to admission  Medication Sig Dispense Refill Last Dose  . ibuprofen (ADVIL,MOTRIN) 800 MG tablet Take 1 tablet (800 mg total) by mouth every 8 (eight) hours as needed. 21 tablet 0     Review of Systems  Gastrointestinal: Positive for abdominal pain (cramping).  Genitourinary:       Scant vaginal bleeding  All other systems reviewed and are negative.  Physical Exam   Blood pressure 111/74, pulse 86, temperature 99.5 F (37.5 C), temperature source Oral, resp. rate 18, height 5\' 1"  (1.549 m), weight 58.514 kg (129 lb), last menstrual period 09/22/2014, SpO2 100 %.  Physical Exam  Constitutional: She is oriented to person, place, and time. She appears well-developed and well-nourished. No distress.  HENT:  Head: Normocephalic.  Neck: Normal range of  motion. Neck supple.  Neurological: She is alert and oriented to person, place, and time. She has normal reflexes.  Skin: Skin is warm and dry.    MAU Course  Procedures   Assessment and Plan  Ovarian Cyst  Plan: Discussed ovarian cyst and possible causes. Follow-up ultrasound scheduled for 11/30/14 at 11:15   Marlis EdelsonKARIM, Jamilette Suchocki N 11/01/2014, 11:58 AM

## 2014-11-01 NOTE — ED Provider Notes (Signed)
CSN: [id number]     Arrival date & time 10/31/14  2030 History   First MD Initiated Contact with Patient 11/01/14 0057     Chief Complaint  Patient presents with  . Flank Pain     (Consider location/radiation/quality/duration/timing/severity/associated sxs/prior Treatment) HPI Comments: Patient reports that she's had right flank pain and right lower quadrant pain since yesterday's waxing and waning in intensity.  She noticed some hematuria as well.  Denies any fever, nausea, vomiting.  Last period was in the past week and normal in nature.  Denies any vaginal discharge  Patient is a 24 y.o. female presenting with flank pain. The history is provided by the patient.  Flank Pain This is a new problem. The current episode started today. The problem occurs constantly. The problem has been waxing and waning. Associated symptoms include abdominal pain and nausea. Pertinent negatives include no coughing, vomiting or weakness. Nothing aggravates the symptoms. She has tried nothing for the symptoms. The treatment provided no relief.    Past Medical History  Diagnosis Date  . Seizures   . Closed head injury   . Scoliosis   . Asthma    Past Surgical History  Procedure Laterality Date  . Back surgery      screws placed for scoliosis   Family History  Problem Relation Age of Onset  . Anesthesia problems Neg Hx   . Hypertension Father   . Diabetes Maternal Grandmother   . Hypertension Maternal Grandmother   . Diabetes Paternal Grandmother   . Hypertension Paternal Grandmother    History  Substance Use Topics  . Smoking status: Never Smoker   . Smokeless tobacco: Never Used  . Alcohol Use: No   OB History    Gravida Para Term Preterm AB TAB SAB Ectopic Multiple Living   2 2 1 1      2      Review of Systems  Respiratory: Negative for cough and shortness of breath.   Gastrointestinal: Positive for nausea and abdominal pain. Negative for vomiting, diarrhea, constipation and blood in  stool.  Genitourinary: Positive for hematuria and flank pain. Negative for dysuria, vaginal bleeding, vaginal discharge and vaginal pain.  Neurological: Negative for weakness.  All other systems reviewed and are negative.     Allergies  Review of patient's allergies indicates no known allergies.  Home Medications   Prior to Admission medications   Medication Sig Start Date End Date Taking? Authorizing Provider  ibuprofen (ADVIL,MOTRIN) 800 MG tablet Take 1 tablet (800 mg total) by mouth every 8 (eight) hours as needed. 11/01/14   Arman FilterGail K Shaniya Tashiro, NP   BP 112/67 mmHg  Pulse 80  Temp(Src) 98.5 F (36.9 C) (Oral)  Resp 20  SpO2 99%  LMP 09/22/2014 Physical Exam  Constitutional: She is oriented to person, place, and time. She appears well-developed and well-nourished.  HENT:  Head: Normocephalic.  Eyes: Pupils are equal, round, and reactive to light.  Neck: Normal range of motion.  Cardiovascular: Normal rate and regular rhythm.   Pulmonary/Chest: Effort normal.  Abdominal: Soft. She exhibits no distension. There is no tenderness.  Musculoskeletal: Normal range of motion.  Neurological: She is alert and oriented to person, place, and time.  Skin: Skin is warm.  Nursing note and vitals reviewed.   ED Course  Procedures (including critical care time) Labs Review Labs Reviewed  BASIC METABOLIC PANEL - Abnormal; Notable for the following:    GFR calc non Af Amer 89 (*)    All other  components within normal limits  CBC WITH DIFFERENTIAL/PLATELET - Abnormal; Notable for the following:    WBC 11.3 (*)    All other components within normal limits  URINALYSIS, ROUTINE W REFLEX MICROSCOPIC - Abnormal; Notable for the following:    APPearance CLOUDY (*)    Hgb urine dipstick MODERATE (*)    Leukocytes, UA MODERATE (*)    All other components within normal limits  AMYLASE  LIPASE, BLOOD  PREGNANCY, URINE  URINE MICROSCOPIC-ADD ON    Imaging Review Ct Renal Stone  Study  11/01/2014   CLINICAL DATA:  Right flank pain, nausea and vomiting, elevated white cell count. Red cells and white cells on urinalysis.  EXAM: CT ABDOMEN AND PELVIS WITHOUT CONTRAST  TECHNIQUE: Multidetector CT imaging of the abdomen and pelvis was performed following the standard protocol without IV contrast.  COMPARISON:  None.  FINDINGS: Mild dependent changes in the lung bases. Kidneys are symmetrical in size and shape. No hydronephrosis or hydroureter. No renal, ureteral, or bladder stones. No bladder wall thickening are a  The unenhanced appearance of the liver, spleen, gallbladder, pancreas, adrenal glands, abdominal aorta, inferior vena cava, and retroperitoneal lymph nodes is unremarkable. Stomach, small bowel, and colon are not abnormally distended. Stool-filled colon. Ingested material in the stomach and duodenum. No free air or free fluid in the abdomen.  Pelvis: Uterus and ovaries are not enlarged. Probable functional cyst in the right ovary measuring less than 3 cm diameter. No free or loculated pelvic fluid collections. No pelvic mass or lymphadenopathy. Appendix is normal. No evidence of diverticulitis. Postoperative changes with lateral plate and screw fixation of the left side of T12 through L3 vertebrae. Mild convexity of the lumbar spine towards the left. No destructive bone lesions.  IMPRESSION: No renal or ureteral stone or obstruction.   Electronically Signed   By: Burman Nieves M.D.   On: 11/01/2014 01:52     EKG Interpretation None     Will obtain renal stone study Renal stone study shows that she has a large right ovarian cyst.  She will be referred to [facility] for follow-up in approximately one month.  She's been instructed to take anti-inflammatories on a regular basis MDM   Final diagnoses:  Flank pain  Ovarian cyst, right         Arman Filter, NP 11/01/14 1610  Raeford Razor, MD 11/03/14 1009

## 2014-11-01 NOTE — ED Notes (Signed)
When entering the room, pt resting quietly. Pt using cell phone.

## 2014-11-01 NOTE — ED Notes (Signed)
When entering the room, pt using cell phone and resting quietly.  

## 2014-11-27 ENCOUNTER — Emergency Department (HOSPITAL_COMMUNITY)
Admission: EM | Admit: 2014-11-27 | Discharge: 2014-11-27 | Disposition: A | Discharge status: Home / Self Care | Payer: Medicaid Other | Attending: Emergency Medicine | Admitting: Emergency Medicine

## 2014-11-27 ENCOUNTER — Encounter (HOSPITAL_COMMUNITY): Payer: Self-pay | Admitting: *Deleted

## 2014-11-27 DIAGNOSIS — J45909 Unspecified asthma, uncomplicated: Secondary | ICD-10-CM | POA: Diagnosis not present

## 2014-11-27 DIAGNOSIS — M791 Myalgia: Secondary | ICD-10-CM | POA: Diagnosis not present

## 2014-11-27 DIAGNOSIS — R509 Fever, unspecified: Secondary | ICD-10-CM | POA: Diagnosis present

## 2014-11-27 DIAGNOSIS — B349 Viral infection, unspecified: Secondary | ICD-10-CM | POA: Insufficient documentation

## 2014-11-27 DIAGNOSIS — J029 Acute pharyngitis, unspecified: Secondary | ICD-10-CM | POA: Diagnosis not present

## 2014-11-27 DIAGNOSIS — Z87828 Personal history of other (healed) physical injury and trauma: Secondary | ICD-10-CM | POA: Insufficient documentation

## 2014-11-27 LAB — RAPID STREP SCREEN (MED CTR MEBANE ONLY): Streptococcus, Group A Screen (Direct): NEGATIVE

## 2014-11-27 MED ORDER — ONDANSETRON 4 MG PO TBDP
4.0000 mg | ORAL_TABLET | Freq: Once | ORAL | Status: AC
  Administered 2014-11-27: 4 mg via ORAL
  Filled 2014-11-27: qty 1

## 2014-11-27 MED ORDER — IBUPROFEN 200 MG PO TABS
600.0000 mg | ORAL_TABLET | Freq: Once | ORAL | Status: AC
  Administered 2014-11-27: 600 mg via ORAL
  Filled 2014-11-27: qty 3

## 2014-11-27 NOTE — Discharge Instructions (Signed)
Your strep test is negative. °

## 2014-11-27 NOTE — ED Notes (Signed)
Pt states that she began having a fever, sore throat, body aches yesterday; pt has congestion; pt denies taking medications at home for symptoms

## 2014-11-27 NOTE — ED Provider Notes (Signed)
CSN: [id number]     Arrival date & time 11/27/14  2100 History  This chart was scribed for non-physician practitioner, Earley FavorGail Goebel Hellums, FNP,working with Purvis SheffieldForrest Harrison, MD, by Karle PlumberJennifer Tensley, ED Scribe. This patient was seen in room WTR6/WTR6 and the patient's care was started at 10:37 PM.  Chief Complaint  Patient presents with  . Flu like Symptoms    The history is provided by the patient and medical records. No language interpreter was used.    HPI Comments:  Erin Price is a 24 y.o. female who presents to the Emergency Department complaining of fever, sore throat and generalized body aches that began yesterday. She reports associated rhinorrhea, congestion and nausea. She has not taken any medications to treat her symptoms. Pt states her son has been sick with similar symptoms. She denies modifying factors. Denies vomiting or diarrhea. PMHx of seizures, closed head injury, scoliosis and asthma in which she does not use an inhaler. LMP was four days ago.   Past Medical History  Diagnosis Date  . Seizures   . Closed head injury   . Scoliosis   . Asthma    Past Surgical History  Procedure Laterality Date  . Back surgery      screws placed for scoliosis   Family History  Problem Relation Age of Onset  . Anesthesia problems Neg Hx   . Hypertension Father   . Diabetes Maternal Grandmother   . Hypertension Maternal Grandmother   . Diabetes Paternal Grandmother   . Hypertension Paternal Grandmother    History  Substance Use Topics  . Smoking status: Never Smoker   . Smokeless tobacco: Never Used  . Alcohol Use: No   OB History    Gravida Para Term Preterm AB TAB SAB Ectopic Multiple Living   2 2 1 1      2      Review of Systems  Constitutional: Positive for fever.  HENT: Positive for congestion and sore throat.   Gastrointestinal: Positive for nausea. Negative for vomiting and diarrhea.  Musculoskeletal: Positive for myalgias.  All other systems reviewed and are  negative.   Allergies  Review of patient's allergies indicates no known allergies.  Home Medications   Prior to Admission medications   Medication Sig Start Date End Date Taking? Authorizing Provider  ibuprofen (ADVIL,MOTRIN) 800 MG tablet Take 1 tablet (800 mg total) by mouth every 8 (eight) hours as needed. 11/01/14  Yes Arman FilterGail K Elasha Tess, NP   Triage Vitals: BP 114/55 mmHg  Pulse 113  Temp(Src) 101.5 F (38.6 C) (Oral)  Resp 18  Ht 5\' 1"  (1.549 m)  Wt 126 lb (57.153 kg)  BMI 23.82 kg/m2  SpO2 99%  LMP 09/19/2014 Physical Exam  Constitutional: She is oriented to person, place, and time. She appears well-developed and well-nourished.  HENT:  Head: Normocephalic and atraumatic.  Eyes: EOM are normal.  Neck: Normal range of motion.  Cardiovascular: Normal rate.   Pulmonary/Chest: Effort normal.  Musculoskeletal: Normal range of motion.  Neurological: She is alert and oriented to person, place, and time.  Skin: Skin is warm and dry.  Psychiatric: She has a normal mood and affect. Her behavior is normal.  Nursing note and vitals reviewed.   ED Course  Procedures (including critical care time) DIAGNOSTIC STUDIES: Oxygen Saturation is 99% on RA, normal by my interpretation.   COORDINATION OF CARE: 10:39 PM- Will wait for strep test to result. Pt verbalizes understanding and agrees to plan.  Medications  ibuprofen (ADVIL,MOTRIN) tablet 600  mg (600 mg Oral Given 11/27/14 2308)  ondansetron (ZOFRAN-ODT) disintegrating tablet 4 mg (4 mg Oral Given 11/27/14 2308)    Labs Review Labs Reviewed  RAPID STREP SCREEN  CULTURE, GROUP A STREP    Imaging Review No results found.   EKG Interpretation None      MDM   Final diagnoses:  Pharyngitis  Viral illness       I personally performed the services described in this documentation, which was scribed in my presence. The recorded information has been reviewed and is accurate.    Arman Filter, NP 11/27/14 1610  Olivia Mackie, MD 11/28/14 [phone]

## 2014-11-30 ENCOUNTER — Telehealth: Payer: Self-pay | Admitting: General Practice

## 2014-11-30 ENCOUNTER — Other Ambulatory Visit (HOSPITAL_COMMUNITY): Payer: Self-pay | Admitting: Family

## 2014-11-30 ENCOUNTER — Ambulatory Visit (HOSPITAL_COMMUNITY)
Admit: 2014-11-30 | Discharge: 2014-11-30 | Disposition: A | Discharge status: Home / Self Care | Payer: Medicaid Other | Attending: Family | Admitting: Family

## 2014-11-30 DIAGNOSIS — N8320 Unspecified ovarian cysts: Secondary | ICD-10-CM | POA: Insufficient documentation

## 2014-11-30 DIAGNOSIS — N854 Malposition of uterus: Secondary | ICD-10-CM | POA: Diagnosis not present

## 2014-11-30 DIAGNOSIS — N83209 Unspecified ovarian cyst, unspecified side: Secondary | ICD-10-CM

## 2014-11-30 LAB — CULTURE, GROUP A STREP

## 2014-11-30 NOTE — Telephone Encounter (Signed)
-----   Message from Lenoard Adenlivia M Johnson, [place] sent at 11/30/2014 11:38 AM EST ----- Regarding: U/S Results We have just completed a pelvic outpatient ultrasound scheduled for a patient who was seen in [facility].  Please call the patient with the results.

## 2014-11-30 NOTE — Telephone Encounter (Signed)
Called patient and informed her of normal ultrasound. Patient verbalized understanding and had no questions

## 2015-03-08 ENCOUNTER — Emergency Department (HOSPITAL_COMMUNITY): Payer: Medicaid Other

## 2015-03-08 ENCOUNTER — Encounter (HOSPITAL_COMMUNITY): Payer: Self-pay

## 2015-03-08 ENCOUNTER — Emergency Department (HOSPITAL_COMMUNITY)
Admission: EM | Admit: 2015-03-08 | Discharge: 2015-03-09 | Disposition: A | Discharge status: Home / Self Care | Payer: Medicaid Other | Attending: Emergency Medicine | Admitting: Emergency Medicine

## 2015-03-08 DIAGNOSIS — Y939 Activity, unspecified: Secondary | ICD-10-CM | POA: Diagnosis not present

## 2015-03-08 DIAGNOSIS — J45909 Unspecified asthma, uncomplicated: Secondary | ICD-10-CM | POA: Insufficient documentation

## 2015-03-08 DIAGNOSIS — Z8739 Personal history of other diseases of the musculoskeletal system and connective tissue: Secondary | ICD-10-CM | POA: Diagnosis not present

## 2015-03-08 DIAGNOSIS — W1839XA Other fall on same level, initial encounter: Secondary | ICD-10-CM | POA: Diagnosis not present

## 2015-03-08 DIAGNOSIS — R55 Syncope and collapse: Secondary | ICD-10-CM | POA: Diagnosis not present

## 2015-03-08 DIAGNOSIS — Z3202 Encounter for pregnancy test, result negative: Secondary | ICD-10-CM | POA: Insufficient documentation

## 2015-03-08 DIAGNOSIS — R569 Unspecified convulsions: Secondary | ICD-10-CM | POA: Insufficient documentation

## 2015-03-08 DIAGNOSIS — Y999 Unspecified external cause status: Secondary | ICD-10-CM | POA: Diagnosis not present

## 2015-03-08 DIAGNOSIS — W19XXXA Unspecified fall, initial encounter: Secondary | ICD-10-CM

## 2015-03-08 DIAGNOSIS — Y9289 Other specified places as the place of occurrence of the external cause: Secondary | ICD-10-CM | POA: Diagnosis not present

## 2015-03-08 DIAGNOSIS — S0990XA Unspecified injury of head, initial encounter: Secondary | ICD-10-CM | POA: Insufficient documentation

## 2015-03-08 LAB — CBC WITH DIFFERENTIAL/PLATELET
Basophils Absolute: 0 10*3/uL (ref 0.0–0.1)
Basophils Relative: 0 % (ref 0–1)
Eosinophils Absolute: 0.2 10*3/uL (ref 0.0–0.7)
Eosinophils Relative: 2 % (ref 0–5)
HCT: 42 % (ref 36.0–46.0)
Hemoglobin: 14.2 g/dL (ref 12.0–15.0)
Lymphocytes Relative: 40 % (ref 12–46)
Lymphs Abs: 3.5 10*3/uL (ref 0.7–4.0)
MCH: 31.7 pg (ref 26.0–34.0)
MCHC: 33.8 g/dL (ref 30.0–36.0)
MCV: 93.8 fL (ref 78.0–100.0)
Monocytes Absolute: 0.6 10*3/uL (ref 0.1–1.0)
Monocytes Relative: 7 % (ref 3–12)
Neutro Abs: 4.4 10*3/uL (ref 1.7–7.7)
Neutrophils Relative %: 51 % (ref 43–77)
Platelets: 171 10*3/uL (ref 150–400)
RBC: 4.48 MIL/uL (ref 3.87–5.11)
RDW: 12.8 % (ref 11.5–15.5)
WBC: 8.6 10*3/uL (ref 4.0–10.5)

## 2015-03-08 LAB — BASIC METABOLIC PANEL
Anion gap: 14 (ref 5–15)
BUN: 11 mg/dL (ref 6–20)
CO2: 22 mmol/L (ref 22–32)
Calcium: 9.4 mg/dL (ref 8.9–10.3)
Chloride: 104 mmol/L (ref 101–111)
Creatinine, Ser: 0.73 mg/dL (ref 0.44–1.00)
GFR calc Af Amer: >60 mL/min (ref >60)
GFR calc non Af Amer: >60 mL/min (ref >60)
Glucose, Bld: 101 mg/dL — ABNORMAL HIGH (ref 65–99)
Potassium: 4.1 mmol/L (ref 3.5–5.1)
Sodium: 140 mmol/L (ref 135–145)

## 2015-03-08 LAB — POC URINE PREG, ED: Preg Test, Ur: NEGATIVE

## 2015-03-08 NOTE — ED Notes (Signed)
Patient arrived to [facility] ED waiting area and was registered, per bystander patient fell to floor and appeared to have a seizure.  This Clinical research associate and Dr. Loretha Stapler to waiting area, patient found on stomach with head to left side.  C-spine precautions maintained, c-collar applied and placed on backboard and the stretcher and to Res B.  Patient with eyes fluttering, opening eyes with soft speech.  Patient speaking slowly, states dropped off by a friend due to a left sided headache all day today.  Patient states hx seizures, [place] states off Depakote for the past 3 years.

## 2015-03-08 NOTE — ED Provider Notes (Signed)
CSN: [id number]     Arrival date & time 03/08/15  2122 History   First MD Initiated Contact with Patient 03/08/15 2213     Chief Complaint  Patient presents with  . Seizures  . Headache     (Consider location/radiation/quality/duration/timing/severity/associated sxs/prior Treatment) HPI Comments: 24 yo female who presents after falling while in the waiting room of the ED.  History limited initially due to her altered mental status.  A bystander thought she may have had a "seizure", but did not provide any additional history.  Level V caveat due to altered mental status.  Patient is a 24 y.o. female presenting with syncope.  Loss of Consciousness Episode history:  Single Most recent episode:  Today Duration:  2 minutes Timing:  Constant Progression:  Resolved Chronicity:  New Context comment:  Standing up to go to the bathroom Witnessed: yes   Relieved by:  Lying down Associated symptoms: headaches and malaise/fatigue   Associated symptoms: no fever   Associated symptoms comment:  Headache   Past Medical History  Diagnosis Date  . Seizures   . Closed head injury   . Scoliosis   . Asthma    Past Surgical History  Procedure Laterality Date  . Back surgery      screws placed for scoliosis   Family History  Problem Relation Age of Onset  . Anesthesia problems Neg Hx   . Hypertension Father   . Diabetes Maternal Grandmother   . Hypertension Maternal Grandmother   . Diabetes Paternal Grandmother   . Hypertension Paternal Grandmother    History  Substance Use Topics  . Smoking status: Never Smoker   . Smokeless tobacco: Never Used  . Alcohol Use: No   OB History    Gravida Para Term Preterm AB TAB SAB Ectopic Multiple Living   2 2 1 1      2      Review of Systems  Unable to perform ROS: Mental status change  Constitutional: Positive for malaise/fatigue. Negative for fever.  Cardiovascular: Positive for syncope.  Neurological: Positive for headaches.       Allergies  Review of patient's allergies indicates no known allergies.  Home Medications   Prior to Admission medications   Medication Sig Start Date End Date Taking? Authorizing Provider  ibuprofen (ADVIL,MOTRIN) 800 MG tablet Take 1 tablet (800 mg total) by mouth every 8 (eight) hours as needed. Patient not taking: Reported on 03/08/2015 11/01/14   Earley Favor, NP   BP 127/74 mmHg  Pulse 86  Temp(Src) 98 F (36.7 C) (Oral)  Resp 22  SpO2 99%  LMP 03/03/2015 Physical Exam  Constitutional: She appears well-developed and well-nourished. No distress.  HENT:  Head: Normocephalic and atraumatic.  Mouth/Throat: Oropharynx is clear and moist.  Eyes: Conjunctivae are normal. Pupils are equal, round, and reactive to light. No scleral icterus.  Neck: Neck supple.  Cardiovascular: Normal rate, regular rhythm, normal heart sounds and intact distal pulses.   No murmur heard. Pulmonary/Chest: Effort normal and breath sounds normal. No stridor. No respiratory distress. She has no rales.  Abdominal: Soft. Bowel sounds are normal. She exhibits no distension. There is no tenderness.  Musculoskeletal: Normal range of motion.  Neurological:  Fluttering eyes, not answering questions, but able to actively control arm when lifted above body.    Skin: Skin is warm and dry. No rash noted.  Nursing note and vitals reviewed.   ED Course  Procedures (including critical care time) Labs Review Labs Reviewed  BASIC METABOLIC  PANEL - Abnormal; Notable for the following:    Glucose, Bld 101 (*)    All other components within normal limits  URINALYSIS, ROUTINE W REFLEX MICROSCOPIC (NOT AT [facility]) - Abnormal; Notable for the following:    Hgb urine dipstick LARGE (*)    All other components within normal limits  URINE MICROSCOPIC-ADD ON - Abnormal; Notable for the following:    Bacteria, UA FEW (*)    All other components within normal limits  CBC WITH DIFFERENTIAL/PLATELET  POC URINE PREG, ED   POC URINE PREG, ED    Imaging Review Ct Head Wo Contrast  03/08/2015   CLINICAL DATA:  Headache for 1 day. Fall to floor in emergency department, witnessed seizure, history of seizures. Off Depakote for 3 years.  EXAM: CT HEAD WITHOUT CONTRAST  CT CERVICAL SPINE WITHOUT CONTRAST  TECHNIQUE: Multidetector CT imaging of the head and cervical spine was performed following the standard protocol without intravenous contrast. Multiplanar CT image reconstructions of the cervical spine were also generated.  COMPARISON:  CT head Feb 06, 2014  FINDINGS: CT HEAD FINDINGS  The ventricles and sulci are normal. No intraparenchymal hemorrhage, mass effect nor midline shift. No acute large vascular territory infarcts.  No abnormal extra-axial fluid collections. Basal cisterns are patent.  No skull fracture. The included ocular globes and orbital contents are non-suspicious. The mastoid aircells and included paranasal sinuses are well-aerated.  CT CERVICAL SPINE FINDINGS  Cervical vertebral bodies and posterior elements are intact and aligned with straightened cervical lordosis. Intervertebral disc heights preserved. No destructive bony lesions. C1-2 articulation maintained. Included prevertebral and paraspinal soft tissues are nonacute; subcentimeter thyroid nodules, in no indicated follow-up.  IMPRESSION: CT HEAD: No acute intracranial process ; normal noncontrast CT head.  CT CERVICAL SPINE: Straightened cervical lordosis without acute cervical spine fracture nor malalignment.   Electronically Signed   By: Awilda Metro M.D.   On: 03/08/2015 23:11   Ct Cervical Spine Wo Contrast  03/08/2015   CLINICAL DATA:  Headache for 1 day. Fall to floor in emergency department, witnessed seizure, history of seizures. Off Depakote for 3 years.  EXAM: CT HEAD WITHOUT CONTRAST  CT CERVICAL SPINE WITHOUT CONTRAST  TECHNIQUE: Multidetector CT imaging of the head and cervical spine was performed following the standard protocol  without intravenous contrast. Multiplanar CT image reconstructions of the cervical spine were also generated.  COMPARISON:  CT head Feb 06, 2014  FINDINGS: CT HEAD FINDINGS  The ventricles and sulci are normal. No intraparenchymal hemorrhage, mass effect nor midline shift. No acute large vascular territory infarcts.  No abnormal extra-axial fluid collections. Basal cisterns are patent.  No skull fracture. The included ocular globes and orbital contents are non-suspicious. The mastoid aircells and included paranasal sinuses are well-aerated.  CT CERVICAL SPINE FINDINGS  Cervical vertebral bodies and posterior elements are intact and aligned with straightened cervical lordosis. Intervertebral disc heights preserved. No destructive bony lesions. C1-2 articulation maintained. Included prevertebral and paraspinal soft tissues are nonacute; subcentimeter thyroid nodules, in no indicated follow-up.  IMPRESSION: CT HEAD: No acute intracranial process ; normal noncontrast CT head.  CT CERVICAL SPINE: Straightened cervical lordosis without acute cervical spine fracture nor malalignment.   Electronically Signed   By: Awilda Metro M.D.   On: 03/08/2015 23:11     EKG Interpretation   Date/Time:  Thursday March 09 2015 00:13:08 EDT Ventricular Rate:  81 PR Interval:  131 QRS Duration: 82 QT Interval:  352 QTC Calculation: 408 R Axis:  76 Text Interpretation:  Sinus rhythm No significant change was found  Confirmed by [facility]  MD, TREY ([id number]) on 03/09/2015 12:43:07 AM      MDM   Final diagnoses:  Fall  Syncope, unspecified syncope type  Closed head injury, initial encounter    24 yo female who came to the ED due to a right sided headache.  While in the waiting room, she lost consciousness and fell, striking her head.  She was transferred to a stretcher and brought to an ED room.  In the room, she did not respond to verbal questioning, but did not appear to be unconscious.  She also demonstrated proper  control of her upper extremities during this time.  I did not witness any seizure like activity and I don't think her altered mental status was consistent with a post ictal state.  I suspect her unresponsiveness could have been psychogenic.  However, due to her fall and head injury, she required labs, UA, U preg, and CT head/c spine imaging.  All of her tests were reassuring.   I suspect her episode in the waiting room was syncope, likely orthostatic and/or vasovagal.  Her EKG was unremarkable and labs were normal.     On r e-eval, she was awake and would answer all questions.  She described her headache as right sided and typical of her usual headaches.  She had no fevers or meningismus.  She was able to stand and ambulate without difficulty.  HA treated with tylenol.    Advised close follow up with her PCP.  Return precautions given.     Blake Divine, MD 03/09/15 [phone]

## 2015-03-09 LAB — URINALYSIS, ROUTINE W REFLEX MICROSCOPIC
Bilirubin Urine: NEGATIVE
Glucose, UA: NEGATIVE mg/dL
Ketones, ur: NEGATIVE mg/dL
Leukocytes, UA: NEGATIVE
Nitrite: NEGATIVE
Protein, ur: NEGATIVE mg/dL
Specific Gravity, Urine: 1.026 (ref 1.005–1.030)
Urobilinogen, UA: 0.2 mg/dL (ref 0.0–1.0)
pH: 6.5 (ref 5.0–8.0)

## 2015-03-09 LAB — URINE MICROSCOPIC-ADD ON

## 2015-03-09 NOTE — ED Notes (Signed)
Pt ambulating independently w/ steady gait on d/c in no acute distress, A&Ox4. D/c instructions reviewed w/ pt - pt denies any further questions or concerns at present.  

## 2015-03-09 NOTE — Discharge Instructions (Signed)
Head Injury °You have received a head injury. It does not appear serious at this time. Headaches and vomiting are common following head injury. It should be easy to awaken from sleeping. Sometimes it is necessary for you to stay in the emergency department for a while for observation. Sometimes admission to the hospital may be needed. After injuries such as yours, most problems occur within the first 24 hours, but side effects may occur up to 7-10 days after the injury. It is important for you to carefully monitor your condition and contact your health care provider or seek immediate medical care if there is a change in your condition. °WHAT ARE THE TYPES OF HEAD INJURIES? °Head injuries can be as minor as a bump. Some head injuries can be more severe. More severe head injuries include: °· A jarring injury to the brain (concussion). °· A bruise of the brain (contusion). This mean there is bleeding in the brain that can cause swelling. °· A cracked skull (skull fracture). °· Bleeding in the brain that collects, clots, and forms a bump (hematoma). °WHAT CAUSES A HEAD INJURY? °A serious head injury is most likely to happen to someone who is in a car wreck and is not wearing a seat belt. Other causes of major head injuries include bicycle or motorcycle accidents, sports injuries, and falls. °HOW ARE HEAD INJURIES DIAGNOSED? °A complete history of the event leading to the injury and your current symptoms will be helpful in diagnosing head injuries. Many times, pictures of the brain, such as CT or MRI are needed to see the extent of the injury. Often, an overnight hospital stay is necessary for observation.  °WHEN SHOULD I SEEK IMMEDIATE MEDICAL CARE?  °You should get help right away if: °· You have confusion or drowsiness. °· You feel sick to your stomach (nauseous) or have continued, forceful vomiting. °· You have dizziness or unsteadiness that is getting worse. °· You have severe, continued headaches not relieved by  medicine. Only take over-the-counter or prescription medicines for pain, fever, or discomfort as directed by your health care provider. °· You do not have normal function of the arms or legs or are unable to walk. °· You notice changes in the black spots in the center of the colored part of your eye (pupil). °· You have a clear or bloody fluid coming from your nose or ears. °· You have a loss of vision. °During the next 24 hours after the injury, you must stay with someone who can watch you for the warning signs. This person should contact local emergency services (911 in the [place] if you have seizures, you become unconscious, or you are unable to wake up. °HOW CAN I PREVENT A HEAD INJURY IN THE FUTURE? °The most important factor for preventing major head injuries is avoiding motor vehicle accidents.  To minimize the potential for damage to your head, it is crucial to wear seat belts while riding in motor vehicles. Wearing helmets while bike riding and playing collision sports (like football) is also helpful. Also, avoiding dangerous activities around the house will further help reduce your risk of head injury.  °WHEN CAN I RETURN TO NORMAL ACTIVITIES AND ATHLETICS? °You should be reevaluated by your health care provider before returning to these activities. If you have any of the following symptoms, you should not return to activities or contact sports until 1 week after the symptoms have stopped: °· Persistent headache. °· Dizziness or vertigo. °· Poor attention and concentration. °· Confusion. °·   Memory problems. °· Nausea or vomiting. °· Fatigue or tire easily. °· Irritability. °· Intolerant of bright lights or loud noises. °· Anxiety or depression. °· Disturbed sleep. °MAKE SURE YOU:  °· Understand these instructions. °· Will watch your condition. °· Will get help right away if you are not doing well or get worse. °Document Released: 09/09/2005 Document Revised: 09/14/2013 Document Reviewed:  05/17/2013 °ExitCare® Patient Information ©2015 [employer]. This information is not intended to replace advice given to you by your health care provider. Make sure you discuss any questions you have with your health care provider. ° °Syncope °Syncope is a medical term for fainting or passing out. This means you lose consciousness and drop to the ground. People are generally unconscious for less than 5 minutes. You may have some muscle twitches for up to 15 seconds before waking up and returning to normal. Syncope occurs more often in older adults, but it can happen to anyone. While most causes of syncope are not dangerous, syncope can be a sign of a serious medical problem. It is important to seek medical care.  °CAUSES  °Syncope is caused by a sudden drop in blood flow to the brain. The specific cause is often not determined. Factors that can bring on syncope include: °· Taking medicines that lower blood pressure. °· Sudden changes in posture, such as standing up quickly. °· Taking more medicine than prescribed. °· Standing in one place for too long. °· Seizure disorders. °· Dehydration and excessive exposure to heat. °· Low blood sugar (hypoglycemia). °· Straining to have a bowel movement. °· Heart disease, irregular heartbeat, or other circulatory problems. °· Fear, emotional distress, seeing blood, or severe pain. °SYMPTOMS  °Right before fainting, you may: °· Feel dizzy or light-headed. °· Feel nauseous. °· See all white or all black in your field of vision. °· Have cold, clammy skin. °DIAGNOSIS  °Your health care provider will ask about your symptoms, perform a physical exam, and perform an electrocardiogram (ECG) to record the electrical activity of your heart. Your health care provider may also perform other heart or blood tests to determine the cause of your syncope which may include: °· Transthoracic echocardiogram (TTE). During echocardiography, sound waves are used to evaluate how blood flows through  your heart. °· Transesophageal echocardiogram (TEE). °· Cardiac monitoring. This allows your health care provider to monitor your heart rate and rhythm in real time. °· Holter monitor. This is a portable device that records your heartbeat and can help diagnose heart arrhythmias. It allows your health care provider to track your heart activity for several days, if needed. °· Stress tests by exercise or by giving medicine that makes the heart beat faster. °TREATMENT  °In most cases, no treatment is needed. Depending on the cause of your syncope, your health care provider may recommend changing or stopping some of your medicines. °HOME CARE INSTRUCTIONS °· Have someone stay with you until you feel stable. °· Do not drive, use machinery, or play sports until your health care provider says it is okay. °· Keep all follow-up appointments as directed by your health care provider. °· Lie down right away if you start feeling like you might faint. Breathe deeply and steadily. Wait until all the symptoms have passed. °· Drink enough fluids to keep your urine clear or pale yellow. °· If you are taking blood pressure or heart medicine, get up slowly and take several minutes to sit and then stand. This can reduce dizziness. °SEEK IMMEDIATE MEDICAL CARE   IF:  °· You have a severe headache. °· You have unusual pain in the chest, abdomen, or back. °· You are bleeding from your mouth or rectum, or you have black or tarry stool. °· You have an irregular or very fast heartbeat. °· You have pain with breathing. °· You have repeated fainting or seizure-like jerking during an episode. °· You faint when sitting or lying down. °· You have confusion. °· You have trouble walking. °· You have severe weakness. °· You have vision problems. °If you fainted, call your local emergency services (911 in [place] Do not drive yourself to the hospital.  °MAKE SURE YOU: °· Understand these instructions. °· Will watch your condition. °· Will get help right  away if you are not doing well or get worse. °Document Released: 09/09/2005 Document Revised: 09/14/2013 Document Reviewed: 11/08/2011 °ExitCare® Patient Information ©2015 [employer]. This information is not intended to replace advice given to you by your health care provider. Make sure you discuss any questions you have with your health care provider. ° °

## 2015-05-11 ENCOUNTER — Emergency Department (HOSPITAL_COMMUNITY)
Admission: EM | Admit: 2015-05-11 | Discharge: 2015-05-11 | Disposition: A | Discharge status: Home / Self Care | Payer: Medicaid Other | Attending: Emergency Medicine | Admitting: Emergency Medicine

## 2015-05-11 ENCOUNTER — Encounter (HOSPITAL_COMMUNITY): Payer: Self-pay | Admitting: Emergency Medicine

## 2015-05-11 DIAGNOSIS — N390 Urinary tract infection, site not specified: Secondary | ICD-10-CM | POA: Diagnosis not present

## 2015-05-11 DIAGNOSIS — Z87828 Personal history of other (healed) physical injury and trauma: Secondary | ICD-10-CM | POA: Diagnosis not present

## 2015-05-11 DIAGNOSIS — Z79818 Long term (current) use of other agents affecting estrogen receptors and estrogen levels: Secondary | ICD-10-CM | POA: Insufficient documentation

## 2015-05-11 DIAGNOSIS — R102 Pelvic and perineal pain: Secondary | ICD-10-CM | POA: Diagnosis present

## 2015-05-11 DIAGNOSIS — Z8739 Personal history of other diseases of the musculoskeletal system and connective tissue: Secondary | ICD-10-CM | POA: Insufficient documentation

## 2015-05-11 DIAGNOSIS — Z3202 Encounter for pregnancy test, result negative: Secondary | ICD-10-CM | POA: Insufficient documentation

## 2015-05-11 DIAGNOSIS — J45909 Unspecified asthma, uncomplicated: Secondary | ICD-10-CM | POA: Diagnosis not present

## 2015-05-11 LAB — I-STAT CHEM 8, ED
BUN: 16 mg/dL (ref 6–20)
Calcium, Ion: 1.21 mmol/L (ref 1.12–1.23)
Chloride: 103 mmol/L (ref 101–111)
Creatinine, Ser: 0.8 mg/dL (ref 0.44–1.00)
Glucose, Bld: 84 mg/dL (ref 65–99)
HCT: 42 % (ref 36.0–46.0)
Hemoglobin: 14.3 g/dL (ref 12.0–15.0)
Potassium: 3.5 mmol/L (ref 3.5–5.1)
Sodium: 139 mmol/L (ref 135–145)
TCO2: 25 mmol/L (ref 0–100)

## 2015-05-11 LAB — URINALYSIS, ROUTINE W REFLEX MICROSCOPIC
Bilirubin Urine: NEGATIVE
Glucose, UA: NEGATIVE mg/dL
Ketones, ur: NEGATIVE mg/dL
Nitrite: NEGATIVE
Protein, ur: 100 mg/dL — AB
Specific Gravity, Urine: 1.026 (ref 1.005–1.030)
Urobilinogen, UA: 1 mg/dL (ref 0.0–1.0)
pH: 6 (ref 5.0–8.0)

## 2015-05-11 LAB — CBC
HCT: 39.3 % (ref 36.0–46.0)
Hemoglobin: 13.6 g/dL (ref 12.0–15.0)
MCH: 31.7 pg (ref 26.0–34.0)
MCHC: 34.6 g/dL (ref 30.0–36.0)
MCV: 91.6 fL (ref 78.0–100.0)
Platelets: 196 10*3/uL (ref 150–400)
RBC: 4.29 MIL/uL (ref 3.87–5.11)
RDW: 12.3 % (ref 11.5–15.5)
WBC: 12.5 10*3/uL — ABNORMAL HIGH (ref 4.0–10.5)

## 2015-05-11 LAB — WET PREP, GENITAL
Trich, Wet Prep: NONE SEEN
Yeast Wet Prep HPF POC: NONE SEEN

## 2015-05-11 LAB — URINE MICROSCOPIC-ADD ON

## 2015-05-11 LAB — I-STAT BETA HCG BLOOD, ED (MC, WL, AP ONLY): I-stat hCG, quantitative: <5 m[IU]/mL (ref <5)

## 2015-05-11 MED ORDER — PHENAZOPYRIDINE HCL 100 MG PO TABS
100.0000 mg | ORAL_TABLET | Freq: Three times a day (TID) | ORAL | Status: DC
  Administered 2015-05-11: 100 mg via ORAL
  Filled 2015-05-11: qty 1

## 2015-05-11 MED ORDER — PHENAZOPYRIDINE HCL 100 MG PO TABS: 100.0000 mg | ORAL_TABLET | Freq: Three times a day (TID) | ORAL | Status: DC

## 2015-05-11 MED ORDER — SULFAMETHOXAZOLE-TRIMETHOPRIM 800-160 MG PO TABS: 1.0000 | ORAL_TABLET | Freq: Two times a day (BID) | ORAL | Status: DC

## 2015-05-11 MED ORDER — SULFAMETHOXAZOLE-TRIMETHOPRIM 800-160 MG PO TABS
1.0000 | ORAL_TABLET | Freq: Once | ORAL | Status: AC
  Administered 2015-05-11: 1 via ORAL
  Filled 2015-05-11: qty 1

## 2015-05-11 NOTE — ED Notes (Signed)
Pt reports right lower groin pain/pelvic pain and pressure described as "I feel like I need to push." Also endorses some bleeding when she wipes after urination. On BC pills, unsure if she could be pregnant. Denies N/V/D/Fevers. Hx cysts. No other c/c.

## 2015-05-11 NOTE — ED Provider Notes (Signed)
CSN: [id number]     Arrival date & time 05/11/15  0058 History   First MD Initiated Contact with Patient 05/11/15 0253     Chief Complaint  Patient presents with  . Groin Pain  . Pelvic Pain  . Vaginal Pain  . Hematuria     (Consider location/radiation/quality/duration/timing/severity/associated sxs/prior Treatment) HPI Comments: Patient states for the past 2 days after she urinates she feels a pressure making her want to push causing some lower abdominal discomfort which lasts momentarily.  Denies any nausea, vomiting, diarrhea, fever, myalgias, headache, vaginal discharge.  States she's been taking birth control pills for the past 3 months, last menstrual cycle was 3 weeks ago.  She is about to start her placebo pills  Patient is a 24 y.o. female presenting with groin pain, pelvic pain, vaginal pain, and hematuria. The history is provided by the patient.  Groin Pain This is a new problem. The current episode started yesterday. The problem occurs intermittently. The problem has been gradually worsening. Associated symptoms include abdominal pain and urinary symptoms. Pertinent negatives include no fever, nausea, rash or vomiting. Exacerbated by: Urinating. She has tried nothing for the symptoms. The treatment provided no relief.  Pelvic Pain Associated symptoms include abdominal pain and urinary symptoms. Pertinent negatives include no fever, nausea, rash or vomiting.  Vaginal Pain Associated symptoms include abdominal pain and urinary symptoms. Pertinent negatives include no fever, nausea, rash or vomiting.  Hematuria Associated symptoms include abdominal pain and urinary symptoms. Pertinent negatives include no fever, nausea, rash or vomiting.    Past Medical History  Diagnosis Date  . Seizures   . Closed head injury   . Scoliosis   . Asthma    Past Surgical History  Procedure Laterality Date  . Back surgery      screws placed for scoliosis   Family History  Problem Relation  Age of Onset  . Anesthesia problems Neg Hx   . Hypertension Father   . Diabetes Maternal Grandmother   . Hypertension Maternal Grandmother   . Diabetes Paternal Grandmother   . Hypertension Paternal Grandmother    Social History  Substance Use Topics  . Smoking status: Never Smoker   . Smokeless tobacco: Never Used  . Alcohol Use: No   OB History    Gravida Para Term Preterm AB TAB SAB Ectopic Multiple Living   Review of Systems  Constitutional: Negative for fever.  Respiratory: Negative for shortness of breath.   Gastrointestinal: Positive for abdominal pain. Negative for nausea, vomiting, diarrhea and constipation.  Genitourinary: Positive for dysuria, hematuria, vaginal pain and pelvic pain. Negative for frequency and difficulty urinating.  Skin: Negative for rash.  All other systems reviewed and are negative.     Allergies  Review of patient's allergies indicates no known allergies.  Home Medications   Prior to Admission medications   Medication Sig Start Date End Date Taking? Authorizing Provider  norethindrone-ethinyl estradiol 1/35 (ORTHO-NOVUM, NORTREL,CYCLAFEM) tablet Take 1 tablet by mouth daily.   Yes Historical Provider, MD  ibuprofen (ADVIL,MOTRIN) 800 MG tablet Take 1 tablet (800 mg total) by mouth every 8 (eight) hours as needed. Patient not taking: Reported on 03/08/2015 11/01/14   Earley Favor, NP  phenazopyridine (PYRIDIUM) 100 MG tablet Take 1 tablet (100 mg total) by mouth 3 (three) times daily with meals. 05/11/15   Earley Favor, NP  sulfamethoxazole-trimethoprim (BACTRIM DS,SEPTRA DS) 800-160 MG per tablet Take 1  tablet by mouth 2 (two) times daily. 05/11/15   Earley Favor, NP   BP 114/72 mmHg  Pulse 85  Temp(Src) 97.5 F (36.4 C) (Oral)  Resp 15  SpO2 100% Physical Exam  Constitutional: She appears well-developed and well-nourished.  HENT:  Head: Normocephalic.  Eyes: Pupils are equal, round, and reactive to light.  Neck: Normal  range of motion.  Cardiovascular: Normal rate and regular rhythm.   Pulmonary/Chest: Effort normal and breath sounds normal.  Abdominal: Soft. Bowel sounds are normal. She exhibits no distension. There is no tenderness.  Genitourinary: Uterus normal. Uterus is not enlarged and not tender. Cervix exhibits no discharge. Right adnexum displays no tenderness and no fullness. Left adnexum displays no tenderness and no fullness. Vaginal discharge found.  Discomfort at the entrodus  Skin: Skin is warm and dry.  Nursing note and vitals reviewed.   ED Course  Procedures (including critical care time) Labs Review Labs Reviewed  WET PREP, GENITAL - Abnormal; Notable for the following:    Clue Cells Wet Prep HPF POC FEW (*)    WBC, Wet Prep HPF POC MODERATE (*)    All other components within normal limits  CBC - Abnormal; Notable for the following:    WBC 12.5 (*)    All other components within normal limits  URINALYSIS, ROUTINE W REFLEX MICROSCOPIC (NOT AT [facility]) - Abnormal; Notable for the following:    APPearance CLOUDY (*)    Hgb urine dipstick LARGE (*)    Protein, ur 100 (*)    Leukocytes, UA MODERATE (*)    All other components within normal limits  URINE MICROSCOPIC-ADD ON - Abnormal; Notable for the following:    Squamous Epithelial / LPF FEW (*)    Bacteria, UA MANY (*)    All other components within normal limits  I-STAT BETA HCG BLOOD, ED ([facility], [facility], [facility] ONLY)  I-STAT CHEM 8, ED  GC/CHLAMYDIA PROBE AMP ([place]) NOT AT [facility]    Imaging Review No results found. I have personally reviewed and evaluated these images and lab results as part of my medical decision-making.   EKG Interpretation None     It should be.  We treated with Pyridium and Septra.  She's been warned use of additional barrier form of birth control until her next menstrual cycle due to the atelectatic use MDM   Final diagnoses:  UTI (lower urinary tract infection)         Earley Favor, NP 05/11/15  0424  Earley Favor, NP 05/11/15 6213  Loren Racer, MD 05/11/15 [phone]

## 2015-05-11 NOTE — ED Notes (Signed)
Bed: WA07 Expected date:  Expected time:  Means of arrival:  Comments: 

## 2015-05-11 NOTE — Discharge Instructions (Signed)
Antibiotic Medication Antibiotic medicine helps fight germs. Germs cause infections. This type of medicine will not work for colds, flu, or other viral infections. Tell your doctor if you:  Are allergic to any medicines.  Are pregnant or are trying to get pregnant.  Are taking other medicines.  Have other medical problems. HOME CARE  Take your medicine with a glass of water or food as told by your doctor.  Take the medicine as told. Finish them even if you start to feel better.  Do not give your medicine to other people.  Do not use your medicine in the future for a different infection.  Ask your doctor about which side effects to watch for.  Try not to miss any doses. If you miss a dose, take it as soon as possible. If it is almost time for your next dose, and your dosing schedule is:  Two doses a day, take the missed dose and the next dose 5 to 6 hours later.  Three or more doses a day, take the missed dose and the next dose 2 to 4 hours later, or double your next dose.  Then go back to your normal schedule. GET HELP RIGHT AWAY IF:   You get worse or do not get better within a few days.  The medicine makes you sick.  You develop a rash or any other side effects.  You have questions or concerns. MAKE SURE YOU:  Understand these instructions.  Will watch your condition.  Will get help right away if you are not doing well or get worse. Document Released: 06/18/2008 Document Revised: 12/02/2011 Document Reviewed: 08/15/2009 [facility] Patient Information 2015 [place], [place]. This information is not intended to replace advice given to you by your health care provider. Make sure you discuss any questions you have with your health care provider. You're being treated for urinary tract infection with Marchelle Folks biotics.  Due to this fact, you should use a barrier form of birth control until your next menstrual cycle, presented biotics decrease the efficacy of your control pills

## 2015-05-11 NOTE — ED Notes (Signed)
Pt unsure of LMP. She reports right after she urinated she felt as if she need to push "like I was giving birth". She c/o right sided lower abdominal pain that is not tender upon palpation.

## 2015-05-11 NOTE — ED Notes (Signed)
Pt ambulated to restroom with steady gait.

## 2015-05-11 NOTE — ED Notes (Signed)
Pelvic cart at bedside. 

## 2015-05-12 LAB — GC/CHLAMYDIA PROBE AMP (~~LOC~~) NOT AT ARMC
Chlamydia: POSITIVE — AB
Neisseria Gonorrhea: NEGATIVE

## 2015-05-15 ENCOUNTER — Telehealth (HOSPITAL_COMMUNITY): Payer: Self-pay

## 2015-05-15 NOTE — Telephone Encounter (Signed)
Positive for chlamydia. Chart sent to EDP office for review.  

## 2015-05-17 ENCOUNTER — Telehealth (HOSPITAL_BASED_OUTPATIENT_CLINIC_OR_DEPARTMENT_OTHER): Payer: Self-pay | Admitting: Emergency Medicine

## 2015-05-18 ENCOUNTER — Emergency Department (HOSPITAL_COMMUNITY): Payer: Medicaid Other

## 2015-05-18 ENCOUNTER — Inpatient Hospital Stay (HOSPITAL_COMMUNITY)
Admission: EM | Admit: 2015-05-18 | Discharge: 2015-05-19 | DRG: 917 | Disposition: A | Discharge status: Home / Self Care | Payer: Medicaid Other | Attending: Internal Medicine | Admitting: Internal Medicine

## 2015-05-18 DIAGNOSIS — M419 Scoliosis, unspecified: Secondary | ICD-10-CM | POA: Diagnosis present

## 2015-05-18 DIAGNOSIS — J4520 Mild intermittent asthma, uncomplicated: Secondary | ICD-10-CM

## 2015-05-18 DIAGNOSIS — E872 Acidosis, unspecified: Secondary | ICD-10-CM | POA: Diagnosis present

## 2015-05-18 DIAGNOSIS — G43909 Migraine, unspecified, not intractable, without status migrainosus: Secondary | ICD-10-CM | POA: Diagnosis present

## 2015-05-18 DIAGNOSIS — G40901 Epilepsy, unspecified, not intractable, with status epilepticus: Secondary | ICD-10-CM | POA: Diagnosis present

## 2015-05-18 DIAGNOSIS — G9340 Encephalopathy, unspecified: Secondary | ICD-10-CM | POA: Diagnosis present

## 2015-05-18 DIAGNOSIS — R55 Syncope and collapse: Secondary | ICD-10-CM | POA: Diagnosis present

## 2015-05-18 DIAGNOSIS — F1210 Cannabis abuse, uncomplicated: Secondary | ICD-10-CM | POA: Diagnosis present

## 2015-05-18 DIAGNOSIS — G92 Toxic encephalopathy: Secondary | ICD-10-CM | POA: Diagnosis present

## 2015-05-18 DIAGNOSIS — F1410 Cocaine abuse, uncomplicated: Secondary | ICD-10-CM | POA: Diagnosis present

## 2015-05-18 DIAGNOSIS — S0990XA Unspecified injury of head, initial encounter: Secondary | ICD-10-CM

## 2015-05-18 DIAGNOSIS — J45909 Unspecified asthma, uncomplicated: Secondary | ICD-10-CM | POA: Diagnosis present

## 2015-05-18 DIAGNOSIS — R569 Unspecified convulsions: Secondary | ICD-10-CM | POA: Diagnosis not present

## 2015-05-18 DIAGNOSIS — Z87898 Personal history of other specified conditions: Secondary | ICD-10-CM | POA: Diagnosis present

## 2015-05-18 DIAGNOSIS — T407X1A Poisoning by cannabis (derivatives), accidental (unintentional), initial encounter: Secondary | ICD-10-CM | POA: Diagnosis present

## 2015-05-18 DIAGNOSIS — T405X1A Poisoning by cocaine, accidental (unintentional), initial encounter: Principal | ICD-10-CM | POA: Diagnosis present

## 2015-05-18 HISTORY — DX: Migraine, unspecified, not intractable, without status migrainosus: G43.909

## 2015-05-18 LAB — CBC WITH DIFFERENTIAL/PLATELET
Basophils Absolute: 0.1 10*3/uL (ref 0.0–0.1)
Basophils Relative: 1 % (ref 0–1)
Eosinophils Absolute: 0.2 10*3/uL (ref 0.0–0.7)
Eosinophils Relative: 2 % (ref 0–5)
HCT: 37.5 % (ref 36.0–46.0)
Hemoglobin: 12.8 g/dL (ref 12.0–15.0)
Lymphocytes Relative: 34 % (ref 12–46)
Lymphs Abs: 2.9 10*3/uL (ref 0.7–4.0)
MCH: 31.4 pg (ref 26.0–34.0)
MCHC: 34.1 g/dL (ref 30.0–36.0)
MCV: 92.1 fL (ref 78.0–100.0)
Monocytes Absolute: 0.4 10*3/uL (ref 0.1–1.0)
Monocytes Relative: 5 % (ref 3–12)
Neutro Abs: 4.9 10*3/uL (ref 1.7–7.7)
Neutrophils Relative %: 58 % (ref 43–77)
Platelets: 213 10*3/uL (ref 150–400)
RBC: 4.07 MIL/uL (ref 3.87–5.11)
RDW: 12.8 % (ref 11.5–15.5)
WBC: 8.4 10*3/uL (ref 4.0–10.5)

## 2015-05-18 LAB — COMPREHENSIVE METABOLIC PANEL
ALT: 13 U/L — ABNORMAL LOW (ref 14–54)
AST: 21 U/L (ref 15–41)
Albumin: 3.6 g/dL (ref 3.5–5.0)
Alkaline Phosphatase: 57 U/L (ref 38–126)
Anion gap: 16 — ABNORMAL HIGH (ref 5–15)
BUN: 10 mg/dL (ref 6–20)
CO2: 15 mmol/L — ABNORMAL LOW (ref 22–32)
Calcium: 8.8 mg/dL — ABNORMAL LOW (ref 8.9–10.3)
Chloride: 112 mmol/L — ABNORMAL HIGH (ref 101–111)
Creatinine, Ser: 0.64 mg/dL (ref 0.44–1.00)
GFR calc Af Amer: >60 mL/min (ref >60)
GFR calc non Af Amer: >60 mL/min (ref >60)
Glucose, Bld: 86 mg/dL (ref 65–99)
Potassium: 3.5 mmol/L (ref 3.5–5.1)
Sodium: 143 mmol/L (ref 135–145)
Total Bilirubin: 0.3 mg/dL (ref 0.3–1.2)
Total Protein: 6.8 g/dL (ref 6.5–8.1)

## 2015-05-18 LAB — CBG MONITORING, ED: Glucose-Capillary: 93 mg/dL (ref 65–99)

## 2015-05-18 MED ORDER — SODIUM CHLORIDE 0.9 % IV SOLN
1000.0000 mg | Freq: Once | INTRAVENOUS | Status: AC
  Administered 2015-05-18: 1000 mg via INTRAVENOUS
  Filled 2015-05-18: qty 10

## 2015-05-18 NOTE — ED Notes (Signed)
Dr. Glick at bedside.  

## 2015-05-18 NOTE — ED Notes (Signed)
Ammonia capsule used to awaken patient, minimal response

## 2015-05-18 NOTE — ED Notes (Signed)
EDP at bedside  

## 2015-05-18 NOTE — ED Notes (Addendum)
Pt had another episode of shaking and posturing lasting approx. 10 secs. Pt remains nonverbal. Pupils reactive; Pt was tachycardic, oxygen sats 96%

## 2015-05-18 NOTE — H&P (Signed)
[facility] History and Physical  Erin Price RUE:[id number] DOB: 1990/11/20 DOA: 05/18/2015  Referring physician: ED physician PCP: No PCP Per Patient  Specialists:   Chief Complaint: LOC  HPI: Erin Price is a 24 y.o. female with PMH of  Seizure not on medications, closed head injury, scoliosis, asthma, migraine headache, who presents with LOC.  Patient has LOC and is unable to provide history, therefore, most of the history is obtained by discussing the case with the ED physician, per EMS and her sister. Per her sister, pt was found on the street with LOC by police at about 10:00 PM. EMS notes pt hit her head on the concrete. Had 3 seizures en route to ED. Given  versed and  narcan by EMS. Did not return to baseline. She had 2 more episodes of generalized shaking in the ED. When I saw pt in ED, pt remained nonverbal, unresponsive. Of note, her significant other just broke up with her tonight. Currently getting loaded with keppra  in the ED.   In ED, patient was found to have negative CT head, WBC 8.4,  Which are normal, no tachycardia, bicarbonate 15 , AG=16.  Patient is admitted to inpatient for further evaluation and treatment. Neurology was consulted.  Where does patient live?   At home    Can patient participate in ADLs?  Yes    Review of Systems:  Cannot be obtained due to LOC and nonverbal. Allergy: No Known Allergies  Past Medical History  Diagnosis Date  . Seizures   . Closed head injury   . Scoliosis   . Asthma   . Migraine     Past Surgical History  Procedure Laterality Date  . Back surgery      screws placed for scoliosis    Social History:  reports that she has never smoked. She has never used smokeless tobacco. She reports that she does not drink alcohol or use illicit drugs.  Family History:  Family History  Problem Relation Age of Onset  . Anesthesia problems Neg Hx   . Hypertension Father   . Diabetes Maternal Grandmother   .  Hypertension Maternal Grandmother   . Diabetes Paternal Grandmother   . Hypertension Paternal Grandmother      Prior to Admission medications   Medication Sig Start Date End Date Taking? Authorizing Provider  ibuprofen (ADVIL,MOTRIN) 800 MG tablet Take 1 tablet (800 mg total) by mouth every 8 (eight) hours as needed. Patient not taking: Reported on 03/08/2015 11/01/14   Earley Favor, NP  norethindrone-ethinyl estradiol 1/35 (ORTHO-NOVUM, NORTREL,CYCLAFEM) tablet Take 1 tablet by mouth daily.    Historical Provider, MD  phenazopyridine (PYRIDIUM) 100 MG tablet Take 1 tablet (100 mg total) by mouth 3 (three) times daily with meals. 05/11/15   Earley Favor, NP  sulfamethoxazole-trimethoprim (BACTRIM DS,SEPTRA DS) 800-160 MG per tablet Take 1 tablet by mouth 2 (two) times daily. 05/11/15   Earley Favor, NP    Physical Exam: Filed Vitals:   05/18/15 2345 05/19/15 0000 05/19/15 0030 05/19/15 0100  BP: 113/61 110/64 110/63 108/62  Pulse: 103 100 95 92  Temp:      TempSrc:      Resp: SpO2: 99% 99% 100% 98%   General: Not in acute distress HEENT:       Eyes: PERRL, no scleral icterus.       ENT: No discharge from the ears and nose, no pharynx injection, no tonsillar enlargement.  Neck: No JVD, no bruit, no mass felt. Heme: No neck lymph node enlargement. Cardiac: S1/S2, RRR, No murmurs, No gallops or rubs. Pulm:  No rales, wheezing, rhonchi or rubs. Abd: Soft, nondistended, nontender, no rebound pain, no organomegaly, BS present. Ext: No pitting leg edema bilaterally. 2+DP/PT pulse bilaterally. Musculoskeletal: No joint deformities, No joint redness or warmth, no limitation of ROM in spin. Skin: No rashes.  Neuro: Not oriented X3, cranial nerves II-XII grossly intact, Brachial reflex 1+ bilaterally. Knee reflex 1+ bilaterally. Negative Babinski's sign. Not moves extremities. Psych: Patient is not psychotic, no suicidal or hemocidal ideation.  Labs on Admission:  Basic  Metabolic Panel:  Recent Labs Lab 05/18/15 2230  NA 143  K 3.5  CL 112*  CO2 15*  GLUCOSE 86  BUN 10  CREATININE 0.64  CALCIUM 8.8*   Liver Function Tests:  Recent Labs Lab 05/18/15 2230  AST 21  ALT 13*  ALKPHOS 57  BILITOT 0.3  PROT 6.8  ALBUMIN 3.6   No results for input(s): LIPASE, AMYLASE in the last 168 hours. No results for input(s): AMMONIA in the last 168 hours. CBC:  Recent Labs Lab 05/18/15 2230  WBC 8.4  NEUTROABS 4.9  HGB 12.8  HCT 37.5  MCV 92.1  PLT 213   Cardiac Enzymes: No results for input(s): CKTOTAL, CKMB, CKMBINDEX, TROPONINI in the last 168 hours.  BNP (last 3 results) No results for input(s): BNP in the last 8760 hours.  ProBNP (last 3 results) No results for input(s): PROBNP in the last 8760 hours.  CBG:  Recent Labs Lab 05/18/15 2230  GLUCAP 93    Radiological Exams on Admission: Ct Head Wo Contrast  05/18/2015   CLINICAL DATA:  Seizure.  Unresponsive.  EXAM: CT HEAD WITHOUT CONTRAST  TECHNIQUE: Contiguous axial images were obtained from the base of the skull through the vertex without intravenous contrast.  COMPARISON:  03/08/2015  FINDINGS: No acute cortical infarct, hemorrhage, or mass lesion ispresent. Ventricles are of normal size. No significant extra-axial fluid collection is present. The paranasal sinuses andmastoid air cells are clear. The osseous skull is intact.  IMPRESSION: Normal brain.   Electronically Signed   By: Signa Kell M.D.   On: 05/18/2015 23:33    EKG: Not done in ED, will get one.   Assessment/Plan Principal Problem:   Acute encephalopathy Active Problems:   Scoliosis   Asthma   Migraine   Metabolic acidosis  Acute encephalopathy:  Etiology is not clear. Neurology was consulted, Dr. Hosie Poisson evaluated the patient. Per Dr. Hosie Poisson, DD includes seizure with current NCSE vs possible non-epileptic seizures.   -will admit to tele bed  - appreciate Dr. Minus Breeding consultation, will follow up  recommendations as follows   -stat EEG  -UDS, ethanol level  -keppra 500mg  BID  -MRI brain with and without contrast  -seizure precautions  -further workup and plan pending EEG results  Asthma: stable. Not on medications. Lung is clear to auscultation. -observe respiratory function closely.  Migraine: - continue home when necessary ibuprofen  Metabolic acidosis: Bicarbonate 15, AG=16. Likely due to elevated lactic acid 2/2 possible seizure. -check lactate level  DVT ppx: SQ Heparin   Code Status: Full code Family Communication:  Yes, patient's mother and sister at bed side Disposition Plan: Admit to inpatient   Date of Service 05/19/2015    Lorretta Harp [facility] Pager [phone]  If 7PM-7AM, please contact night-coverage [email or web address] Password [email or web address] 05/19/2015, 1:59 AM

## 2015-05-18 NOTE — ED Provider Notes (Signed)
CSN: [id number]     Arrival date & time 05/18/15  2219 History  This chart was scribed for Dione Booze, MD by Tanda Rockers, ED Scribe. This patient was seen in room [facility] and the patient's care was started at 11:26 PM.    Chief Complaint  Patient presents with  . Seizures  . Loss of Consciousness   LEVEL 5 CAVEAT for unresponsive  The history is provided by a parent and a friend. The history is limited by a language barrier. No language interpreter was used.    HPI Comments:  HPI given by friend and mother of pt   Erin Price is a 24 y.o. female brought in by ambulance, with hx seizures who presents to the Emergency Department complaining of sudden LOC and seizure that occurred earlier tonight. Per triage report, pt was found walking down the side of the road by the fire department. She told fireman that her significant other had just broken up with her when she had a syncopal episode and hit her head on the concrete. Pt then had a witnessed seizure prior to EMS arrival. Pt had 3 more seizures en route. She was given 5 mg Versed, 2 mg Narcan, and 250 cc of NS. Pt was never alert with EMS and continues to not be alert in the ED. Friend reports that she found pt in the street tonight but cannot say what time. Pt has not been verbal to friend or family since being in the ED. Mother states that pt has passed out in the past but cannot say why.   Past Medical History  Diagnosis Date  . Seizures   . Closed head injury   . Scoliosis   . Asthma    Past Surgical History  Procedure Laterality Date  . Back surgery      screws placed for scoliosis   Family History  Problem Relation Age of Onset  . Anesthesia problems Neg Hx   . Hypertension Father   . Diabetes Maternal Grandmother   . Hypertension Maternal Grandmother   . Diabetes Paternal Grandmother   . Hypertension Paternal Grandmother    Social History  Substance Use Topics  . Smoking status: Never Smoker   . Smokeless  tobacco: Never Used  . Alcohol Use: No   OB History    Gravida Para Term Preterm AB TAB SAB Ectopic Multiple Living   2 2 1 1      2      Review of Systems  Unable to perform ROS: Patient nonverbal   Allergies  Review of patient's allergies indicates no known allergies.  Home Medications   Prior to Admission medications   Medication Sig Start Date End Date Taking? Authorizing Provider  ibuprofen (ADVIL,MOTRIN) 800 MG tablet Take 1 tablet (800 mg total) by mouth every 8 (eight) hours as needed. Patient not taking: Reported on 03/08/2015 11/01/14   Earley Favor, NP  norethindrone-ethinyl estradiol 1/35 (ORTHO-NOVUM, NORTREL,CYCLAFEM) tablet Take 1 tablet by mouth daily.    Historical Provider, MD  phenazopyridine (PYRIDIUM) 100 MG tablet Take 1 tablet (100 mg total) by mouth 3 (three) times daily with meals. 05/11/15   Earley Favor, NP  sulfamethoxazole-trimethoprim (BACTRIM DS,SEPTRA DS) 800-160 MG per tablet Take 1 tablet by mouth 2 (two) times daily. 05/11/15   Earley Favor, NP   Triage Vitals: BP 94/49 mmHg  Pulse 100  Temp(Src) 99.1 F (37.3 C) (Axillary)  Resp 23  SpO2 95%   Physical Exam  Constitutional: She appears well-developed and  well-nourished. No distress.  HENT:  Head: Normocephalic and atraumatic.  Gag reflex intact  Eyes: Conjunctivae are normal. Pupils are equal, round, and reactive to light.  Fundi normal Bells phenomena present  Neck: Normal range of motion. Neck supple. No JVD present.  Cardiovascular: Normal rate, regular rhythm and normal heart sounds.   No murmur heard. Pulmonary/Chest: Effort normal and breath sounds normal. She has no wheezes. She has no rales. She exhibits no tenderness.  Abdominal: Soft. Bowel sounds are normal. She exhibits no distension and no mass.  Musculoskeletal: Normal range of motion. She exhibits no edema.  Lymphadenopathy:    She has no cervical adenopathy.  Neurological:  Unrepsponsive to deep pain All muscles flaccid Does  not protect her face when hand is dropped  Skin: Skin is warm and dry.  Nursing note and vitals reviewed.   ED Course  Procedures (including critical care time)  DIAGNOSTIC STUDIES: Oxygen Saturation is 95% on RA, normal by my interpretation.    COORDINATION OF CARE: 11:33 PM-Discussed treatment plan which includes ETOH, urine pregnancy, CMP, Rapid drug screen with family at bedside and family agreed to plan.   Labs Review Results for orders placed or performed during the hospital encounter of 05/18/15  CBC with Differential  Result Value Ref Range   WBC 8.4 4.0 - 10.5 K/uL   RBC 4.07 3.87 - 5.11 MIL/uL   Hemoglobin 12.8 12.0 - 15.0 g/dL   HCT 16.1 09.6 - 04.5 %   MCV 92.1 78.0 - 100.0 fL   MCH 31.4 26.0 - 34.0 pg   MCHC 34.1 30.0 - 36.0 g/dL   RDW 40.9 81.1 - 91.4 %   Platelets 213 150 - 400 K/uL   Neutrophils Relative % 58 43 - 77 %   Neutro Abs 4.9 1.7 - 7.7 K/uL   Lymphocytes Relative 34 12 - 46 %   Lymphs Abs 2.9 0.7 - 4.0 K/uL   Monocytes Relative 5 3 - 12 %   Monocytes Absolute 0.4 0.1 - 1.0 K/uL   Eosinophils Relative 2 0 - 5 %   Eosinophils Absolute 0.2 0.0 - 0.7 K/uL   Basophils Relative 1 0 - 1 %   Basophils Absolute 0.1 0.0 - 0.1 K/uL  Comprehensive metabolic panel  Result Value Ref Range   Sodium 143 135 - 145 mmol/L   Potassium 3.5 3.5 - 5.1 mmol/L   Chloride 112 (H) 101 - 111 mmol/L   CO2 15 (L) 22 - 32 mmol/L   Glucose, Bld 86 65 - 99 mg/dL   BUN 10 6 - 20 mg/dL   Creatinine, Ser 7.82 0.44 - 1.00 mg/dL   Calcium 8.8 (L) 8.9 - 10.3 mg/dL   Total Protein 6.8 6.5 - 8.1 g/dL   Albumin 3.6 3.5 - 5.0 g/dL   AST 21 15 - 41 U/L   ALT 13 (L) 14 - 54 U/L   Alkaline Phosphatase 57 38 - 126 U/L   Total Bilirubin 0.3 0.3 - 1.2 mg/dL   GFR calc non Af Amer >60 >60 mL/min   GFR calc Af Amer >60 >60 mL/min   Anion gap 16 (H) 5 - 15  CBG monitoring, ED  Result Value Ref Range   Glucose-Capillary 93 65 - 99 mg/dL   Imaging Review Ct Head Wo  Contrast  05/18/2015   CLINICAL DATA:  Seizure.  Unresponsive.  EXAM: CT HEAD WITHOUT CONTRAST  TECHNIQUE: Contiguous axial images were obtained from the base of the skull through the vertex  without intravenous contrast.  COMPARISON:  03/08/2015  FINDINGS: No acute cortical infarct, hemorrhage, or mass lesion ispresent. Ventricles are of normal size. No significant extra-axial fluid collection is present. The paranasal sinuses andmastoid air cells are clear. The osseous skull is intact.  IMPRESSION: Normal brain.   Electronically Signed   By: Signa Kell M.D.   On: 05/18/2015 23:33   I have personally reviewed and evaluated these images and lab results as part of my medical decision-making.  MDM  Patient presented after being found unresponsive on the street. She had 3 witnessed seizures and front of EMS who gave her midazolam and. She is now poorly responsive. This may represent postictal state, may represent ongoing sedation from midazolam. She is noted to have a history of seizures but is not on any anticonvulsives.  While present patient had a generalized tonic clonic seizure. Given loading dose of Keppra. CO2 is noted to be low on her metabolic panel consistent with transient metabolic acidosis from seizure. Case is discussed with Dr. Hosie Poisson of neurology who agrees to see the patient in consultation, and also with Dr. Clyde [facility]borg of [facility] who agrees to admit the patient. Because of status epilepticus, she will be admitted to stepdown unit.  CRITICAL CARE Performed by: Dione Booze Total critical care time: 35 minutes Critical care time was exclusive of separately billable procedures and treating other patients. Critical care was necessary to treat or prevent imminent or life-threatening deterioration. Critical care was time spent personally by me on the following activities: development of treatment plan with patient and/or surrogate as well as nursing, discussions with consultants, evaluation  of patient's response to treatment, examination of patient, obtaining history from patient or surrogate, ordering and performing treatments and interventions, ordering and review of laboratory studies, ordering and review of radiographic studies, pulse oximetry and re-evaluation of patient's condition.' Final diagnoses:  Status epilepticus    I personally performed the services described in this documentation, which was scribed in my presence. The recorded information has been reviewed and is accurate.       Dione Booze, MD 05/19/15 [phone]

## 2015-05-18 NOTE — ED Notes (Addendum)
EMS-patient was found walking down the side of the road by the fire department. Patient stated that she didn't care about anything and that her significant other had broke up with her. While talking with fire patient suddenly stopped and had syncopal episode, hitting head on the concrete. Patient then had witnessed seizure with fire. En route patient had 3 more seizures and  versed and  of narcan. Patient was never alert with EMS. 18(R)AC. Patient received 250cc of NS en route. Patient placed on LSB and ccollar for transport.

## 2015-05-18 NOTE — ED Notes (Addendum)
Patient is not responding to staff at this time but is maintaining airway. Patient with fluttering eye movements noted. No trauma on exam when patient log rolled. No trauma noted to back or abdomen.

## 2015-05-19 ENCOUNTER — Other Ambulatory Visit: Payer: Self-pay

## 2015-05-19 ENCOUNTER — Inpatient Hospital Stay (HOSPITAL_COMMUNITY): Payer: Medicaid Other

## 2015-05-19 ENCOUNTER — Encounter (HOSPITAL_COMMUNITY): Payer: Self-pay | Admitting: Internal Medicine

## 2015-05-19 DIAGNOSIS — G43909 Migraine, unspecified, not intractable, without status migrainosus: Secondary | ICD-10-CM | POA: Diagnosis present

## 2015-05-19 DIAGNOSIS — G40901 Epilepsy, unspecified, not intractable, with status epilepticus: Secondary | ICD-10-CM | POA: Insufficient documentation

## 2015-05-19 DIAGNOSIS — G43919 Migraine, unspecified, intractable, without status migrainosus: Secondary | ICD-10-CM

## 2015-05-19 DIAGNOSIS — J4520 Mild intermittent asthma, uncomplicated: Secondary | ICD-10-CM | POA: Diagnosis not present

## 2015-05-19 DIAGNOSIS — T407X1A Poisoning by cannabis (derivatives), accidental (unintentional), initial encounter: Secondary | ICD-10-CM | POA: Diagnosis present

## 2015-05-19 DIAGNOSIS — M419 Scoliosis, unspecified: Secondary | ICD-10-CM | POA: Diagnosis present

## 2015-05-19 DIAGNOSIS — R55 Syncope and collapse: Secondary | ICD-10-CM | POA: Diagnosis present

## 2015-05-19 DIAGNOSIS — R569 Unspecified convulsions: Secondary | ICD-10-CM

## 2015-05-19 DIAGNOSIS — S0990XA Unspecified injury of head, initial encounter: Secondary | ICD-10-CM | POA: Insufficient documentation

## 2015-05-19 DIAGNOSIS — G92 Toxic encephalopathy: Secondary | ICD-10-CM | POA: Diagnosis present

## 2015-05-19 DIAGNOSIS — E872 Acidosis, unspecified: Secondary | ICD-10-CM | POA: Diagnosis present

## 2015-05-19 DIAGNOSIS — F1410 Cocaine abuse, uncomplicated: Secondary | ICD-10-CM | POA: Diagnosis present

## 2015-05-19 DIAGNOSIS — T405X1A Poisoning by cocaine, accidental (unintentional), initial encounter: Secondary | ICD-10-CM | POA: Diagnosis present

## 2015-05-19 DIAGNOSIS — Z87898 Personal history of other specified conditions: Secondary | ICD-10-CM | POA: Diagnosis present

## 2015-05-19 DIAGNOSIS — J45909 Unspecified asthma, uncomplicated: Secondary | ICD-10-CM | POA: Diagnosis present

## 2015-05-19 DIAGNOSIS — G9340 Encephalopathy, unspecified: Secondary | ICD-10-CM | POA: Diagnosis present

## 2015-05-19 DIAGNOSIS — F1210 Cannabis abuse, uncomplicated: Secondary | ICD-10-CM | POA: Diagnosis present

## 2015-05-19 HISTORY — DX: Epilepsy, unspecified, not intractable, with status epilepticus: G40.901

## 2015-05-19 HISTORY — DX: Encephalopathy, unspecified: G93.40

## 2015-05-19 LAB — CBC
HCT: 35.3 % — ABNORMAL LOW (ref 36.0–46.0)
Hemoglobin: 12.1 g/dL (ref 12.0–15.0)
MCH: 31.3 pg (ref 26.0–34.0)
MCHC: 34.3 g/dL (ref 30.0–36.0)
MCV: 91.2 fL (ref 78.0–100.0)
Platelets: 202 10*3/uL (ref 150–400)
RBC: 3.87 MIL/uL (ref 3.87–5.11)
RDW: 12.8 % (ref 11.5–15.5)
WBC: 8.1 10*3/uL (ref 4.0–10.5)

## 2015-05-19 LAB — POC URINE PREG, ED: Preg Test, Ur: NEGATIVE

## 2015-05-19 LAB — HIV ANTIBODY (ROUTINE TESTING W REFLEX): HIV Screen 4th Generation wRfx: NONREACTIVE

## 2015-05-19 LAB — BASIC METABOLIC PANEL
Anion gap: 9 (ref 5–15)
BUN: 7 mg/dL (ref 6–20)
CO2: 22 mmol/L (ref 22–32)
Calcium: 8.4 mg/dL — ABNORMAL LOW (ref 8.9–10.3)
Chloride: 109 mmol/L (ref 101–111)
Creatinine, Ser: 0.58 mg/dL (ref 0.44–1.00)
GFR calc Af Amer: >60 mL/min (ref >60)
GFR calc non Af Amer: >60 mL/min (ref >60)
Glucose, Bld: 88 mg/dL (ref 65–99)
Potassium: 3.5 mmol/L (ref 3.5–5.1)
Sodium: 140 mmol/L (ref 135–145)

## 2015-05-19 LAB — ETHANOL: Alcohol, Ethyl (B): 98 mg/dL — ABNORMAL HIGH (ref <5)

## 2015-05-19 LAB — APTT: aPTT: 28 seconds (ref 24–37)

## 2015-05-19 LAB — PROTIME-INR
INR: 1.1 (ref 0.00–1.49)
Prothrombin Time: 14.3 seconds (ref 11.6–15.2)

## 2015-05-19 LAB — RAPID URINE DRUG SCREEN, HOSP PERFORMED
Amphetamines: NOT DETECTED
Barbiturates: NOT DETECTED
Benzodiazepines: POSITIVE — AB
Cocaine: POSITIVE — AB
Opiates: NOT DETECTED
Tetrahydrocannabinol: POSITIVE — AB

## 2015-05-19 LAB — LACTIC ACID, PLASMA: Lactic Acid, Venous: 2 mmol/L (ref 0.5–2.0)

## 2015-05-19 LAB — MRSA PCR SCREENING: MRSA by PCR: NEGATIVE

## 2015-05-19 MED ORDER — SODIUM CHLORIDE 0.9 % IJ SOLN
3.0000 mL | Freq: Two times a day (BID) | INTRAMUSCULAR | Status: DC
  Administered 2015-05-19: 3 mL via INTRAVENOUS

## 2015-05-19 MED ORDER — SODIUM CHLORIDE 0.9 % IV SOLN
INTRAVENOUS | Status: DC
  Administered 2015-05-19: 100 mL/h via INTRAVENOUS

## 2015-05-19 MED ORDER — NORETHINDRONE-ETH ESTRADIOL 1-35 MG-MCG PO TABS: 1.0000 | ORAL_TABLET | Freq: Every day | ORAL | Status: DC

## 2015-05-19 MED ORDER — SODIUM CHLORIDE 0.9 % IV SOLN: 500.0000 mg | Freq: Two times a day (BID) | INTRAVENOUS | Status: DC

## 2015-05-19 MED ORDER — ONDANSETRON HCL 4 MG/2ML IJ SOLN: 4.0000 mg | Freq: Four times a day (QID) | INTRAMUSCULAR | Status: DC | PRN

## 2015-05-19 MED ORDER — ONDANSETRON HCL 4 MG PO TABS: 4.0000 mg | ORAL_TABLET | Freq: Four times a day (QID) | ORAL | Status: DC | PRN

## 2015-05-19 MED ORDER — HEPARIN SODIUM (PORCINE) 5000 UNIT/ML IJ SOLN
5000.0000 [IU] | Freq: Three times a day (TID) | INTRAMUSCULAR | Status: DC
  Administered 2015-05-19 (×2): 5000 [IU] via SUBCUTANEOUS
  Filled 2015-05-19 (×2): qty 1

## 2015-05-19 MED ORDER — INFLUENZA VAC SPLIT QUAD 0.5 ML IM SUSY: 0.5000 mL | PREFILLED_SYRINGE | INTRAMUSCULAR | Status: DC

## 2015-05-19 MED ORDER — ACETAMINOPHEN 325 MG PO TABS: 650.0000 mg | ORAL_TABLET | Freq: Four times a day (QID) | ORAL | Status: DC | PRN

## 2015-05-19 MED ORDER — SODIUM CHLORIDE 0.9 % IV SOLN: INTRAVENOUS | Status: DC

## 2015-05-19 MED ORDER — IBUPROFEN 200 MG PO TABS: 800.0000 mg | ORAL_TABLET | Freq: Three times a day (TID) | ORAL | Status: DC | PRN

## 2015-05-19 MED ORDER — ACETAMINOPHEN 650 MG RE SUPP
650.0000 mg | Freq: Four times a day (QID) | RECTAL | Status: DC | PRN
Start: 2015-05-19 — End: 2015-05-19

## 2015-05-19 NOTE — Consult Note (Signed)
Consult Reason for Consult: seizure Referring Physician: Dr Preston Fleeting [facility] ED  CC: seizures  HPI: Erin Price is an 24 y.o. female with hx of seizures brought in by EMS after having loss of consciousness with possible seizure activity. EMS notes she hit her head on the concrete. Had 3 more seizures en route to ED. Given 5mg  versed and 2mg  narcan by EMS. Did not return to baseline. Had 2 further episodes of generalized shaking in the ED. Has remained nonverbal, unresponsive since the initial episode. Of note, her significant other just broke up with her tonight. Currently getting loaded with keppra 1000mg  in the ED. CT head imaging reviewed and overall unremarkable. Lab workup unremarkable, is afebrile.   Mother reports patient has had similar episodes in the past starting at age 60. Unclear etiology, not on any AED.   Past Medical History  Diagnosis Date  . Seizures   . Closed head injury   . Scoliosis   . Asthma     Past Surgical History  Procedure Laterality Date  . Back surgery      screws placed for scoliosis    Family History  Problem Relation Age of Onset  . Anesthesia problems Neg Hx   . Hypertension Father   . Diabetes Maternal Grandmother   . Hypertension Maternal Grandmother   . Diabetes Paternal Grandmother   . Hypertension Paternal Grandmother     Social History:  reports that she has never smoked. She has never used smokeless tobacco. She reports that she does not drink alcohol or use illicit drugs.  No Known Allergies  Medications: I have reviewed the patient's current medications.  ROS: Out of a complete 14 system review, the patient complains of only the following symptoms, and all other reviewed systems are negative. Unable to obtain  Physical Examination: Filed Vitals:   05/18/15 2345  BP: 113/61  Pulse: 103  Temp:   Resp: 20   Physical Exam  Constitutional: He appears well-developed and well-nourished.  Psych: Affect appropriate to  situation Eyes: No scleral injection HENT: No OP obstrucion Head: Normocephalic.  Cardiovascular: Normal rate and regular rhythm.  Respiratory: Effort normal and breath sounds normal.  GI: Soft. Bowel sounds are normal. No distension. There is no tenderness.  Skin: WDI  Neurologic Examination Mental Status: Eyes closed, non-verbal, not following commands Cranial Nerves: II: optic discs not visualized, pupils equal, round, reactive to light  III,IV, VI: ptosis not present, eyes midline, no gaze deviation V,VII: face symmetric, facial light touch sensation normal bilaterally VIII: hearing normal bilaterally IX,X: gag reflex present [id number] to test XII: unable to test  Motor: Flaccid extremities throughout. No movement to command Of note will guard face when arm dropped. Noted movement of right hand when placed off side of bed Sensory: no withdrawal to noxious stimuli Deep Tendon Reflexes: 2+ and symmetric throughout Plantars: Right: downgoing   Left: downgoing Cerebellar: Unable to test Gait: unable to test  Laboratory Studies:   Basic Metabolic Panel:  Recent Labs Lab 05/18/15 2230  NA 143  K 3.5  CL 112*  CO2 15*  GLUCOSE 86  BUN 10  CREATININE 0.64  CALCIUM 8.8*    Liver Function Tests:  Recent Labs Lab 05/18/15 2230  AST 21  ALT 13*  ALKPHOS 57  BILITOT 0.3  PROT 6.8  ALBUMIN 3.6   No results for input(s): LIPASE, AMYLASE in the last 168 hours. No results for input(s): AMMONIA in the last 168 hours.  CBC:  Recent Labs Lab  05/18/15 2230  WBC 8.4  NEUTROABS 4.9  HGB 12.8  HCT 37.5  MCV 92.1  PLT 213    Cardiac Enzymes: No results for input(s): CKTOTAL, CKMB, CKMBINDEX, TROPONINI in the last 168 hours.  BNP: Invalid input(s): POCBNP  CBG:  Recent Labs Lab 05/18/15 2230  GLUCAP 93    Microbiology: Results for orders placed or performed during the hospital encounter of 05/11/15  Wet prep, genital     Status: Abnormal    Collection Time: 05/11/15  3:40 AM  Result Value Ref Range Status   Yeast Wet Prep HPF POC NONE SEEN NONE SEEN Final   Trich, Wet Prep NONE SEEN NONE SEEN Final   Clue Cells Wet Prep HPF POC FEW (A) NONE SEEN Final   WBC, Wet Prep HPF POC MODERATE (A) NONE SEEN Final    Coagulation Studies: No results for input(s): LABPROT, INR in the last 72 hours.  Urinalysis: No results for input(s): COLORURINE, LABSPEC, PHURINE, GLUCOSEU, HGBUR, BILIRUBINUR, KETONESUR, PROTEINUR, UROBILINOGEN, NITRITE, LEUKOCYTESUR in the last 168 hours.  Invalid input(s): APPERANCEUR  Lipid Panel:  No results found for: CHOL, TRIG, HDL, CHOLHDL, VLDL, LDLCALC  HgbA1C: No results found for: HGBA1C  Urine Drug Screen:     Component Value Date/Time   LABOPIA NONE DETECTED 02/06/2014 1649   COCAINSCRNUR NONE DETECTED 02/06/2014 1649   LABBENZ NONE DETECTED 02/06/2014 1649   AMPHETMU NONE DETECTED 02/06/2014 1649   THCU NONE DETECTED 02/06/2014 1649   LABBARB NONE DETECTED 02/06/2014 1649    Alcohol Level: No results for input(s): ETH in the last 168 hours.  Other results:  Imaging: Ct Head Wo Contrast  05/18/2015   CLINICAL DATA:  Seizure.  Unresponsive.  EXAM: CT HEAD WITHOUT CONTRAST  TECHNIQUE: Contiguous axial images were obtained from the base of the skull through the vertex without intravenous contrast.  COMPARISON:  03/08/2015  FINDINGS: No acute cortical infarct, hemorrhage, or mass lesion ispresent. Ventricles are of normal size. No significant extra-axial fluid collection is present. The paranasal sinuses andmastoid air cells are clear. The osseous skull is intact.  IMPRESSION: Normal brain.   Electronically Signed   By: Signa Kell M.D.   On: 05/18/2015 23:33     Assessment/Plan:  23y/o woman reported history of seizures presenting with multiple episodes concerning for seizure activity without return to baseline mental status. Differential includes seizure with current NCSE vs possible  non-epileptic seizures. Currently being loaded with keppra.   -stat EEG -UDS, ethanol level -keppra  BID -MRI brain with and without contrast -seizure precautions -further workup and plan pending EEG results  Elspeth Cho, DO [facility] [phone]  If 7pm- 7am, please page neurology on call as listed in AMION. 05/19/2015, 12:07 AM

## 2015-05-19 NOTE — Progress Notes (Signed)
Subjective: Much more awake.   Exam: Filed Vitals:   05/19/15 0400  BP: 92/57  Pulse:   Temp:   Resp: 18   Gen: In bed, NAD Resp: non-labored breathing, no acute distress Abd: soft, nt  Neuro: MS: Awake, alert, interactive and appropriate [id number], face symmetric Motor: 5/5 throughout Sensory:intact to LT  Pertinent Labs: UDS + for cocaine.   Impression: 24 yo F with what is likely a provoked seizure in the setting of cocaine use. There was also some possible concern for psychogenic episodes, though without having witnessed the episodes, I am not able to comment on this. I am not certain if her first seizure was unprovoked or not, but with this episode being provoked, I would favor cocaine avoidance as opposed to starting an AED. In a 62 yo F with child bearing years ahead of her, I think that the decision to start an AED based on a provoked seizure would be overly aggressive.   Her last episode lasted only 10 seconds and reading the description, whether truly seizure or not is unclear to me.   If she were to have further unprovoked seizures, then starting an AED at that time would be indicated.   Also possibly useful would be a repeat EEG in the furture.  Recommendations: 1) Will discontinue keppra.  2) Avoid cocaine 3) follow up as outpatient with neurology 4) Patient is unable to drive, operate heavy machinery, perform activities at heights or participate in water activities until release by outpatient physician. This was discussed with the patient who expressed understanding.    Ritta Slot, MD [facility] [phone]  If 7pm- 7am, please page neurology on call as listed in AMION.

## 2015-05-19 NOTE — Progress Notes (Signed)
STAT EEG completed; results pending.  LKristeen Mans, R.EEGT.

## 2015-05-19 NOTE — ED Notes (Signed)
Pt now responding to staff; alert and oriented x 3 and following commands

## 2015-05-19 NOTE — Progress Notes (Signed)
PT Cancellation Note  Patient Details Name: Erin Price MRN: [id number] DOB: 07/22/1991   Cancelled Treatment:    Reason Eval/Treat Not Completed: Other (comment) (pt currently on strict bedrest and await increased activity order)   Toney Sang Beth 05/19/2015, 7:26 AM Delaney Meigs, PT [phone]

## 2015-05-19 NOTE — ED Notes (Signed)
Dr.Niu aware of piercings present; MRI held at this time

## 2015-05-19 NOTE — Progress Notes (Signed)
Discharged instructions discussed with patient regarding follow up appointments, medication schedule, and seizure precautions. Step-dad is driving her home. Both IVs removed.

## 2015-05-19 NOTE — ED Notes (Signed)
Pt has a tongue ring present; pt states she has to go to the place she received the piercing to have the ring removed. MRI unable to complete scan at this time

## 2015-05-19 NOTE — Discharge Summary (Signed)
Physician Discharge Summary  Erin Price ZOX:[id number] DOB: November 10, 1990 DOA: 05/18/2015  PCP: No PCP Per Patient  Admit date: 05/18/2015 Discharge date: 05/19/2015  Time spent: 35 minutes  Recommendations for Outpatient Follow-up:  Acute encephalopathy: -Most likely secondary to substance abuse (cocaine, marijuana). -Patient evaluated by Rejeana Brock (neurology) concurred most likely cause of etiology. -Per neurology recommendation anti-epileptic medication to be started. -Establish care with [facility] in 3 months for evaluation of seizures and clearance to drive vehicles, operate heavy machinery, anticipate in water activities -Patient is unable to drive, operate heavy machinery, perform activities at heights or participate in water activities until release by outpatient physician. This was discussed with the patient who expressed understanding  Substance abuse -Counseled patient at length concerning sequela of continuing to use the sepsis is to include brain damage and death  -2 weeks post discharge establish care with [facility] for substance abuse, asthma, migraine  Asthma: stable. Not on medications. Lung is clear to auscultation. -observe respiratory function closely.  Migraine: - continue home when necessary ibuprofen  Metabolic acidosis: -Resolved     Discharge Diagnoses:  Principal Problem:   Acute encephalopathy Active Problems:   Scoliosis   Asthma   Migraine   Metabolic acidosis   Seizures   Discharge Condition: Stable  Diet recommendation: Regular  Filed Weights   05/19/15 0236  Weight: 64.5 kg (142 lb 3.2 oz)    History of present illness:  24 yo HF with what is likely a provoked seizure in the setting of cocaine use. There was also some possible concern for psychogenic episodes, though without having witnessed the episodes, I am not able to comment on this. I am not certain if her first  seizure was unprovoked or not, but with this episode being provoked, I would favor cocaine avoidance as opposed to starting an AED. In a 74 yo F with child bearing years ahead of her, I think that the decision to start an AED based on a provoked seizure would be overly aggressive.   Her last episode lasted only 10 seconds and reading the description, whether truly seizure or not is unclear to me.   If she were to have further unprovoked seizures, then starting an AED at that time would be indicated.   Also possibly useful would be a repeat EEG in the furture  Procedures: 8/26 EEG;Normal electroencephalogram, awake, asleep   Consultations: Rejeana Brock (neurology)       Discharge Exam: Filed Vitals:   05/19/15 0400 05/19/15 0825 05/19/15 1205 05/19/15 1232  BP: 92/57 102/60 102/87   Pulse:  66 83 85  Temp:  97.8 F (36.6 C)  97.8 F (36.6 C)  TempSrc:  Oral  Oral  Resp: 18 16 14 15   Height:      Weight:      SpO2: 96% 99% 100% 100%    General: A/O 4, NAD Cardiovascular: Regular rhythm and rate, negative murmurs rubs or gallops, normal S1/S2  Respiratory: Clear to auscultation bilateral Neurologic; pupils equal round reactive to light and accommodation, cranial nerves II through XII intact, tongue/uvula midline, all extremity strength 5/5, sensation intact.   Discharge Instructions     Medication List    STOP taking these medications        ibuprofen 800 MG tablet  Commonly known as:  ADVIL,MOTRIN      TAKE these medications        norethindrone-ethinyl estradiol 1/35 tablet  Commonly known as:  ORTHO-NOVUM, NORTREL,CYCLAFEM  Take 1 tablet by mouth daily.       No Known Allergies Follow-up Information    Follow up with [facility] In 3 months.   Why:  Establish care for evaluation of seizures and clearance to drive vehicles, operate heavy machinery, anticipate in water activities   Contact information:   [address] [place] [address] [phone]      Follow up with [facility]    . Schedule an appointment as soon as possible for a visit in 2 weeks.   Why:  2 weeks post discharge establish care with [facility] for substance abuse, asthma, migraine   Contact information:   [address] [facility] [address] [phone]       The results of significant diagnostics from this hospitalization (including imaging, microbiology, ancillary and laboratory) are listed below for reference.    Significant Diagnostic Studies: Ct Head Wo Contrast  05/18/2015   CLINICAL DATA:  Seizure.  Unresponsive.  EXAM: CT HEAD WITHOUT CONTRAST  TECHNIQUE: Contiguous axial images were obtained from the base of the skull through the vertex without intravenous contrast.  COMPARISON:  03/08/2015  FINDINGS: No acute cortical infarct, hemorrhage, or mass lesion ispresent. Ventricles are of normal size. No significant extra-axial fluid collection is present. The paranasal sinuses andmastoid air cells are clear. The osseous skull is intact.  IMPRESSION: Normal brain.   Electronically Signed   By: Signa Kell M.D.   On: 05/18/2015 23:33   Mr Laqueta Jean NU Contrast  05/19/2015   CLINICAL DATA:  24 year old female with syncope, fall, and subsequent seizure. Initial encounter.  EXAM: MRI HEAD WITHOUT AND WITH CONTRAST  TECHNIQUE: Multiplanar, multiecho pulse sequences of the brain and surrounding structures were obtained without and with intravenous contrast.  CONTRAST:  10 mL MultiHance  COMPARISON:  Head CTs without contrast 05/18/2015 and earlier.  FINDINGS: Mild susceptibility artifact in the oral cavity, not affecting visualization of the brain. Cerebral volume is normal. No restricted diffusion to suggest acute infarction. No midline shift, mass effect, evidence of mass lesion, ventriculomegaly, extra-axial collection or acute  intracranial hemorrhage. Cervicomedullary junction and pituitary are within normal limits. Major intracranial vascular flow voids are within normal limits.  Hippocampal formations and other mesial temporal lobe structures appear symmetric and within normal limits. Wallace Cullens and white matter signal is within normal limits throughout the brain. Cerebral morphology appears normal. No chronic cerebral blood products identified. No abnormal enhancement identified.  Visible internal auditory structures appear normal. Mastoids and paranasal sinuses are clear. Negative orbit and scalp soft tissues. Negative visualized cervical spine. Visualized bone marrow signal is within normal limits.  IMPRESSION: Normal MRI appearance of the brain.   Electronically Signed   By: Odessa Fleming M.D.   On: 05/19/2015 11:08    Microbiology: Recent Results (from the past 240 hour(s))  Wet prep, genital     Status: Abnormal   Collection Time: 05/11/15  3:40 AM  Result Value Ref Range Status   Yeast Wet Prep HPF POC NONE SEEN NONE SEEN Final   Trich, Wet Prep NONE SEEN NONE SEEN Final   Clue Cells Wet Prep HPF POC FEW (A) NONE SEEN Final   WBC, Wet Prep HPF POC MODERATE (A) NONE SEEN Final  MRSA PCR Screening     Status: None   Collection Time: 05/19/15  3:23 AM  Result Value Ref Range Status  MRSA by PCR NEGATIVE NEGATIVE Final    Comment:        The GeneXpert MRSA Assay (FDA approved for NASAL specimens only), is one component of a comprehensive MRSA colonization surveillance program. It is not intended to diagnose MRSA infection nor to guide or monitor treatment for MRSA infections.      Labs: Basic Metabolic Panel:  Recent Labs Lab 05/18/15 2230 05/19/15 0154  NA 143 140  K 3.5 3.5  CL 112* 109  CO2 15* 22  GLUCOSE 86 88  BUN 10 7  CREATININE 0.64 0.58  CALCIUM 8.8* 8.4*   Liver Function Tests:  Recent Labs Lab 05/18/15 2230  AST 21  ALT 13*  ALKPHOS 57  BILITOT 0.3  PROT 6.8  ALBUMIN 3.6   No  results for input(s): LIPASE, AMYLASE in the last 168 hours. No results for input(s): AMMONIA in the last 168 hours. CBC:  Recent Labs Lab 05/18/15 2230 05/19/15 0154  WBC 8.4 8.1  NEUTROABS 4.9  --   HGB 12.8 12.1  HCT 37.5 35.3*  MCV 92.1 91.2  PLT 213 202   Cardiac Enzymes: No results for input(s): CKTOTAL, CKMB, CKMBINDEX, TROPONINI in the last 168 hours. BNP: BNP (last 3 results) No results for input(s): BNP in the last 8760 hours.  ProBNP (last 3 results) No results for input(s): PROBNP in the last 8760 hours.  CBG:  Recent Labs Lab 05/18/15 2230  GLUCAP 93       Signed:  Carolyne Littles, MD [facility] [phone] pager

## 2015-05-19 NOTE — ED Notes (Signed)
Neurology at bedside.

## 2015-05-19 NOTE — ED Notes (Signed)
EEG at bedside.

## 2015-05-19 NOTE — Progress Notes (Signed)
Bedside swallow done at bedside by me. Pt. Awake and alert. Swallowed water without coughing or change in o2 saturation. Will start her on a diet now that her swallowing looks adequate as per Dr. Amada Jupiter.

## 2015-05-19 NOTE — Procedures (Signed)
ELECTROENCEPHALOGRAM REPORT   Patient: Erin Price      Room #: J19 Age: 24 y.o.        Sex: female Referring Physician: Dr Preston Fleeting [facility] ED Report Date:  05/19/2015        Interpreting Physician: Omelia Blackwater  History: [name] is an 24 y.o. female admitted with recurrent episodes concerning for seizure with continued unresponsiveness  Medications:  Scheduled: . heparin  5,000 Units Subcutaneous 3 times per day  . norethindrone-ethinyl estradiol 1/35  1 tablet Oral Daily  . sodium chloride  3 mL Intravenous Q12H    Conditions of Recording:  This is a 16 channel EEG carried out with the patient in the drowsy state.  Description:  The waking background activity consists of a low voltage, symmetrical, fairly well organized, 9-11 Hz alpha activity, with frequent theta activity seen from the parieto-occipital and posterior temporal regions.  No focal slowing or epileptiform activity is noted.   The patient drowses with slowing to irregular, low voltage theta and beta activity. The patient goes in to a light sleep with symmetrical sleep spindles, vertex central sharp transients and irregular slow activity.    Hyperventilation was not performed. Intermittent photic stimulation was not performed.    IMPRESSION: Normal electroencephalogram, awake, asleep. There are no focal lateralizing or epileptiform features.   Elspeth Cho, DO [facility] [phone]  If 7pm- 7am, please page neurology on call as listed in AMION. 05/19/2015, 1:41 AM

## 2015-05-19 NOTE — Progress Notes (Signed)
Utilization Review Completed.  

## 2015-06-01 ENCOUNTER — Telehealth (HOSPITAL_COMMUNITY): Payer: Self-pay

## 2015-06-01 NOTE — Telephone Encounter (Signed)
Unable to contact pt by mail or telephone. Unable to communicate lab results or treatment changes. 

## 2015-07-18 ENCOUNTER — Inpatient Hospital Stay (HOSPITAL_COMMUNITY)
Admission: AD | Admit: 2015-07-18 | Discharge: 2015-07-18 | Discharge status: Against Medical Advice | Payer: Medicaid Other | Source: Ambulatory Visit | Attending: Obstetrics & Gynecology | Admitting: Obstetrics & Gynecology

## 2015-07-18 NOTE — MAU Note (Signed)
Not in lobby #3; attempted to call pt, "# not in service"

## 2015-07-18 NOTE — MAU Note (Signed)
Not in lobby

## 2015-07-18 NOTE — MAU Note (Signed)
Not in lobby, adm clerk states pt went outside for phone call

## 2015-08-20 ENCOUNTER — Encounter (HOSPITAL_COMMUNITY): Payer: Self-pay | Admitting: Family Medicine

## 2015-08-20 ENCOUNTER — Emergency Department (HOSPITAL_COMMUNITY)
Admission: EM | Admit: 2015-08-20 | Discharge: 2015-08-21 | Discharge status: Against Medical Advice | Payer: Medicaid Other | Attending: Emergency Medicine | Admitting: Emergency Medicine

## 2015-08-20 DIAGNOSIS — R21 Rash and other nonspecific skin eruption: Secondary | ICD-10-CM | POA: Diagnosis not present

## 2015-08-20 DIAGNOSIS — R1032 Left lower quadrant pain: Secondary | ICD-10-CM | POA: Diagnosis present

## 2015-08-20 DIAGNOSIS — R319 Hematuria, unspecified: Secondary | ICD-10-CM | POA: Insufficient documentation

## 2015-08-20 DIAGNOSIS — R350 Frequency of micturition: Secondary | ICD-10-CM | POA: Diagnosis not present

## 2015-08-20 DIAGNOSIS — J45909 Unspecified asthma, uncomplicated: Secondary | ICD-10-CM | POA: Diagnosis not present

## 2015-08-20 LAB — URINALYSIS, ROUTINE W REFLEX MICROSCOPIC
Bilirubin Urine: NEGATIVE
Glucose, UA: NEGATIVE mg/dL
Ketones, ur: NEGATIVE mg/dL
Leukocytes, UA: NEGATIVE
Nitrite: NEGATIVE
Protein, ur: NEGATIVE mg/dL
Specific Gravity, Urine: 1.027 (ref 1.005–1.030)
pH: 6.5 (ref 5.0–8.0)

## 2015-08-20 LAB — URINE MICROSCOPIC-ADD ON

## 2015-08-20 LAB — POC URINE PREG, ED: Preg Test, Ur: NEGATIVE

## 2015-08-20 NOTE — ED Notes (Signed)
Pt called for a room and labs no answer from lobby x2

## 2015-08-20 NOTE — ED Notes (Signed)
Pt is complaining of left lower abd pain that started yesterday morning. No radiation of pain. Denies nausea, vomiting, diarrhea, fever, and vaginal discharge. Reports hematuria. Denies any increase in frequency and urgency. Itching all over with bruises.

## 2015-08-20 NOTE — ED Notes (Signed)
Called patient for assigned room but no answer.

## 2015-08-20 NOTE — ED Notes (Addendum)
Called patient for assigned room but no answer.  

## 2015-08-21 NOTE — ED Notes (Signed)
Patient was called three times with no answer.  

## 2015-09-06 ENCOUNTER — Emergency Department (HOSPITAL_COMMUNITY)
Admission: EM | Admit: 2015-09-06 | Discharge: 2015-09-06 | Disposition: A | Discharge status: Home / Self Care | Payer: Medicaid Other | Attending: Emergency Medicine | Admitting: Emergency Medicine

## 2015-09-06 ENCOUNTER — Encounter (HOSPITAL_COMMUNITY): Payer: Self-pay | Admitting: Emergency Medicine

## 2015-09-06 DIAGNOSIS — L819 Disorder of pigmentation, unspecified: Secondary | ICD-10-CM | POA: Diagnosis not present

## 2015-09-06 DIAGNOSIS — Z793 Long term (current) use of hormonal contraceptives: Secondary | ICD-10-CM | POA: Insufficient documentation

## 2015-09-06 DIAGNOSIS — Z8739 Personal history of other diseases of the musculoskeletal system and connective tissue: Secondary | ICD-10-CM | POA: Insufficient documentation

## 2015-09-06 DIAGNOSIS — B360 Pityriasis versicolor: Secondary | ICD-10-CM | POA: Insufficient documentation

## 2015-09-06 DIAGNOSIS — Z8679 Personal history of other diseases of the circulatory system: Secondary | ICD-10-CM | POA: Diagnosis not present

## 2015-09-06 DIAGNOSIS — M7981 Nontraumatic hematoma of soft tissue: Secondary | ICD-10-CM | POA: Diagnosis not present

## 2015-09-06 DIAGNOSIS — J45909 Unspecified asthma, uncomplicated: Secondary | ICD-10-CM | POA: Insufficient documentation

## 2015-09-06 DIAGNOSIS — L298 Other pruritus: Secondary | ICD-10-CM | POA: Diagnosis present

## 2015-09-06 DIAGNOSIS — Z87828 Personal history of other (healed) physical injury and trauma: Secondary | ICD-10-CM | POA: Insufficient documentation

## 2015-09-06 DIAGNOSIS — R21 Rash and other nonspecific skin eruption: Secondary | ICD-10-CM

## 2015-09-06 LAB — CBC WITH DIFFERENTIAL/PLATELET
Basophils Absolute: 0 10*3/uL (ref 0.0–0.1)
Basophils Relative: 0 %
Eosinophils Absolute: 0.2 10*3/uL (ref 0.0–0.7)
Eosinophils Relative: 2 %
HCT: 39.5 % (ref 36.0–46.0)
Hemoglobin: 13.3 g/dL (ref 12.0–15.0)
Lymphocytes Relative: 33 %
Lymphs Abs: 2.4 10*3/uL (ref 0.7–4.0)
MCH: 30.8 pg (ref 26.0–34.0)
MCHC: 33.7 g/dL (ref 30.0–36.0)
MCV: 91.4 fL (ref 78.0–100.0)
Monocytes Absolute: 0.5 10*3/uL (ref 0.1–1.0)
Monocytes Relative: 7 %
Neutro Abs: 4.1 10*3/uL (ref 1.7–7.7)
Neutrophils Relative %: 58 %
Platelets: 192 10*3/uL (ref 150–400)
RBC: 4.32 MIL/uL (ref 3.87–5.11)
RDW: 12.3 % (ref 11.5–15.5)
WBC: 7.2 10*3/uL (ref 4.0–10.5)

## 2015-09-06 LAB — COMPREHENSIVE METABOLIC PANEL
ALT: 13 U/L — ABNORMAL LOW (ref 14–54)
AST: 20 U/L (ref 15–41)
Albumin: 4.5 g/dL (ref 3.5–5.0)
Alkaline Phosphatase: 61 U/L (ref 38–126)
Anion gap: 7 (ref 5–15)
BUN: 12 mg/dL (ref 6–20)
CO2: 26 mmol/L (ref 22–32)
Calcium: 9.5 mg/dL (ref 8.9–10.3)
Chloride: 103 mmol/L (ref 101–111)
Creatinine, Ser: 0.48 mg/dL (ref 0.44–1.00)
GFR calc Af Amer: >60 mL/min (ref >60)
GFR calc non Af Amer: >60 mL/min (ref >60)
Glucose, Bld: 92 mg/dL (ref 65–99)
Potassium: 3.7 mmol/L (ref 3.5–5.1)
Sodium: 136 mmol/L (ref 135–145)
Total Bilirubin: 0.7 mg/dL (ref 0.3–1.2)
Total Protein: 8 g/dL (ref 6.5–8.1)

## 2015-09-06 MED ORDER — SELENIUM SULFIDE 2.5 % EX LOTN: 1.0000 "application " | TOPICAL_LOTION | Freq: Every day | CUTANEOUS | Status: DC | PRN

## 2015-09-06 NOTE — ED Provider Notes (Signed)
CSN: [id number]     Arrival date & time 09/06/15  1531 History  By signing my name below, I, Octavia Heirrianna Nassar, attest that this documentation has been prepared under the direction and in the presence of Melburn HakeNicole Jeret Goyer, PA-C. Electronically Signed: Octavia HeirArianna Nassar, ED Scribe. 09/06/2015. 4:20 PM.    Chief Complaint  Patient presents with  . Pruritis  . Bleeding/Bruising     The history is provided by the patient. No language interpreter was used.   HPI Comments: Erin Price is a 24 y.o. female who presents to the Emergency Department complaining of constant, gradual worsening itching with associated bruising onset about 3 weeks ago. Pt notes her bruising started about one week ago and comes up after she scratches. She states she is itching on her bilateral upper and lower extremities and back. She reports her boyfriend has had the same symptoms since he got back from [place]. She denies new environmental changes, any injury, rash, fever, chills, difficulty breathing, nausea, vomiting, and diarrhea.   Past Medical History  Diagnosis Date  . Seizures (HCC)   . Closed head injury   . Scoliosis   . Asthma   . Migraine    Past Surgical History  Procedure Laterality Date  . Back surgery      screws placed for scoliosis   Family History  Problem Relation Age of Onset  . Anesthesia problems Neg Hx   . Hypertension Father   . Diabetes Maternal Grandmother   . Hypertension Maternal Grandmother   . Diabetes Paternal Grandmother   . Hypertension Paternal Grandmother    Social History  Substance Use Topics  . Smoking status: Never Smoker   . Smokeless tobacco: Never Used  . Alcohol Use: No   OB History    Gravida Para Term Preterm AB TAB SAB Ectopic Multiple Living   2 2 1 1      2      Review of Systems  Constitutional: Negative for fever and chills.  Gastrointestinal: Negative for nausea, vomiting and diarrhea.  Skin: Positive for rash.       itching  All other systems  reviewed and are negative.     Allergies  Review of patient's allergies indicates no known allergies.  Home Medications   Prior to Admission medications   Medication Sig Start Date End Date Taking? Authorizing Provider  norethindrone-ethinyl estradiol 1/35 (ORTHO-NOVUM, NORTREL,CYCLAFEM) tablet Take 1 tablet by mouth daily.    Historical Provider, MD  selenium sulfide (SELSUN) 2.5 % shampoo Apply 1 application topically daily as needed for irritation. 09/06/15   Barrett HenleNicole Elizabeth Cam Dauphin, PA-C   Triage vitals: BP 115/77 mmHg  Pulse 93  Temp(Src) 98.1 F (36.7 C) (Oral)  Resp 18  SpO2 99%  LMP 08/06/2015 Physical Exam  Constitutional: She appears well-developed and well-nourished. No distress.  HENT:  Head: Normocephalic and atraumatic.  Right Ear: Tympanic membrane normal.  Left Ear: Tympanic membrane normal.  Mouth/Throat: Uvula is midline, oropharynx is clear and moist and mucous membranes are normal. No oral lesions.  Eyes: Right eye exhibits no discharge. Left eye exhibits no discharge.  Pulmonary/Chest: Effort normal. No respiratory distress.  Neurological: She is alert. Coordination normal.  Skin: No rash noted. She is not diaphoretic.  Multiple hyperpigmented lesions noted to bilateral upper and lower extremities and back. No vesicles, pustules, papules. Multiple excoriations noted over entire body. No lesions or excoriations noted to palms, soles or digits.  Small hypopigmented lesions noted to upper shoulders.  Psychiatric: She has  a normal mood and affect. Her behavior is normal.  Nursing note and vitals reviewed.   ED Course  Procedures  DIAGNOSTIC STUDIES: Oxygen Saturation is 99% on RA, normal by my interpretation.  COORDINATION OF CARE:  4:10 PM Discussed treatment plan with pt at bedside and pt agreed to plan.  Labs Review Labs Reviewed  COMPREHENSIVE METABOLIC PANEL - Abnormal; Notable for the following:    ALT 13 (*)    All other components within  normal limits  CBC WITH DIFFERENTIAL/PLATELET    Imaging Review No results found. I have personally reviewed and evaluated these images and lab results as part of my medical decision-making.  Filed Vitals:   09/06/15 1545  BP: 115/77  Pulse: 93  Temp: 98.1 F (36.7 C)  Resp: 18     MDM   Final diagnoses:  Tinea versicolor  Rash    Patient presents with itching and bruising to her arms and legs and back. Denies any known irritants/exposures. Reports her boyfriend returned from [place] a few weeks ago and had similar symptoms. Denies fever. VSS. Exam revealed multiple hyperpigmented lesions to arms, legs and back, multiple excoriations noted over entire body. No vesicles, pustules, papules, burrows noted. No rash on palms or soles.  Hyper pigmented lesions noted to shoulders consistent with tinea versicolor. Due to patient's new onset of itching and possible bruising will order CBC and CMP. Labs unremarkable, platelets and bilirubin within normal limits. rash/lesions are not consistent with scabies, bedbugs, poison ivy, shingles, contact dermatitis or bacterial etiology. Discussed removing any suspected irritants causing pruritus with patient. Plan to discharge patient home with selenium sulfide prescription and symptomatic treatment. Advised patient to follow up with her primary care provider in 4-5 days.   I personally performed the services described in this documentation, which was scribed in my presence. The recorded information has been reviewed and is accurate.   Satira Sark [place], [place] 09/06/15 1800  Arby Barrette, MD 09/11/15 [phone]

## 2015-09-06 NOTE — ED Notes (Signed)
Pt states x 3 weeks ago began having full body itching with bruising coming up where ever she's scratching. Says her boyfriend is also having the same symptoms. Denies any rash, fever, chills, N/V/D

## 2015-09-06 NOTE — Discharge Instructions (Signed)
Take your medication as prescribed. You may also use Benadryl as prescribed over-the-counter for relief of itching. Please follow up with a primary care provider from the Resource Guide provided below in 4-5 days. Please return to the Emergency Department if symptoms worsen or new onset of fever, difficulty breathing, worsening/new rash, chest pain, abdominal pain, numbness, tingling, weakness.    Emergency Department Resource Guide 1) Find a Doctor and Pay Out of Pocket Although you won't have to find out who is covered by your insurance plan, it is a good idea to ask around and get recommendations. You will then need to call the office and see if the doctor you have chosen will accept you as a new patient and what types of options they offer for patients who are self-pay. Some doctors offer discounts or will set up payment plans for their patients who do not have insurance, but you will need to ask so you aren't surprised when you get to your appointment.  2) Contact Your Local Health Department Not all health departments have doctors that can see patients for sick visits, but many do, so it is worth a call to see if yours does. If you don't know where your local health department is, you can check in your phone book. The CDC also has a tool to help you locate your state's health department, and many state websites also have listings of all of their local health departments.  3) Find a Walk-in Clinic If your illness is not likely to be very severe or complicated, you may want to try a walk in clinic. These are popping up all over the country in pharmacies, drugstores, and shopping centers. They're usually staffed by nurse practitioners or physician assistants that have been trained to treat common illnesses and complaints. They're usually fairly quick and inexpensive. However, if you have serious medical issues or chronic medical problems, these are probably not your best option.  No Primary Care  Doctor: - Call Health Connect at  [phone] - they can help you locate a primary care doctor that  accepts your insurance, provides certain services, etc. - Physician Referral Service- [phone]  Chronic Pain Problems: Organization         Address  Phone   Notes  [facility]  [phone] Patients need to be referred by their primary care doctor.   Medication Assistance: Organization         Address  Phone   Notes  [facility] [address] [place], [place] [address] [phone] --Must be a resident of [facility] [facility] [place] -- Must have NO insurance coverage whatsoever (no Medicaid/ Medicare, etc.) -- The pt. MUST have a primary care doctor that directs their care regularly and follows them in the community   Med[facility]ist  [phone]   [employer]  [phone]    Agencies that provide inexpensive medical care: Organization         Address  Phone   Notes  [facility]  [phone]   [facility]    [phone]   [facility] [address] [place], [place] [address] [phone]   [facility] [address], [place] [phone]   [facility]anned [facility]    [phone]   [facility]    [phone]   [facility]  [address], [place] Phone:  [phone]  [id number], Fax:  [phone] Hours of Operation:  9 am - 6 pm, M-F.  Also accepts Medicaid/Medicare and self-pay.  [facility] for [place] [address], [address], [place] Phone: [phone], Fax: [phone]. Hours of Operation:  8:30 am - 5:30 pm, M-F.  Also accepts Medicaid and self-pay.  [facility] [place] [address], [place] Phone: [phone]   [place], [place], [place] [phone], Ext. [phone] Mondays & Thursdays: 7-9 AM.  First 15 patients are seen on a first  come, first serve basis.    [place] Providers:  Organization         Address  Phone   Notes  [facility] [place] [address], [place] [phone] Also accepts self-pay patients.  [facility] [id number] [place], [place]  [phone]   [place], [address], [place] [phone]   [facility] [address], [place] [phone]   Lucianne Lei [address], [place]   [phone] Only accepts [place] Access [place] patients after they have their name applied to their card.   Self-Pay (no insurance) in [facility]:  Organization         Address  Phone   Notes  Sickle Cell Patients, [facility] [place] [phone]   [facility] [place] [phone]   [facility] [place]  [place], [place], [place] [phone]   [facility]/Dr. Osei-Bonsu  [address] [place] or [address], [place] [phone] Phone number for both [place] and [place] locations is the same.  [facility] [address], [place] [phone]   [place] [address], [place] or [address] [phone]   [facility] [address], [place] [phone], phone; [phone], fax Sees patients 1st and 3rd Saturday of every month.  Must not qualify for public or private insurance (i.e. Medicaid, Medicare, [place] Health Choice, Veterans' Benefits)  Household income should be no more than 200% of the poverty level The clinic cannot treat you if you are pregnant or think you are pregnant  Sexually transmitted diseases are not treated at the clinic.    Dental Care: Organization          Address  Phone  Notes  [facility] [phone] Accepts children up to age 20 who are enrolled in [place] or [place]; pregnant women with a Medicaid card; and children who have applied for Medicaid or [place] Health Choice, but were declined, whose parents can pay a reduced fee at time of service.  [facility] of [facility]  [address], [place] [phone] Accepts children up to age 65 who are enrolled in [place] or [place]; pregnant women with a Medicaid card; and children who have applied for Medicaid or [place] Health Choice, but were declined, whose parents can pay a reduced fee at time of service.  [facility]  [place] [phone] Patients are seen by appointment only. Walk-ins are  not accepted. [place] will see patients 46 years of age and older. Monday - Tuesday (8am-5pm) Most Wednesdays (8:30-5pm) $30 per visit, cash only  [facility] Adult Dental Access PROGRAM  [address], [facility] [phone] Patients are seen by appointment only. Walk-ins are not accepted. [place] will see patients 60 years of age and older. One Wednesday Evening (Monthly: Volunteer Based).  $30 per visit, cash only  [place]  [phone] for adults; Children under age 9, call Graduate Pediatric Dentistry at [phone]. Children aged 38-14, please call [phone] to request a pediatric application.  Dental services are provided in all areas of dental care including fillings, crowns and bridges, complete and partial dentures, implants, gum treatment, root canals, and extractions. Preventive care is also provided. Treatment is provided to both adults and children. Patients are selected via a lottery and there is often a waiting list.   [facility] [address] [place]  [phone] [email or web address]   [facility] [address] [place], [place] [phone], Ext. [phone] Second and Fourth Thursday of each month, opens at 6:30 AM; Clinic ends at 9 AM.  Patients are seen on a first-come first-served basis, and a limited number are seen during each clinic.   [facility]  [address] Hillard Danker [place], [place] [phone]   Eligibility Requirements You must have lived in [place], [place], or [place] counties for at least the last three months.   You cannot be eligible for state or federal sponsored [employer], including [employer], [place], or [employer].   You generally cannot be eligible for healthcare insurance through your employer.    How to apply: Eligibility screenings are held every Tuesday and Wednesday afternoon from 1:00 pm until 4:00 pm. You do not need an appointment for the interview!  [facility] [address], [place], [place]   [place] Department  [place]  [phone]    Behavioral Health Resources in the Community: Intensive Outpatient Programs Organization         Address  Phone  Notes  [place]. [address] [place], [place] [phone]   [facility] [address], [place], [place]   ADS: Alcohol & Drug Svcs [address], [place], [place]   [place] [address],  [place], [place] or [phone]   Substance Abuse Resources Organization         Address  Phone  Notes  Al[facility] Drug Services  [phone]   [place]  [phone]   [facility] [place]   Chinita Pester  [phone]   Residential & Outpatient Substance Abuse Program  [phone]   Psychological  Services Organization         Address  Phone  Notes  [facility] [place]  [place]  [phone]   [place] [address], [place] [phone] or [phone]    Mobile Crisis Teams Organization         Address  Phone  Notes  [facility], Mobile Crisis Care Unit  [phone]   [facility] Psychotherapeutic [facility]  [address] [place], [place]   [facility] [address] [place] [phone]    Self-Help/Support  Groups Organization         Address  Phone             Notes  [facility] [place] - variety of support groups  [phone]- I7437963 Call for more information  Narcotics Anonymous (NA), [facility] [address], [employer] [place]  2 meetings at this location   Statistician         Address  Phone  Notes  [facility] [address],    [place]  [phone]   [facility]  [address], [place] [address], [place], [place] [phone]   [facility] [address] [place], [place] [phone] Admissions: 8am-3pm M-F  [facility] [address],    [place], [place] [phone]   [facility] [address] [place], [place], [place] [phone]   [facility] [address]  [place], [place] [phone]   [facility] - Intensive Outpatient [address], [place], [place] [phone]   [facility] (Addiction Recovery Care [facility] [address] [place].,  [place], [place] [phone] or [phone]   [facility] Services (RTS) [address], [place], [place] [phone] Accepts Medicaid  [facility] Jessie [address]  [place] [phone] Substance Abuse/Addiction Treatment   [facility] Organization         Address  Phone  Notes  [facility]  [phone]   Angie Fava, PhD [address] Ervin Knack [place], [place]   [phone] or 5306102085   [facility]   [address] [place], [place] [phone]   [facility] [address], [place], [place] [phone] Insurance/Medicaid/sponsorship through [facility] and Families [address]                                    [place], [place] [phone] Therapy/tele-psych/case  [facility] [address], [place] [phone]    Dr. Lolly Mustache  [phone]   [facility]  [employer] [facility] [place] Dept. 1) [address], [place] 2) [address] [place] 3)  [address], [place] [phone]  [phone]   [facility] Child Abuse Hotline 330-789-4746 or [phone] (After Hours)

## 2015-10-04 ENCOUNTER — Emergency Department (HOSPITAL_COMMUNITY)
Admission: EM | Admit: 2015-10-04 | Discharge: 2015-10-04 | Disposition: A | Discharge status: Home / Self Care | Payer: Medicaid Other

## 2015-10-04 NOTE — ED Notes (Signed)
Pt. Was called 3 times and did not answer.

## 2015-11-22 ENCOUNTER — Emergency Department (HOSPITAL_COMMUNITY)
Admission: EM | Admit: 2015-11-22 | Discharge: 2015-11-22 | Disposition: A | Discharge status: Home / Self Care | Payer: Medicaid Other | Attending: Emergency Medicine | Admitting: Emergency Medicine

## 2015-11-22 ENCOUNTER — Encounter (HOSPITAL_COMMUNITY): Payer: Self-pay | Admitting: *Deleted

## 2015-11-22 ENCOUNTER — Emergency Department (HOSPITAL_COMMUNITY): Payer: Medicaid Other

## 2015-11-22 DIAGNOSIS — Z8739 Personal history of other diseases of the musculoskeletal system and connective tissue: Secondary | ICD-10-CM | POA: Insufficient documentation

## 2015-11-22 DIAGNOSIS — Y9389 Activity, other specified: Secondary | ICD-10-CM | POA: Diagnosis not present

## 2015-11-22 DIAGNOSIS — R51 Headache: Secondary | ICD-10-CM

## 2015-11-22 DIAGNOSIS — Z3202 Encounter for pregnancy test, result negative: Secondary | ICD-10-CM | POA: Diagnosis not present

## 2015-11-22 DIAGNOSIS — Y92410 Unspecified street and highway as the place of occurrence of the external cause: Secondary | ICD-10-CM | POA: Diagnosis not present

## 2015-11-22 DIAGNOSIS — J45909 Unspecified asthma, uncomplicated: Secondary | ICD-10-CM | POA: Insufficient documentation

## 2015-11-22 DIAGNOSIS — Z793 Long term (current) use of hormonal contraceptives: Secondary | ICD-10-CM | POA: Diagnosis not present

## 2015-11-22 DIAGNOSIS — Y998 Other external cause status: Secondary | ICD-10-CM | POA: Diagnosis not present

## 2015-11-22 DIAGNOSIS — R519 Headache, unspecified: Secondary | ICD-10-CM

## 2015-11-22 DIAGNOSIS — S199XXA Unspecified injury of neck, initial encounter: Secondary | ICD-10-CM | POA: Diagnosis not present

## 2015-11-22 DIAGNOSIS — Z8669 Personal history of other diseases of the nervous system and sense organs: Secondary | ICD-10-CM | POA: Insufficient documentation

## 2015-11-22 DIAGNOSIS — R569 Unspecified convulsions: Secondary | ICD-10-CM | POA: Diagnosis not present

## 2015-11-22 DIAGNOSIS — S0990XA Unspecified injury of head, initial encounter: Secondary | ICD-10-CM | POA: Insufficient documentation

## 2015-11-22 LAB — COMPREHENSIVE METABOLIC PANEL
ALT: 11 U/L — ABNORMAL LOW (ref 14–54)
AST: 22 U/L (ref 15–41)
Albumin: 3.8 g/dL (ref 3.5–5.0)
Alkaline Phosphatase: 49 U/L (ref 38–126)
Anion gap: 13 (ref 5–15)
BUN: 10 mg/dL (ref 6–20)
CO2: 21 mmol/L — ABNORMAL LOW (ref 22–32)
Calcium: 9.4 mg/dL (ref 8.9–10.3)
Chloride: 109 mmol/L (ref 101–111)
Creatinine, Ser: 0.76 mg/dL (ref 0.44–1.00)
GFR calc Af Amer: >60 mL/min (ref >60)
GFR calc non Af Amer: >60 mL/min (ref >60)
Glucose, Bld: 100 mg/dL — ABNORMAL HIGH (ref 65–99)
Potassium: 3.2 mmol/L — ABNORMAL LOW (ref 3.5–5.1)
Sodium: 143 mmol/L (ref 135–145)
Total Bilirubin: 0.5 mg/dL (ref 0.3–1.2)
Total Protein: 6.7 g/dL (ref 6.5–8.1)

## 2015-11-22 LAB — CBC WITH DIFFERENTIAL/PLATELET
Basophils Absolute: 0 10*3/uL (ref 0.0–0.1)
Basophils Relative: 0 %
Eosinophils Absolute: 0.1 10*3/uL (ref 0.0–0.7)
Eosinophils Relative: 1 %
HCT: 38.8 % (ref 36.0–46.0)
Hemoglobin: 13 g/dL (ref 12.0–15.0)
Lymphocytes Relative: 36 %
Lymphs Abs: 2.6 10*3/uL (ref 0.7–4.0)
MCH: 30.7 pg (ref 26.0–34.0)
MCHC: 33.5 g/dL (ref 30.0–36.0)
MCV: 91.5 fL (ref 78.0–100.0)
Monocytes Absolute: 0.5 10*3/uL (ref 0.1–1.0)
Monocytes Relative: 7 %
Neutro Abs: 4.1 10*3/uL (ref 1.7–7.7)
Neutrophils Relative %: 56 %
Platelets: 190 10*3/uL (ref 150–400)
RBC: 4.24 MIL/uL (ref 3.87–5.11)
RDW: 12.4 % (ref 11.5–15.5)
WBC: 7.3 10*3/uL (ref 4.0–10.5)

## 2015-11-22 LAB — I-STAT BETA HCG BLOOD, ED (MC, WL, AP ONLY): I-stat hCG, quantitative: <5 m[IU]/mL (ref <5)

## 2015-11-22 MED ORDER — LORAZEPAM 2 MG/ML IJ SOLN
1.0000 mg | Freq: Once | INTRAMUSCULAR | Status: AC
  Administered 2015-11-22: 1 mg via INTRAVENOUS
  Filled 2015-11-22: qty 1

## 2015-11-22 MED ORDER — MORPHINE SULFATE (PF) 4 MG/ML IV SOLN
4.0000 mg | Freq: Once | INTRAVENOUS | Status: DC
Start: 2015-11-22 — End: 2015-11-22

## 2015-11-22 NOTE — ED Notes (Signed)
Pt placed on monitor and into gown. Pt monitored by blood pressure, pulse ox, and 12 lead. Seizure pads also in place.
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
## 2015-12-06 ENCOUNTER — Encounter (HOSPITAL_COMMUNITY): Payer: Self-pay | Admitting: Emergency Medicine

## 2015-12-06 ENCOUNTER — Emergency Department (HOSPITAL_COMMUNITY)
Admission: EM | Admit: 2015-12-06 | Discharge: 2015-12-06 | Disposition: A | Discharge status: Home / Self Care | Payer: Medicaid Other | Attending: Emergency Medicine | Admitting: Emergency Medicine

## 2015-12-06 DIAGNOSIS — J45909 Unspecified asthma, uncomplicated: Secondary | ICD-10-CM | POA: Insufficient documentation

## 2015-12-06 DIAGNOSIS — Z8739 Personal history of other diseases of the musculoskeletal system and connective tissue: Secondary | ICD-10-CM | POA: Insufficient documentation

## 2015-12-06 DIAGNOSIS — R112 Nausea with vomiting, unspecified: Secondary | ICD-10-CM | POA: Diagnosis present

## 2015-12-06 DIAGNOSIS — Z87828 Personal history of other (healed) physical injury and trauma: Secondary | ICD-10-CM | POA: Insufficient documentation

## 2015-12-06 DIAGNOSIS — Z3202 Encounter for pregnancy test, result negative: Secondary | ICD-10-CM | POA: Diagnosis not present

## 2015-12-06 DIAGNOSIS — Z8679 Personal history of other diseases of the circulatory system: Secondary | ICD-10-CM | POA: Diagnosis not present

## 2015-12-06 LAB — CBC
HCT: 40.5 % (ref 36.0–46.0)
Hemoglobin: 14.1 g/dL (ref 12.0–15.0)
MCH: 31.3 pg (ref 26.0–34.0)
MCHC: 34.8 g/dL (ref 30.0–36.0)
MCV: 90 fL (ref 78.0–100.0)
Platelets: 191 10*3/uL (ref 150–400)
RBC: 4.5 MIL/uL (ref 3.87–5.11)
RDW: 12.4 % (ref 11.5–15.5)
WBC: 13.6 10*3/uL — ABNORMAL HIGH (ref 4.0–10.5)

## 2015-12-06 LAB — COMPREHENSIVE METABOLIC PANEL
ALT: 18 U/L (ref 14–54)
AST: 19 U/L (ref 15–41)
Albumin: 4.4 g/dL (ref 3.5–5.0)
Alkaline Phosphatase: 49 U/L (ref 38–126)
Anion gap: 9 (ref 5–15)
BUN: 15 mg/dL (ref 6–20)
CO2: 26 mmol/L (ref 22–32)
Calcium: 9.5 mg/dL (ref 8.9–10.3)
Chloride: 104 mmol/L (ref 101–111)
Creatinine, Ser: 0.61 mg/dL (ref 0.44–1.00)
GFR calc Af Amer: >60 mL/min (ref >60)
GFR calc non Af Amer: >60 mL/min (ref >60)
Glucose, Bld: 90 mg/dL (ref 65–99)
Potassium: 3.9 mmol/L (ref 3.5–5.1)
Sodium: 139 mmol/L (ref 135–145)
Total Bilirubin: 0.8 mg/dL (ref 0.3–1.2)
Total Protein: 8 g/dL (ref 6.5–8.1)

## 2015-12-06 LAB — LIPASE, BLOOD: Lipase: 31 U/L (ref 11–51)

## 2015-12-06 LAB — URINALYSIS, ROUTINE W REFLEX MICROSCOPIC
Glucose, UA: NEGATIVE mg/dL
Ketones, ur: 15 mg/dL — AB
Leukocytes, UA: NEGATIVE
Nitrite: NEGATIVE
Protein, ur: NEGATIVE mg/dL
Specific Gravity, Urine: 1.035 — ABNORMAL HIGH (ref 1.005–1.030)
pH: 5.5 (ref 5.0–8.0)

## 2015-12-06 LAB — I-STAT BETA HCG BLOOD, ED (MC, WL, AP ONLY): I-stat hCG, quantitative: <5 m[IU]/mL (ref <5)

## 2015-12-06 LAB — URINE MICROSCOPIC-ADD ON

## 2015-12-06 MED ORDER — ONDANSETRON HCL 4 MG/2ML IJ SOLN
4.0000 mg | Freq: Once | INTRAMUSCULAR | Status: AC
Start: 2015-12-06 — End: 2015-12-06
  Administered 2015-12-06: 4 mg via INTRAVENOUS
  Filled 2015-12-06: qty 2

## 2015-12-06 MED ORDER — ONDANSETRON HCL 4 MG PO TABS
4.0000 mg | ORAL_TABLET | Freq: Four times a day (QID) | ORAL | Status: DC
Start: 2015-12-06 — End: 2017-07-26

## 2015-12-06 MED ORDER — SODIUM CHLORIDE 0.9 % IV BOLUS (SEPSIS)
1000.0000 mL | Freq: Once | INTRAVENOUS | Status: AC
  Administered 2015-12-06: 1000 mL via INTRAVENOUS

## 2015-12-06 NOTE — Discharge Instructions (Signed)
Ms. Erin Price,  Nice meeting you! Please follow-up with your primary care provider. Return to the emergency department if you develop increased abdominal pain, inability to keep foods down, fevers, chills. Feel better soon!  S. Lane HackerNicole Kamaury Cutbirth, PA-C Nausea and Vomiting Nausea means you feel sick to your stomach. Throwing up (vomiting) is a reflex where stomach contents come out of your mouth. HOME CARE   Take medicine as told by your doctor.  Do not force yourself to eat. However, you do need to drink fluids.  If you feel like eating, eat a normal diet as told by your doctor.  Eat rice, wheat, potatoes, bread, lean meats, yogurt, fruits, and vegetables.  Avoid high-fat foods.  Drink enough fluids to keep your pee (urine) clear or pale yellow.  Ask your doctor how to replace body fluid losses (rehydrate). Signs of body fluid loss (dehydration) include:  Feeling very thirsty.  Dry lips and mouth.  Feeling dizzy.  Dark pee.  Peeing less than normal.  Feeling confused.  Fast breathing or heart rate. GET HELP RIGHT AWAY IF:   You have blood in your throw up.  You have black or bloody poop (stool).  You have a bad headache or stiff neck.  You feel confused.  You have bad belly (abdominal) pain.  You have chest pain or trouble breathing.  You do not pee at least once every 8 hours.  You have cold, clammy skin.  You keep throwing up after 24 to 48 hours.  You have a fever. MAKE SURE YOU:   Understand these instructions.  Will watch your condition.  Will get help right away if you are not doing well or get worse.   This information is not intended to replace advice given to you by your health care provider. Make sure you discuss any questions you have with your health care provider.   Document Released: 02/26/2008 Document Revised: 12/02/2011 Document Reviewed: 02/08/2011 Elsevier Interactive Patient Education [employer]2016 [employer]

## 2015-12-06 NOTE — ED Notes (Signed)
Per pt, states vomiting since yesterday-has been around friends who have been sick

## 2015-12-06 NOTE — ED Notes (Signed)
PA at bedside.

## 2015-12-06 NOTE — ED Provider Notes (Signed)
CSN: [id number]     Arrival date & time 12/06/15  1831 History   First MD Initiated Contact with Patient 12/06/15 1942     Chief Complaint  Patient presents with  . Emesis   HPI  Erin Price is a 25 y.o. female PMH significant for seizures, asthma, migraines presenting with a 1 day history of nausea and vomiting. She endorses ill contacts. She denies fevers, chills, raw/undercooked foods, HA, CP, SOB, abdominal pain, diarrhea.   Past Medical History  Diagnosis Date  . Seizures (HCC)   . Closed head injury   . Scoliosis   . Asthma   . Migraine    Past Surgical History  Procedure Laterality Date  . Back surgery      screws placed for scoliosis   Family History  Problem Relation Age of Onset  . Anesthesia problems Neg Hx   . Hypertension Father   . Diabetes Maternal Grandmother   . Hypertension Maternal Grandmother   . Diabetes Paternal Grandmother   . Hypertension Paternal Grandmother    Social History  Substance Use Topics  . Smoking status: Never Smoker   . Smokeless tobacco: Never Used  . Alcohol Use: No   OB History    Gravida Para Term Preterm AB TAB SAB Ectopic Multiple Living   Review of Systems  Ten systems are reviewed and are negative for acute change except as noted in the HPI  Allergies  Review of patient's allergies indicates no known allergies.  Home Medications   Prior to Admission medications   Medication Sig Start Date End Date Taking? Authorizing Provider  selenium sulfide (SELSUN) 2.5 % shampoo Apply 1 application topically daily as needed for irritation. Patient not taking: Reported on 12/06/2015 09/06/15   Satira Sark Nadeau, PA-C   BP 113/75 mmHg  Pulse 98  Temp(Src) 98.5 F (36.9 C) (Oral)  Resp 20  SpO2 96%  LMP 11/22/2015 Physical Exam  Constitutional: She appears well-developed and well-nourished. No distress.  HENT:  Head: Normocephalic and atraumatic.  Mouth/Throat: Oropharynx is clear and moist.  No oropharyngeal exudate.  Eyes: Conjunctivae are normal. Pupils are equal, round, and reactive to light. Right eye exhibits no discharge. Left eye exhibits no discharge. No scleral icterus.  Neck: No tracheal deviation present.  Cardiovascular: Normal rate, regular rhythm, normal heart sounds and intact distal pulses.  Exam reveals no gallop and no friction rub.   No murmur heard. Pulmonary/Chest: Effort normal and breath sounds normal. No respiratory distress. She has no wheezes. She has no rales. She exhibits no tenderness.  Abdominal: Soft. Bowel sounds are normal. She exhibits no distension and no mass. There is no tenderness. There is no rebound and no guarding.  Musculoskeletal: She exhibits no edema.  Lymphadenopathy:    She has no cervical adenopathy.  Neurological: She is alert. Coordination normal.  Skin: Skin is warm and dry. No rash noted. She is not diaphoretic. No erythema.  Psychiatric: She has a normal mood and affect. Her behavior is normal.  Nursing note and vitals reviewed.   ED Course  Procedures (including critical care time) Labs Review Labs Reviewed  CBC - Abnormal; Notable for the following:    WBC 13.6 (*)    All other components within normal limits  URINALYSIS, ROUTINE W REFLEX MICROSCOPIC (NOT AT [facility]) - Abnormal; Notable for the following:    Color, Urine AMBER (*)    APPearance CLOUDY (*)  Specific Gravity, Urine 1.035 (*)    Hgb urine dipstick MODERATE (*)    Bilirubin Urine SMALL (*)    Ketones, ur 15 (*)    All other components within normal limits  URINE MICROSCOPIC-ADD ON - Abnormal; Notable for the following:    Squamous Epithelial / LPF 0-5 (*)    Bacteria, UA RARE (*)    All other components within normal limits  LIPASE, BLOOD  COMPREHENSIVE METABOLIC PANEL  I-STAT BETA HCG BLOOD, ED ([facility], [facility], [facility] ONLY)   MDM   Final diagnoses:  Nausea and vomiting, vomiting of unspecified type   Patient non-toxic appearing and VSS. Based on patient  history and physical exam, most likely etiologies are gastroenteritis. Less likely etiologies are obstruction, appendicitis, cholecystitis, pyelonephritis, kidney stones, food poisoning, gastritis/ulcers, ischemia, perforation, torsion, alcohol intoxication or withdrawal, other drugs/toxins, vertigo (cerebellar, vertebrobasilar, vestibular), meningitis, increased intracranial pressure, intracranial bleed, migraine, tumors, systemic infection, dehydration, hypo/hyperglycemia, hyponatremia, acidosis (DKA, AKA), cardiac ischemia, pregnancy.   CBC with nonspecific leukocytosis of 13.6. UA with moderate hgb, 15 ketones.  Lipase, CMP, hcg unremarkable.   Medications  sodium chloride 0.9 % bolus 1,000 mL (0 mLs Intravenous Stopped 12/06/15 2116)  ondansetron (ZOFRAN) injection 4 mg (4 mg Intravenous Given 12/06/15 1959)   Encouraged urology follow-up as patient has had hematuria for a few years without further evaluation. Patient not on menses at this time.  Patient feels improved after observation and/or treatment in ED.  Patient may be safely discharged home with  Discharge Medication List as of 12/06/2015  8:52 PM    START taking these medications   Details  ondansetron (ZOFRAN) 4 MG tablet Take 1 tablet (4 mg total) by mouth every 6 (six) hours., Starting 12/06/2015, Until Discontinued, Print       Discussed reasons for return. Patient to follow-up with primary care provider within one week and urology. Patient in understanding and agreement with the plan.   Melton KrebsSamantha Nicole Hadiyah Maricle, PA-C 12/10/15 2302  Pricilla LovelessScott Goldston, MD 12/11/15 [phone]

## 2015-12-06 NOTE — ED Notes (Signed)
Discharge instructions, follow up care, and rx x1 reviewed with patient. Patient verbalized understanding. 

## 2016-01-06 ENCOUNTER — Emergency Department (HOSPITAL_COMMUNITY): Payer: Medicaid Other

## 2016-01-06 ENCOUNTER — Emergency Department (HOSPITAL_COMMUNITY)
Admission: EM | Admit: 2016-01-06 | Discharge: 2016-01-06 | Disposition: A | Discharge status: Home / Self Care | Payer: Medicaid Other | Attending: Emergency Medicine | Admitting: Emergency Medicine

## 2016-01-06 ENCOUNTER — Encounter (HOSPITAL_COMMUNITY): Payer: Self-pay

## 2016-01-06 DIAGNOSIS — R569 Unspecified convulsions: Secondary | ICD-10-CM | POA: Diagnosis not present

## 2016-01-06 DIAGNOSIS — Y998 Other external cause status: Secondary | ICD-10-CM | POA: Insufficient documentation

## 2016-01-06 DIAGNOSIS — J45909 Unspecified asthma, uncomplicated: Secondary | ICD-10-CM | POA: Insufficient documentation

## 2016-01-06 DIAGNOSIS — S29001A Unspecified injury of muscle and tendon of front wall of thorax, initial encounter: Secondary | ICD-10-CM | POA: Diagnosis not present

## 2016-01-06 DIAGNOSIS — Y92009 Unspecified place in unspecified non-institutional (private) residence as the place of occurrence of the external cause: Secondary | ICD-10-CM | POA: Diagnosis not present

## 2016-01-06 DIAGNOSIS — S0181XA Laceration without foreign body of other part of head, initial encounter: Secondary | ICD-10-CM | POA: Insufficient documentation

## 2016-01-06 DIAGNOSIS — Z8679 Personal history of other diseases of the circulatory system: Secondary | ICD-10-CM | POA: Diagnosis not present

## 2016-01-06 DIAGNOSIS — W108XXA Fall (on) (from) other stairs and steps, initial encounter: Secondary | ICD-10-CM | POA: Insufficient documentation

## 2016-01-06 DIAGNOSIS — Y9389 Activity, other specified: Secondary | ICD-10-CM | POA: Diagnosis not present

## 2016-01-06 DIAGNOSIS — S90811A Abrasion, right foot, initial encounter: Secondary | ICD-10-CM | POA: Insufficient documentation

## 2016-01-06 DIAGNOSIS — S0990XA Unspecified injury of head, initial encounter: Secondary | ICD-10-CM | POA: Diagnosis present

## 2016-01-06 DIAGNOSIS — S199XXA Unspecified injury of neck, initial encounter: Secondary | ICD-10-CM | POA: Diagnosis not present

## 2016-01-06 DIAGNOSIS — Z3202 Encounter for pregnancy test, result negative: Secondary | ICD-10-CM | POA: Insufficient documentation

## 2016-01-06 DIAGNOSIS — Z8739 Personal history of other diseases of the musculoskeletal system and connective tissue: Secondary | ICD-10-CM | POA: Insufficient documentation

## 2016-01-06 LAB — I-STAT BETA HCG BLOOD, ED (MC, WL, AP ONLY): I-stat hCG, quantitative: <5 m[IU]/mL (ref <5)

## 2016-01-06 MED ORDER — SODIUM BICARBONATE 4 % IV SOLN
5.0000 mL | Freq: Once | INTRAVENOUS | Status: AC
  Administered 2016-01-06: 5 mL via SUBCUTANEOUS
  Filled 2016-01-06: qty 5

## 2016-01-06 MED ORDER — FENTANYL CITRATE (PF) 100 MCG/2ML IJ SOLN
50.0000 ug | Freq: Once | INTRAMUSCULAR | Status: AC
  Administered 2016-01-06: 50 ug via INTRAVENOUS
  Filled 2016-01-06: qty 2

## 2016-01-06 MED ORDER — TETANUS-DIPHTH-ACELL PERTUSSIS 5-2.5-18.5 LF-MCG/0.5 IM SUSP: 0.5000 mL | Freq: Once | INTRAMUSCULAR | Status: DC

## 2016-01-06 MED ORDER — LIDOCAINE-EPINEPHRINE (PF) 2 %-1:200000 IJ SOLN
INTRAMUSCULAR | Status: AC
  Administered 2016-01-06: 10 mL
  Filled 2016-01-06: qty 20

## 2016-01-06 MED ORDER — LIDOCAINE-EPINEPHRINE (PF) 2 %-1:200000 IJ SOLN
10.0000 mL | Freq: Once | INTRAMUSCULAR | Status: AC
  Administered 2016-01-06: 10 mL

## 2016-01-06 NOTE — ED Notes (Signed)
Pt reports falling down stairs after twisting ankle. Laceration to forehead. Pt c/o pain in ankle and head. Hx of non-epileptic seizures.
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
## 2016-01-06 NOTE — Discharge Instructions (Signed)

## 2016-01-06 NOTE — ED Provider Notes (Signed)
CSN: [id number]     Arrival date & time 01/06/16  0203 History   First MD Initiated Contact with Patient 01/06/16 0210     Chief Complaint  Patient presents with  . Head Injury     (Consider location/radiation/quality/duration/timing/severity/associated sxs/prior Treatment) The history is provided by the patient and medical records. No language interpreter was used.    Alianah Lofton is a 25 y.o. female  with a hx of Nonepileptic seizures, closed head injury, asthma, migraine, scoliosis presents to the Emergency Department complaining of acute, persistent laceration to the forehead with associated severe headache after fall. Patient reports she had several drinks tonight including one beer and a shot of tequila. She was heading home and attempted to walk up the stairs in her house and tripped, falling down approximately 5 or 6 stairs. Patient's boyfriend is at bedside and reports no loss of consciousness or seizure activity. No vomiting prior to arrival. Patient reports she does not remember the car ride over to the hospital.  She reports she does not take any medications for her seizures and usually only has them when she has a migraine. Patient reports last seizure was approximately one month ago.     Past Medical History  Diagnosis Date  . Seizures (HCC)   . Closed head injury   . Scoliosis   . Asthma   . Migraine    Past Surgical History  Procedure Laterality Date  . Back surgery      screws placed for scoliosis   Family History  Problem Relation Age of Onset  . Anesthesia problems Neg Hx   . Hypertension Father   . Diabetes Maternal Grandmother   . Hypertension Maternal Grandmother   . Diabetes Paternal Grandmother   . Hypertension Paternal Grandmother    Social History  Substance Use Topics  . Smoking status: Never Smoker   . Smokeless tobacco: Never Used  . Alcohol Use: No   OB History    Gravida Para Term Preterm AB TAB SAB Ectopic Multiple Living   2 2 1 1       2      Review of Systems  Constitutional: Negative for fever and chills.  HENT: Negative for dental problem, facial swelling and nosebleeds.   Eyes: Negative for visual disturbance.  Respiratory: Negative for cough, chest tightness, shortness of breath, wheezing and stridor.   Cardiovascular: Negative for chest pain.  Gastrointestinal: Negative for nausea, vomiting and abdominal pain.  Genitourinary: Negative for dysuria, hematuria and flank pain.  Musculoskeletal: Positive for neck pain. Negative for back pain, joint swelling, arthralgias, gait problem and neck stiffness.  Skin: Positive for wound. Negative for rash.  Neurological: Positive for seizures and headaches. Negative for syncope, weakness, light-headedness and numbness.  Hematological: Does not bruise/bleed easily.  Psychiatric/Behavioral: The patient is not nervous/anxious.   All other systems reviewed and are negative.     Allergies  Review of patient's allergies indicates no known allergies.  Home Medications   Prior to Admission medications   Medication Sig Start Date End Date Taking? Authorizing Provider  ondansetron (ZOFRAN) 4 MG tablet Take 1 tablet (4 mg total) by mouth every 6 (six) hours. Patient not taking: Reported on 01/06/2016 12/06/15   Melton Krebs, PA-C  selenium sulfide (SELSUN) 2.5 % shampoo Apply 1 application topically daily as needed for irritation. Patient not taking: Reported on 12/06/2015 09/06/15   Satira Sark Nadeau, PA-C   BP 116/79 mmHg  Pulse 72  Temp(Src) 98.2 F (36.8 C) (  Oral)  Resp 19  SpO2 99%  LMP 12/23/2015 Physical Exam  Constitutional: She is oriented to person, place, and time. She appears well-developed and well-nourished. No distress.  HENT:  Head: Normocephalic. Head is with laceration.    Nose: Nose normal.  Mouth/Throat: Uvula is midline, oropharynx is clear and moist and mucous membranes are normal.  3 cm linear laceration to the right forehead with  associated contusion  Eyes: Conjunctivae and EOM are normal. Pupils are equal, round, and reactive to light.  Neck: No spinous process tenderness and no muscular tenderness present. No rigidity. Normal range of motion present.  C-collar placed immediately in the ED Mild midline and paraspinal cervical tenderness No crepitus, deformity or step-offs  Cardiovascular: Normal rate, regular rhythm, normal heart sounds and intact distal pulses.   No murmur heard. Pulses:      Radial pulses are 2+ on the right side, and 2+ on the left side.       Dorsalis pedis pulses are 2+ on the right side, and 2+ on the left side.       Posterior tibial pulses are 2+ on the right side, and 2+ on the left side.  Pulmonary/Chest: Effort normal and breath sounds normal. No accessory muscle usage. No respiratory distress. She has no decreased breath sounds. She has no wheezes. She has no rhonchi. She has no rales. She exhibits no tenderness and no bony tenderness.  No large contusions Tenderness to palpation over the left lower ribs No flail segment, crepitus or deformity Equal chest expansion Clear and equal breath sounds  Abdominal: Soft. Normal appearance and bowel sounds are normal. There is no tenderness. There is no rigidity, no guarding and no CVA tenderness.  No contusions or lacerations Abd soft and nontender  Musculoskeletal: Normal range of motion. She exhibits no edema.       Thoracic back: She exhibits normal range of motion.       Lumbar back: She exhibits normal range of motion.  No tenderness to palpation of the spinous processes of the T-spine or L-spine No crepitus, deformity or step-offs No tenderness to palpation of the paraspinous muscles of the L-spine Full range of motion of all major joints in the upper and lower extremities  Lymphadenopathy:    She has no cervical adenopathy.  Neurological: She is alert and oriented to person, place, and time. She has normal reflexes. No cranial nerve  deficit. GCS eye subscore is 4. GCS verbal subscore is 5. GCS motor subscore is 6.  Reflex Scores:      Bicep reflexes are 2+ on the right side and 2+ on the left side.      Brachioradialis reflexes are 2+ on the right side and 2+ on the left side.      Patellar reflexes are 2+ on the right side and 2+ on the left side.      Achilles reflexes are 2+ on the right side and 2+ on the left side. Speech is clear and goal oriented, follows commands Normal 5/5 strength in upper and lower extremities bilaterally including dorsiflexion and plantar flexion, strong and equal grip strength Sensation normal to light and sharp touch Moves extremities without ataxia, coordination intact Gait testing deferred No Clonus  Skin: Skin is warm and dry. No rash noted. She is not diaphoretic. No erythema.  Small abrasion to the right lateral foot  Psychiatric: She has a normal mood and affect.  Nursing note and vitals reviewed.   ED Course  .Marland Kitchen.Laceration  Repair Date/Time: 01/06/2016 4:36 AM Performed by: Dierdre Forth Authorized by: Dierdre Forth Consent: Verbal consent obtained. Risks and benefits: risks, benefits and alternatives were discussed Consent given by: patient Patient understanding: patient states understanding of the procedure being performed Patient consent: the patient's understanding of the procedure matches consent given Procedure consent: procedure consent matches procedure scheduled Relevant documents: relevant documents present and verified Site marked: the operative site was marked Required items: required blood products, implants, devices, and special equipment available Patient identity confirmed: verbally with patient and arm band Time out: Immediately prior to procedure a "time out" was called to verify the correct patient, procedure, equipment, support staff and site/side marked as required. Body area: head/neck Location details: forehead Laceration length: 3  cm Foreign bodies: no foreign bodies Tendon involvement: none Nerve involvement: none Vascular damage: no Local anesthetic: lidocaine 2% with epinephrine Anesthetic total: 2.5 ml Patient sedated: no Preparation: Patient was prepped and draped in the usual sterile fashion. Irrigation solution: saline Irrigation method: syringe Amount of cleaning: extensive Skin closure: 6-0 Prolene Number of sutures: 4 Technique: running Approximation: close Dressing: 4x4 sterile gauze Patient tolerance: Patient tolerated the procedure well with no immediate complications   (including critical care time) Labs Review Labs Reviewed  CBG MONITORING, ED  I-STAT BETA HCG BLOOD, ED ([facility], [facility], [facility] ONLY)    Imaging Review Dg Ribs Unilateral W/chest Left  01/06/2016  CLINICAL DATA:  Status post fall down stairs, with left lateral rib pain. Initial encounter. EXAM: LEFT RIBS AND CHEST - 3+ VIEW COMPARISON:  Left rib radiographs performed 04/28/2014 FINDINGS: No displaced rib fractures are seen. The lungs are well-aerated and clear. There is no evidence of focal opacification, pleural effusion or pneumothorax. The cardiomediastinal silhouette is within normal limits. No acute osseous abnormalities are seen. Lumbar spinal fusion hardware is noted. IMPRESSION: No displaced rib fracture seen. No acute cardiopulmonary process identified. Electronically Signed   By: Roanna Raider M.D.   On: 01/06/2016 02:43   Ct Head Wo Contrast  01/06/2016  CLINICAL DATA:  Larey Seat downstairs after twisting ankle. EXAM: CT HEAD WITHOUT CONTRAST CT MAXILLOFACIAL WITHOUT CONTRAST CT CERVICAL SPINE WITHOUT CONTRAST TECHNIQUE: Multidetector CT imaging of the head, cervical spine, and maxillofacial structures were performed using the standard protocol without intravenous contrast. Multiplanar CT image reconstructions of the cervical spine and maxillofacial structures were also generated. COMPARISON:  11/22/2015 FINDINGS: CT HEAD FINDINGS There  is no intracranial hemorrhage, mass or evidence of acute infarction. There is no extra-axial fluid collection. Gray matter and white matter appear normal. Cerebral volume is normal for age. Brainstem and posterior fossa are unremarkable. The CSF spaces appear normal. The bony structures are intact. The visible portions of the paranasal sinuses are clear. CT MAXILLOFACIAL FINDINGS The nasal bones are intact. Bony orbits are intact. Orbital contents are intact. Maxillary sinuses are intact. Zygomatic arches and pterygoid plates are intact. Mandible and TMJ are intact. No acute soft tissue abnormalities are evident. CT CERVICAL SPINE FINDINGS The vertebral column, pedicles and facet articulations are intact. There is no evidence of acute fracture. No acute soft tissue abnormalities are evident. No significant arthritic changes are evident. IMPRESSION: 1. Negative for acute intracranial traumatic injury.  Normal brain. 2. Negative for acute maxillofacial fracture. 3. Negative for acute cervical spine fracture. Electronically Signed   By: Ellery Plunk M.D.   On: 01/06/2016 02:56   Ct Cervical Spine Wo Contrast  01/06/2016  CLINICAL DATA:  Larey Seat downstairs after twisting ankle. EXAM: CT HEAD WITHOUT CONTRAST  CT MAXILLOFACIAL WITHOUT CONTRAST CT CERVICAL SPINE WITHOUT CONTRAST TECHNIQUE: Multidetector CT imaging of the head, cervical spine, and maxillofacial structures were performed using the standard protocol without intravenous contrast. Multiplanar CT image reconstructions of the cervical spine and maxillofacial structures were also generated. COMPARISON:  11/22/2015 FINDINGS: CT HEAD FINDINGS There is no intracranial hemorrhage, mass or evidence of acute infarction. There is no extra-axial fluid collection. Gray matter and white matter appear normal. Cerebral volume is normal for age. Brainstem and posterior fossa are unremarkable. The CSF spaces appear normal. The bony structures are intact. The visible  portions of the paranasal sinuses are clear. CT MAXILLOFACIAL FINDINGS The nasal bones are intact. Bony orbits are intact. Orbital contents are intact. Maxillary sinuses are intact. Zygomatic arches and pterygoid plates are intact. Mandible and TMJ are intact. No acute soft tissue abnormalities are evident. CT CERVICAL SPINE FINDINGS The vertebral column, pedicles and facet articulations are intact. There is no evidence of acute fracture. No acute soft tissue abnormalities are evident. No significant arthritic changes are evident. IMPRESSION: 1. Negative for acute intracranial traumatic injury.  Normal brain. 2. Negative for acute maxillofacial fracture. 3. Negative for acute cervical spine fracture. Electronically Signed   By: Ellery Plunk M.D.   On: 01/06/2016 02:56   Ct Maxillofacial Wo Cm  01/06/2016  CLINICAL DATA:  Larey Seat downstairs after twisting ankle. EXAM: CT HEAD WITHOUT CONTRAST CT MAXILLOFACIAL WITHOUT CONTRAST CT CERVICAL SPINE WITHOUT CONTRAST TECHNIQUE: Multidetector CT imaging of the head, cervical spine, and maxillofacial structures were performed using the standard protocol without intravenous contrast. Multiplanar CT image reconstructions of the cervical spine and maxillofacial structures were also generated. COMPARISON:  11/22/2015 FINDINGS: CT HEAD FINDINGS There is no intracranial hemorrhage, mass or evidence of acute infarction. There is no extra-axial fluid collection. Gray matter and white matter appear normal. Cerebral volume is normal for age. Brainstem and posterior fossa are unremarkable. The CSF spaces appear normal. The bony structures are intact. The visible portions of the paranasal sinuses are clear. CT MAXILLOFACIAL FINDINGS The nasal bones are intact. Bony orbits are intact. Orbital contents are intact. Maxillary sinuses are intact. Zygomatic arches and pterygoid plates are intact. Mandible and TMJ are intact. No acute soft tissue abnormalities are evident. CT CERVICAL  SPINE FINDINGS The vertebral column, pedicles and facet articulations are intact. There is no evidence of acute fracture. No acute soft tissue abnormalities are evident. No significant arthritic changes are evident. IMPRESSION: 1. Negative for acute intracranial traumatic injury.  Normal brain. 2. Negative for acute maxillofacial fracture. 3. Negative for acute cervical spine fracture. Electronically Signed   By: Ellery Plunk M.D.   On: 01/06/2016 02:56   I have personally reviewed and evaluated these images and lab results as part of my medical decision-making.    MDM   Final diagnoses:  Fall down stairs, initial encounter  Forehead laceration, initial encounter   Leahmarie Gasiorowski presents after fall down several steps with a laceration to the right forehead.  Upon arrival, pt with witnessed nonepileptic seizure without a postictal period.  Imaging without acute abnormality.  C-collar removed and pt with FROM of the c-spine.  She is ambulatory without assistance in the ED.  Pressure irrigation performed. Wound explored and base of wound visualized in a bloodless field without evidence of foreign body.  Laceration occurred < 8 hours prior to repair which was well tolerated.  Tdap updated.  Pt has no comorbidities to effect normal wound healing. Pt discharged  without antibiotics.  Discussed  suture home care with patient and answered questions. Pt to follow-up for wound check and suture removal in 7 days; they are to return to the ED sooner for signs of infection. Pt is hemodynamically stable with no complaints prior to dc.    Dahlia Client Calogero Geisen, PA-C 01/06/16 [phone]

## 2016-01-06 NOTE — ED Notes (Signed)
CBG 106 

## 2016-01-08 LAB — CBG MONITORING, ED: Glucose-Capillary: 106 mg/dL — ABNORMAL HIGH (ref 65–99)

## 2016-01-13 ENCOUNTER — Emergency Department (HOSPITAL_COMMUNITY)
Admission: EM | Admit: 2016-01-13 | Discharge: 2016-01-13 | Disposition: A | Discharge status: Home / Self Care | Payer: Medicaid Other | Attending: Emergency Medicine | Admitting: Emergency Medicine

## 2016-01-13 ENCOUNTER — Encounter (HOSPITAL_COMMUNITY): Payer: Self-pay | Admitting: *Deleted

## 2016-01-13 DIAGNOSIS — Z4802 Encounter for removal of sutures: Secondary | ICD-10-CM

## 2016-01-13 DIAGNOSIS — Z87828 Personal history of other (healed) physical injury and trauma: Secondary | ICD-10-CM | POA: Insufficient documentation

## 2016-01-13 DIAGNOSIS — Z8679 Personal history of other diseases of the circulatory system: Secondary | ICD-10-CM | POA: Diagnosis not present

## 2016-01-13 DIAGNOSIS — Z8739 Personal history of other diseases of the musculoskeletal system and connective tissue: Secondary | ICD-10-CM | POA: Diagnosis not present

## 2016-01-13 DIAGNOSIS — J45909 Unspecified asthma, uncomplicated: Secondary | ICD-10-CM | POA: Diagnosis not present

## 2016-01-13 NOTE — ED Provider Notes (Signed)
CSN: [id number]     Arrival date & time 01/13/16  1431 History  By signing my name below, I, Erin Price, attest that this documentation has been prepared under the direction and in the presence of [name], PA-C Electronically Signed: Soijett Price, ED Scribe. 01/13/2016. 2:48 PM.   Chief Complaint  Patient presents with  . Suture / Staple Removal     The history is provided by the patient. No language interpreter was used.    Erin Price is a 25 y.o. female who presents to the Emergency Department complaining of suture removal onset today. Pt had the sutures placed to her forehead due to falling while going up stairs to the bathroom. Pt reports that she was intoxicated at the time. Pt states that the area is pruritic and that there is throbbing pain to the area when she bends over. She states that she has tried symptomatic treatment for the relief of her symptoms. Denies drainage, fever, chills, and any other symptoms   Per pt chart review: Pt was seen in the ED on 01/06/16 for head laceration due to falling with four sutures applied to the area. Pt had CT head, CT C-spine, CT maxillofacial, and left rib xray all return nl. Pt was not Rx any medications for their symptoms and informed to follow up in 7 days for suture removal.   Past Medical History  Diagnosis Date  . Seizures (HCC)   . Closed head injury   . Scoliosis   . Asthma   . Migraine    Past Surgical History  Procedure Laterality Date  . Back surgery      screws placed for scoliosis   Family History  Problem Relation Age of Onset  . Anesthesia problems Neg Hx   . Hypertension Father   . Diabetes Maternal Grandmother   . Hypertension Maternal Grandmother   . Diabetes Paternal Grandmother   . Hypertension Paternal Grandmother    Social History  Substance Use Topics  . Smoking status: Never Smoker   . Smokeless tobacco: Never Used  . Alcohol Use: No   OB History    Gravida Para Term Preterm AB TAB SAB  Ectopic Multiple Living   2 2 1 1      2      Review of Systems  Constitutional: Negative for fever and chills.  Skin: Negative for color change and wound.       4 healing sutures in place to forehead without drainage      Allergies  Review of patient's allergies indicates no known allergies.  Home Medications   Prior to Admission medications   Medication Sig Start Date End Date Taking? Authorizing Provider  ondansetron (ZOFRAN) 4 MG tablet Take 1 tablet (4 mg total) by mouth every 6 (six) hours. Patient not taking: Reported on 01/06/2016 12/06/15   Melton KrebsSamantha Nicole Riley, PA-C  selenium sulfide (SELSUN) 2.5 % shampoo Apply 1 application topically daily as needed for irritation. Patient not taking: Reported on 12/06/2015 09/06/15   Satira SarkNicole Elizabeth Nadeau, PA-C   BP 108/64 mmHg  Pulse 61  Temp(Src) 97.9 F (36.6 C)  Resp 16  SpO2 100%  LMP 12/23/2015 Physical Exam  Constitutional: She is oriented to person, place, and time. She appears well-developed and well-nourished. No distress.  HENT:  Head: Normocephalic and atraumatic.  Eyes: EOM are normal.  Neck: Neck supple.  Cardiovascular: Normal rate.   Pulmonary/Chest: Effort normal. No respiratory distress.  Abdominal: She exhibits no distension.  Musculoskeletal: Normal range  of motion.  Neurological: She is alert and oriented to person, place, and time.  Skin: Skin is warm and dry.  Well healing 2 cm vertical laceration noted to right forehead without any signs of infections. Sutures in place.  Psychiatric: She has a normal mood and affect. Her behavior is normal.  Nursing note and vitals reviewed.   ED Course  Procedures (including critical care time) DIAGNOSTIC STUDIES: Oxygen Saturation is 100% on RA, nl by my interpretation.    COORDINATION OF CARE: 2:47 PM Discussed treatment plan with pt at bedside which includes suture removal and pt agreed to plan.   SUTURE REMOVAL Performed by: Fayrene Helper  Consent:  Verbal consent obtained. Patient identity confirmed: provided demographic data Time out: Immediately prior to procedure a "time out" was called to verify the correct patient, procedure, equipment, support staff and site/side marked as required.  Location details: R forehead  Wound Appearance: clean  Sutures/Staples Removed: sutures  Facility: sutures placed in this facility Patient tolerance: Patient tolerated the procedure well with no immediate complications.     MDM   Final diagnoses:  Encounter for removal of sutures    Suture removal   Pt to ER for suture removal and wound check as above. Procedure tolerated well. Vitals normal, no signs of infection. Scar minimization & return precautions given at discharge.    I personally performed the services described in this documentation, which was scribed in my presence. The recorded information has been reviewed and is accurate.      Fayrene Helper, PA-C 01/14/16 1003  Rolland Porter, MD 01/26/16 [phone]

## 2016-01-13 NOTE — ED Notes (Signed)
Pt here to have stiches removed from forehead lac

## 2016-01-13 NOTE — Discharge Instructions (Signed)

## 2016-02-05 ENCOUNTER — Encounter (HOSPITAL_COMMUNITY): Payer: Self-pay | Admitting: Emergency Medicine

## 2016-02-05 ENCOUNTER — Emergency Department (HOSPITAL_COMMUNITY): Payer: Medicaid Other

## 2016-02-05 ENCOUNTER — Emergency Department (HOSPITAL_COMMUNITY)
Admission: EM | Admit: 2016-02-05 | Discharge: 2016-02-05 | Discharge status: Against Medical Advice | Payer: Medicaid Other | Attending: Emergency Medicine | Admitting: Emergency Medicine

## 2016-02-05 DIAGNOSIS — Z792 Long term (current) use of antibiotics: Secondary | ICD-10-CM | POA: Diagnosis not present

## 2016-02-05 DIAGNOSIS — R1031 Right lower quadrant pain: Secondary | ICD-10-CM | POA: Diagnosis not present

## 2016-02-05 DIAGNOSIS — Z79899 Other long term (current) drug therapy: Secondary | ICD-10-CM | POA: Insufficient documentation

## 2016-02-05 DIAGNOSIS — J45909 Unspecified asthma, uncomplicated: Secondary | ICD-10-CM | POA: Insufficient documentation

## 2016-02-05 LAB — COMPREHENSIVE METABOLIC PANEL
ALT: 25 U/L (ref 14–54)
AST: 33 U/L (ref 15–41)
Albumin: 4.7 g/dL (ref 3.5–5.0)
Alkaline Phosphatase: 60 U/L (ref 38–126)
Anion gap: 8 (ref 5–15)
BUN: 11 mg/dL (ref 6–20)
CO2: 26 mmol/L (ref 22–32)
Calcium: 9.6 mg/dL (ref 8.9–10.3)
Chloride: 105 mmol/L (ref 101–111)
Creatinine, Ser: 0.63 mg/dL (ref 0.44–1.00)
GFR calc Af Amer: >60 mL/min (ref >60)
GFR calc non Af Amer: >60 mL/min (ref >60)
Glucose, Bld: 99 mg/dL (ref 65–99)
Potassium: 3.8 mmol/L (ref 3.5–5.1)
Sodium: 139 mmol/L (ref 135–145)
Total Bilirubin: 1 mg/dL (ref 0.3–1.2)
Total Protein: 7.9 g/dL (ref 6.5–8.1)

## 2016-02-05 LAB — CBC
HCT: 40.2 % (ref 36.0–46.0)
Hemoglobin: 14 g/dL (ref 12.0–15.0)
MCH: 31.5 pg (ref 26.0–34.0)
MCHC: 34.8 g/dL (ref 30.0–36.0)
MCV: 90.3 fL (ref 78.0–100.0)
Platelets: 227 10*3/uL (ref 150–400)
RBC: 4.45 MIL/uL (ref 3.87–5.11)
RDW: 12.6 % (ref 11.5–15.5)
WBC: 9.8 10*3/uL (ref 4.0–10.5)

## 2016-02-05 LAB — URINE MICROSCOPIC-ADD ON

## 2016-02-05 LAB — URINALYSIS, ROUTINE W REFLEX MICROSCOPIC
Bilirubin Urine: NEGATIVE
Glucose, UA: NEGATIVE mg/dL
Ketones, ur: 40 mg/dL — AB
Leukocytes, UA: NEGATIVE
Nitrite: NEGATIVE
Protein, ur: NEGATIVE mg/dL
Specific Gravity, Urine: 1.029 (ref 1.005–1.030)
pH: 7 (ref 5.0–8.0)

## 2016-02-05 LAB — WET PREP, GENITAL
Sperm: NONE SEEN
Trich, Wet Prep: NONE SEEN
WBC, Wet Prep HPF POC: NONE SEEN
Yeast Wet Prep HPF POC: NONE SEEN

## 2016-02-05 LAB — LIPASE, BLOOD: Lipase: 26 U/L (ref 11–51)

## 2016-02-05 LAB — POC URINE PREG, ED: Preg Test, Ur: NEGATIVE

## 2016-02-05 MED ORDER — METRONIDAZOLE 500 MG PO TABS
500.0000 mg | ORAL_TABLET | Freq: Two times a day (BID) | ORAL | Status: DC
Start: 2016-02-05 — End: 2017-07-26

## 2016-02-05 MED ORDER — ONDANSETRON 4 MG PO TBDP
ORAL_TABLET | ORAL | Status: AC
  Filled 2016-02-05: qty 1

## 2016-02-05 MED ORDER — ALBUTEROL SULFATE HFA 108 (90 BASE) MCG/ACT IN AERS
2.0000 | INHALATION_SPRAY | Freq: Once | RESPIRATORY_TRACT | Status: AC
  Administered 2016-02-05: 2 via RESPIRATORY_TRACT
  Filled 2016-02-05: qty 6.7

## 2016-02-05 MED ORDER — CEFTRIAXONE SODIUM 250 MG IJ SOLR
250.0000 mg | INTRAMUSCULAR | Status: DC
  Administered 2016-02-05: 250 mg via INTRAMUSCULAR
  Filled 2016-02-05: qty 250

## 2016-02-05 MED ORDER — DOXYCYCLINE HYCLATE 100 MG PO CAPS: 100.0000 mg | ORAL_CAPSULE | Freq: Two times a day (BID) | ORAL | Status: DC

## 2016-02-05 MED ORDER — ONDANSETRON HCL 4 MG/2ML IJ SOLN: 4.0000 mg | Freq: Once | INTRAMUSCULAR | Status: DC | PRN

## 2016-02-05 MED ORDER — ONDANSETRON 4 MG PO TBDP
4.0000 mg | ORAL_TABLET | Freq: Once | ORAL | Status: AC
  Administered 2016-02-05: 4 mg via ORAL

## 2016-02-05 NOTE — Discharge Instructions (Signed)
Follow-up with us tomorrow for your ultrasound the you are unable to get today. Return to emergency department if you experience worsening abdominal pain, fever, chills, nausea, vomiting.  Abdominal Pain, Adult Many things can cause abdominal pain. Usually, abdominal pain is not caused by a disease and will improve without treatment. It can often be observed and treated at home. Your health care provider will do a physical exam and possibly order blood tests and X-rays to help determine the seriousness of your pain. However, in many cases, more time must pass before a clear cause of the pain can be found. Before that point, your health care provider may not know if you need more testing or further treatment. HOME CARE INSTRUCTIONS Monitor your abdominal pain for any changes. The following actions may help to alleviate any discomfort you are experiencing:  Only take over-the-counter or prescription medicines as directed by your health care provider.  Do not take laxatives unless directed to do so by your health care provider.  Try a clear liquid diet (broth, tea, or water) as directed by your health care provider. Slowly move to a bland diet as tolerated. SEEK MEDICAL CARE IF:  You have unexplained abdominal pain.  You have abdominal pain associated with nausea or diarrhea.  You have pain when you urinate or have a bowel movement.  You experience abdominal pain that wakes you in the night.  You have abdominal pain that is worsened or improved by eating food.  You have abdominal pain that is worsened with eating fatty foods.  You have a fever. SEEK IMMEDIATE MEDICAL CARE IF:  Your pain does not go away within 2 hours.  You keep throwing up (vomiting).  Your pain is felt only in portions of the abdomen, such as the right side or the left lower portion of the abdomen.  You pass bloody or black tarry stools. MAKE SURE YOU:  Understand these instructions.  Will watch your  condition.  Will get help right away if you are not doing well or get worse.   This information is not intended to replace advice given to you by your health care provider. Make sure you discuss any questions you have with your health care provider.   Document Released: 06/19/2005 Document Revised: 05/31/2015 Document Reviewed: 05/19/2013 Elsevier Interactive Patient Education [employer]2016 [employer]

## 2016-02-05 NOTE — ED Notes (Signed)
Patient given discharge papers by PA and Rx. Pt medicated as prescribed. Pt denies further questions or needs at time of discharge.

## 2016-02-05 NOTE — ED Notes (Signed)
Pt signed AMA form. VSS. Pt escorted to exit, aware that she may return at any time.

## 2016-02-05 NOTE — Progress Notes (Signed)
Patient noted to have been seen in the ED 8 times within the last six months.  Patient listed as having Medicaid insurance.  [facility]'s [facility]  EDCM spoke to patient at bedside.  Patient reports she was last seen by her pcp three months ago.  She reports she called her pcp today but she is out of town for a week, so she came to the ED.  She reports she has an appointment with her pcp on May 19th.  Patient reports she does nto require assistance with the cost of her medications and she does not have issues with transportation to her doctors appointments.  No further EDCM needs at this time.

## 2016-02-05 NOTE — ED Notes (Signed)
PA requested pt to return from lobby for medication injection. Pt returned to room at this time.

## 2016-02-05 NOTE — ED Notes (Addendum)
Pt c/o RLQ pain. Hx of ovarian cyst. Pt states this feels like previous cyst pain. Pt also reports dry cough. Reports N/V due to cough. Denies fever.

## 2016-02-05 NOTE — ED Notes (Signed)
Pt requesting to leave AMA d/t child care issues. PA aware, at bedside. Iv removed. Site clean and dry. Pt reports pain controlled at 2/10 at this time.

## 2016-02-05 NOTE — ED Notes (Signed)
Pelvic supplies at bedside. 

## 2016-02-05 NOTE — ED Provider Notes (Signed)
CSN: [id number]     Arrival date & time 02/05/16  1732 History   First MD Initiated Contact with Patient 02/05/16 1902     Chief Complaint  Patient presents with  . Abdominal Pain     (Consider location/radiation/quality/duration/timing/severity/associated sxs/prior Treatment) HPI   Patient is a 25 year old female with a history of seizures, asthma, migraines who presents to the emergency department with worsening right lower quadrant pain. She states the pain has been intermittent for one month but has progressively gotten worse in the past week. She is now states the pain is constant and waxes and wanes between 6/10 and 10/10. She has not taken anything for the pain. She states the pain is similar to an episode in February 2016 where she was worked up for kidney stones, negative for kidney stones, but they found face 3 cm cyst on her right ovary. She denies hematuria, dysuria, hematochezia, diarrhea, nausea, vomiting. New sexual partner 6 months ago and was treated for chlamydia roughly 4 months ago. Patient states she does not need anything for pain at this time.  Past Medical History  Diagnosis Date  . Seizures (HCC)   . Closed head injury   . Scoliosis   . Asthma   . Migraine    Past Surgical History  Procedure Laterality Date  . Back surgery      screws placed for scoliosis   Family History  Problem Relation Age of Onset  . Anesthesia problems Neg Hx   . Hypertension Father   . Diabetes Maternal Grandmother   . Hypertension Maternal Grandmother   . Diabetes Paternal Grandmother   . Hypertension Paternal Grandmother    Social History  Substance Use Topics  . Smoking status: Never Smoker   . Smokeless tobacco: Never Used  . Alcohol Use: No   OB History    Gravida Para Term Preterm AB TAB SAB Ectopic Multiple Living   2 2 1 1      2      Review of Systems  Constitutional: Negative for fever and chills.  HENT: Negative for sore throat and trouble swallowing.   Eyes:  Negative for visual disturbance.  Respiratory: Positive for cough. Negative for chest tightness and shortness of breath.   Cardiovascular: Negative for chest pain and leg swelling.  Gastrointestinal: Positive for abdominal pain. Negative for nausea, vomiting, diarrhea, constipation, blood in stool and abdominal distention.  Genitourinary: Negative for dysuria, hematuria, vaginal bleeding and vaginal discharge.  Musculoskeletal: Negative for back pain and neck pain.  Skin: Negative for rash.  Neurological: Negative for dizziness, syncope, weakness and headaches.      Allergies  Review of patient's allergies indicates no known allergies.  Home Medications   Prior to Admission medications   Medication Sig Start Date End Date Taking? Authorizing Provider  doxycycline (VIBRAMYCIN) 100 MG capsule Take 1 capsule (100 mg total) by mouth 2 (two) times daily. 02/05/16   Jerre SimonJessica L Vershawn Westrup, PA  metroNIDAZOLE (FLAGYL) 500 MG tablet Take 1 tablet (500 mg total) by mouth 2 (two) times daily. 02/05/16   Ingrid Shifrin L Ladiamond Gallina, PA  ondansetron (ZOFRAN) 4 MG tablet Take 1 tablet (4 mg total) by mouth every 6 (six) hours. Patient not taking: Reported on 01/06/2016 12/06/15   Melton KrebsSamantha Nicole Riley, PA-C  selenium sulfide (SELSUN) 2.5 % shampoo Apply 1 application topically daily as needed for irritation. Patient not taking: Reported on 12/06/2015 09/06/15   Satira SarkNicole Elizabeth Nadeau, PA-C   BP 101/76 mmHg  Pulse 92  Temp(Src)  98.3 F (36.8 C) (Oral)  Resp 17  SpO2 100%  LMP 12/23/2015 Physical Exam  Constitutional: She appears well-developed and well-nourished. No distress.  HENT:  Head: Normocephalic and atraumatic.  Eyes: Conjunctivae are normal.  Neck: Normal range of motion.  Cardiovascular: Normal rate and normal heart sounds.   Pulmonary/Chest: Effort normal and breath sounds normal.  Abdominal: Soft. Normal appearance and bowel sounds are normal.  TTP of the right suprapubic region   Genitourinary:   Exam performed by Jerre Simon,  exam chaperoned Date: 02/06/2016 Pelvic exam: normal external genitalia without evidence of trauma. VULVA: normal appearing vulva with no masses, tenderness or lesion. VAGINA: normal appearing vagina with normal color and discharge, no lesions. CERVIX: normal appearing cervix without lesions, cervical motion tenderness absent, cervical os closed with out purulent discharge; vaginal discharge - milky and scant, Wet prep and DNA probe for chlamydia and GC obtained.   ADNEXA: normal adnexa in size, TTP of the right adnexa and no masses UTERUS: uterus is normal size, shape, consistency and mildly TTP.    Musculoskeletal: Normal range of motion. She exhibits no edema.  Neurological: She is alert. Coordination normal.  Skin: Skin is warm and dry. No rash noted.  Psychiatric: She has a normal mood and affect. Her behavior is normal.  Nursing note and vitals reviewed.   ED Course  Procedures (including critical care time) Labs Review Labs Reviewed  WET PREP, GENITAL - Abnormal; Notable for the following:    Clue Cells Wet Prep HPF POC PRESENT (*)    All other components within normal limits  URINALYSIS, ROUTINE W REFLEX MICROSCOPIC (NOT AT [facility]) - Abnormal; Notable for the following:    Hgb urine dipstick SMALL (*)    Ketones, ur 40 (*)    All other components within normal limits  URINE MICROSCOPIC-ADD ON - Abnormal; Notable for the following:    Squamous Epithelial / LPF 0-5 (*)    Bacteria, UA RARE (*)    All other components within normal limits  LIPASE, BLOOD  COMPREHENSIVE METABOLIC PANEL  CBC  POC URINE PREG, ED  GC/CHLAMYDIA PROBE AMP ([place]) NOT AT [facility]    Imaging Review No results found. I have personally reviewed and evaluated these images and lab results as part of my medical decision-making.   EKG Interpretation None      MDM   Final diagnoses:  Right lower quadrant abdominal pain   Afebrile, well-appearing patient  with adnexal tenderness on exam concerning for tubo-ovarian abscess, PID, or ovarian torsion.  Less likely appendicitis since pain has been going on for over one month and location of pain more suprapubic. Ordered a transvaginal ultrasound for the patient refused, stating she could not stay due to problems at home with her children. She states she'll come here in the morning to have the ultrasound to rule out adnexal pathology. Treated the patient for PID with Rocephin and doxycycline in the event she does not follow up. Also gave her a prescription for Flagyl for BV. Other labs unremarkable. Instructed the patient also follow up with her primary care provider soon as possible regarding her visit today and to return tomorrow for an ultrasound.   Discussed strict return precautions with the patient. She expressed understanding to the discharge instructions.     Jerre Simon, PA 02/06/16 0100  Doug Sou, MD 02/06/16 [phone]

## 2016-02-06 ENCOUNTER — Encounter (HOSPITAL_COMMUNITY): Payer: Self-pay | Admitting: Emergency Medicine

## 2016-02-06 ENCOUNTER — Emergency Department (HOSPITAL_COMMUNITY)
Admission: EM | Admit: 2016-02-06 | Discharge: 2016-02-06 | Disposition: A | Discharge status: Home / Self Care | Payer: Medicaid Other | Attending: Emergency Medicine | Admitting: Emergency Medicine

## 2016-02-06 DIAGNOSIS — R109 Unspecified abdominal pain: Secondary | ICD-10-CM | POA: Diagnosis present

## 2016-02-06 DIAGNOSIS — Z5321 Procedure and treatment not carried out due to patient leaving prior to being seen by health care provider: Secondary | ICD-10-CM | POA: Diagnosis not present

## 2016-02-06 LAB — GC/CHLAMYDIA PROBE AMP (~~LOC~~) NOT AT ARMC
Chlamydia: NEGATIVE
Neisseria Gonorrhea: NEGATIVE

## 2016-02-06 NOTE — ED Notes (Signed)
Pt states that she was here yesterday for evaluation of lower R pelivc/abdominal pain and had to leave AMA. States she is back to get the ultrasound that she was supposed to have last night. Alert and oriented.

## 2016-02-06 NOTE — ED Notes (Signed)
No answer from pt when called from lobby for room x 1.

## 2016-02-06 NOTE — ED Notes (Signed)
No answer from pt when called from lobby for room x 2.

## 2016-02-06 NOTE — ED Provider Notes (Signed)
Complains of right lower quadrant intermittent for the past 2 months. Denies anorexia denies fever. Presently discomfort is mild.  Doug SouSam Dusty Wagoner, MD 02/06/16 [phone]

## 2016-03-25 ENCOUNTER — Emergency Department (HOSPITAL_COMMUNITY)
Admission: EM | Admit: 2016-03-25 | Discharge: 2016-03-25 | Disposition: A | Discharge status: Home / Self Care | Payer: Medicaid Other | Attending: Emergency Medicine | Admitting: Emergency Medicine

## 2016-03-25 ENCOUNTER — Encounter (HOSPITAL_COMMUNITY): Payer: Self-pay | Admitting: Emergency Medicine

## 2016-03-25 ENCOUNTER — Emergency Department (HOSPITAL_COMMUNITY): Payer: Medicaid Other

## 2016-03-25 DIAGNOSIS — Y939 Activity, unspecified: Secondary | ICD-10-CM | POA: Insufficient documentation

## 2016-03-25 DIAGNOSIS — R1031 Right lower quadrant pain: Secondary | ICD-10-CM

## 2016-03-25 DIAGNOSIS — Z7951 Long term (current) use of inhaled steroids: Secondary | ICD-10-CM | POA: Insufficient documentation

## 2016-03-25 DIAGNOSIS — Y999 Unspecified external cause status: Secondary | ICD-10-CM | POA: Insufficient documentation

## 2016-03-25 DIAGNOSIS — M7918 Myalgia, other site: Secondary | ICD-10-CM

## 2016-03-25 DIAGNOSIS — M791 Myalgia: Secondary | ICD-10-CM | POA: Insufficient documentation

## 2016-03-25 DIAGNOSIS — Y929 Unspecified place or not applicable: Secondary | ICD-10-CM | POA: Diagnosis not present

## 2016-03-25 LAB — POC URINE PREG, ED: Preg Test, Ur: NEGATIVE

## 2016-03-25 MED ORDER — HYDROCODONE-ACETAMINOPHEN 5-325 MG PO TABS
1.0000 | ORAL_TABLET | Freq: Once | ORAL | Status: AC
  Administered 2016-03-25: 1 via ORAL
  Filled 2016-03-25: qty 1

## 2016-03-25 MED ORDER — NAPROXEN 500 MG PO TABS: 500.0000 mg | ORAL_TABLET | Freq: Two times a day (BID) | ORAL | Status: DC
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
## 2016-03-25 NOTE — ED Notes (Signed)
Ultrasound at bedside

## 2016-03-25 NOTE — ED Notes (Signed)
Patient transported to X-ray 
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
## 2016-03-25 NOTE — Discharge Instructions (Signed)
Abdominal Pain, Adult Many things can cause abdominal pain. Usually, abdominal pain is not caused by a disease and will improve without treatment. It can often be observed and treated at home. Your health care provider will do a physical exam and possibly order blood tests and X-rays to help determine the seriousness of your pain. However, in many cases, more time must pass before a clear cause of the pain can be found. Before that point, your health care provider may not know if you need more testing or further treatment. HOME CARE INSTRUCTIONS Monitor your abdominal pain for any changes. The following actions may help to alleviate any discomfort you are experiencing:  Only take over-the-counter or prescription medicines as directed by your health care provider.  Do not take laxatives unless directed to do so by your health care provider.  Try a clear liquid diet (broth, tea, or water) as directed by your health care provider. Slowly move to a bland diet as tolerated. SEEK MEDICAL CARE IF:  You have unexplained abdominal pain.  You have abdominal pain associated with nausea or diarrhea.  You have pain when you urinate or have a bowel movement.  You experience abdominal pain that wakes you in the night.  You have abdominal pain that is worsened or improved by eating food.  You have abdominal pain that is worsened with eating fatty foods.  You have a fever. SEEK IMMEDIATE MEDICAL CARE IF:  Your pain does not go away within 2 hours.  You keep throwing up (vomiting).  Your pain is felt only in portions of the abdomen, such as the right side or the left lower portion of the abdomen.  You pass bloody or black tarry stools. MAKE SURE YOU:  Understand these instructions.  Will watch your condition.  Will get help right away if you are not doing well or get worse.   This information is not intended to replace advice given to you by your health care provider. Make sure you discuss  any questions you have with your health care provider.   Document Released: 06/19/2005 Document Revised: 05/31/2015 Document Reviewed: 05/19/2013 Elsevier Interactive Patient Education 2016 [employer] Inc[employer]  Musculoskeletal Pain Musculoskeletal pain is muscle and boney aches and pains. These pains can occur in any part of the body. Your caregiver may treat you without knowing the cause of the pain. They may treat you if blood or urine tests, X-rays, and other tests were normal.  CAUSES There is often not a definite cause or reason for these pains. These pains may be caused by a type of germ (virus). The discomfort may also come from overuse. Overuse includes working out too hard when your body is not fit. Boney aches also come from weather changes. Bone is sensitive to atmospheric pressure changes. HOME CARE INSTRUCTIONS   Ask when your test results will be ready. Make sure you get your test results.  Only take over-the-counter or prescription medicines for pain, discomfort, or fever as directed by your caregiver. If you were given medications for your condition, do not drive, operate machinery or power tools, or sign legal documents for 24 hours. Do not drink alcohol. Do not take sleeping pills or other medications that may interfere with treatment.  Continue all activities unless the activities cause more pain. When the pain lessens, slowly resume normal activities. Gradually increase the intensity and duration of the activities or exercise.  During periods of severe pain, bed rest may be helpful. Lay or sit in any  position that is comfortable.  Putting ice on the injured area.  Put ice in a bag.  Place a towel between your skin and the bag.  Leave the ice on for 15 to 20 minutes, 3 to 4 times a day.  Follow up with your caregiver for continued problems and no reason can be found for the pain. If the pain becomes worse or does not go away, it may be necessary to repeat tests or do  additional testing. Your caregiver may need to look further for a possible cause. SEEK IMMEDIATE MEDICAL CARE IF:  You have pain that is getting worse and is not relieved by medications.  You develop chest pain that is associated with shortness or breath, sweating, feeling sick to your stomach (nauseous), or throw up (vomit).  Your pain becomes localized to the abdomen.  You develop any new symptoms that seem different or that concern you. MAKE SURE YOU:   Understand these instructions.  Will watch your condition.  Will get help right away if you are not doing well or get worse.   This information is not intended to replace advice given to you by your health care provider. Make sure you discuss any questions you have with your health care provider.   Document Released: 09/09/2005 Document Revised: 12/02/2011 Document Reviewed: 05/14/2013 Elsevier Interactive Patient Education 2016 [employer]  General Assault Assault includes any behavior or physical attack--whether it is on purpose or not--that results in injury to another person, damage to property, or both. This also includes assault that has not yet happened, but is planned to happen. Threats of assault may be physical, verbal, or written. They may be said or sent by:  Mail.  E-mail.  Text.  Social media.  Fax. The threats may be direct, implied, or understood. WHAT ARE THE DIFFERENT FORMS OF ASSAULT? Forms of assault include:  Physically assaulting a person. This includes physical threats to inflict physical harm as well as:  Slapping.  Hitting.  Poking.  Kicking.  Punching.  Pushing.  Sexually assaulting a person. Sexual assault is any sexual activity that a person is forced, threatened, or coerced to participate in. It may or may not involve physical contact with the person who is assaulting you. You are sexually assaulted if you are forced to have sexual contact of any kind.  Damaging or destroying a  person's assistive equipment, such as glasses, canes, or walkers.  Throwing or hitting objects.  Using or displaying a weapon to harm or threaten someone.  Using or displaying an object that appears to be a weapon in a threatening manner.  Using greater physical size or strength to intimidate someone.  Making intimidating or threatening gestures.  Bullying.  Hazing.  Using language that is intimidating, threatening, hostile, or abusive.  Stalking.  Restraining someone with force. WHAT SHOULD I DO IF I EXPERIENCE ASSAULT?  Report assaults, threats, and stalking to the police. Call your local emergency services (911 in the [place] if you are in immediate danger or you need medical help.  You can work with a Clinical research associatelawyer or an advocate to get legal protection against someone who has assaulted you or threatened you with assault. Protection includes restraining orders and private addresses. Crimes against you, such as assault, can also be prosecuted through the courts. Laws will vary depending on where you live.   This information is not intended to replace advice given to you by your health care provider. Make sure you discuss any questions you  have with your health care provider.   Keep scheduled appointment with your PCP for physical exam. See your PCP sooner if your symptoms do not improve. Apply ice to affected area. Take Naprosyn as needed for pain. Return to the ED if you experience severe worsening of your symptoms, loss of consciousness, blurry vision, difficulty walking, fevers, chill, vaginal discharge or bleeding.

## 2016-04-12 ENCOUNTER — Encounter (HOSPITAL_COMMUNITY): Payer: Self-pay | Admitting: Emergency Medicine

## 2016-04-12 ENCOUNTER — Emergency Department (HOSPITAL_COMMUNITY)
Admission: EM | Admit: 2016-04-12 | Discharge: 2016-04-12 | Disposition: A | Discharge status: Home / Self Care | Payer: Medicaid Other | Attending: Emergency Medicine | Admitting: Emergency Medicine

## 2016-04-12 DIAGNOSIS — J45909 Unspecified asthma, uncomplicated: Secondary | ICD-10-CM | POA: Insufficient documentation

## 2016-04-12 DIAGNOSIS — G40909 Epilepsy, unspecified, not intractable, without status epilepticus: Secondary | ICD-10-CM | POA: Diagnosis not present

## 2016-04-12 DIAGNOSIS — R569 Unspecified convulsions: Secondary | ICD-10-CM | POA: Diagnosis present

## 2016-04-12 LAB — CBC WITH DIFFERENTIAL/PLATELET
Basophils Absolute: 0 10*3/uL (ref 0.0–0.1)
Basophils Relative: 0 %
Eosinophils Absolute: 0.1 10*3/uL (ref 0.0–0.7)
Eosinophils Relative: 1 %
HCT: 41.7 % (ref 36.0–46.0)
Hemoglobin: 14.1 g/dL (ref 12.0–15.0)
Lymphocytes Relative: 15 %
Lymphs Abs: 2 10*3/uL (ref 0.7–4.0)
MCH: 32 pg (ref 26.0–34.0)
MCHC: 33.8 g/dL (ref 30.0–36.0)
MCV: 94.8 fL (ref 78.0–100.0)
Monocytes Absolute: 0.9 10*3/uL (ref 0.1–1.0)
Monocytes Relative: 7 %
Neutro Abs: 10 10*3/uL — ABNORMAL HIGH (ref 1.7–7.7)
Neutrophils Relative %: 77 %
Platelets: 212 10*3/uL (ref 150–400)
RBC: 4.4 MIL/uL (ref 3.87–5.11)
RDW: 13.1 % (ref 11.5–15.5)
WBC: 13 10*3/uL — ABNORMAL HIGH (ref 4.0–10.5)

## 2016-04-12 LAB — COMPREHENSIVE METABOLIC PANEL
ALT: 15 U/L (ref 14–54)
AST: 20 U/L (ref 15–41)
Albumin: 4.2 g/dL (ref 3.5–5.0)
Alkaline Phosphatase: 63 U/L (ref 38–126)
Anion gap: 5 (ref 5–15)
BUN: 16 mg/dL (ref 6–20)
CO2: 26 mmol/L (ref 22–32)
Calcium: 9.4 mg/dL (ref 8.9–10.3)
Chloride: 108 mmol/L (ref 101–111)
Creatinine, Ser: 0.78 mg/dL (ref 0.44–1.00)
GFR calc Af Amer: >60 mL/min (ref >60)
GFR calc non Af Amer: >60 mL/min (ref >60)
Glucose, Bld: 87 mg/dL (ref 65–99)
Potassium: 4 mmol/L (ref 3.5–5.1)
Sodium: 139 mmol/L (ref 135–145)
Total Bilirubin: 0.7 mg/dL (ref 0.3–1.2)
Total Protein: 7.3 g/dL (ref 6.5–8.1)

## 2016-04-12 LAB — CBG MONITORING, ED: Glucose-Capillary: 80 mg/dL (ref 65–99)

## 2016-04-12 LAB — ETHANOL: Alcohol, Ethyl (B): <5 mg/dL (ref <5)

## 2016-04-12 NOTE — Discharge Instructions (Signed)
Seizure, Adult °A seizure means there is unusual activity in the brain. A seizure can cause changes in attention or behavior. Seizures often cause shaking (convulsions). Seizures often last from 30 seconds to 2 minutes. °HOME CARE  °· If you are given medicines, take them exactly as told by your doctor. °· Keep all doctor visits as told. °· Do not swim or drive until your doctor says it is okay. °· Teach others what to do if you have a seizure. They should: °¨ Lay you on the ground. °¨ Put a cushion under your head. °¨ Loosen any tight clothing around your neck. °¨ Turn you on your side. °¨ Stay with you until you get better. °GET HELP RIGHT AWAY IF:  °· The seizure lasts longer than 2 to 5 minutes. °· The seizure is very bad. °· The person does not wake up after the seizure. °· The person's attention or behavior changes. °Drive the person to the emergency room or call your local emergency services (911 in [place] °MAKE SURE YOU:  °· Understand these instructions. °· Will watch your condition. °· Will get help right away if you are not doing well or get worse. °  °This information is not intended to replace advice given to you by your health care provider. Make sure you discuss any questions you have with your health care provider. °  °Document Released: 02/26/2008 Document Revised: 12/02/2011 Document Reviewed: 04/21/2013 °Elsevier Interactive Patient Education ©2016 Elsevier Inc. ° °

## 2016-04-12 NOTE — ED Provider Notes (Signed)
CSN: [id number]     Arrival date & time 04/12/16  1847 History   First MD Initiated Contact with Patient 04/12/16 1857     Chief Complaint  Patient presents with  . Seizures     (Consider location/radiation/quality/duration/timing/severity/associated sxs/prior Treatment) HPI   25 year old female with history of seizure brought here via EMS from home following 2 episodes of seizure-like activities. Patient reports she had a verbal argument with her husband today and subsequently had 3 episodes of witnessed seizures lasting <15 second each.  Husband, who is at bedside, states pt was foaming at the mouth and had convulsive activity.  Pt is now back to her baseline.  She report having similar seizures episodes in the past whenever she became too emotional.  Last seizure was a month ago. 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  However, since [place] to the [place], she has been evaluated for it but was not placed on any anticonvulsant medication after being tested for seizures.  She attributed her seizures to stress.  She denies alcohol or tobacco abuse.  She denies any recent sickness.  LMP 07/07.  Aside from mild frontal headache (which she usually have when having seizures) she denies having recent sickness, cp, sob, abd pain, back pain, dysuria, or rash.    Past Medical History  Diagnosis Date  . Seizures (HCC)   . Closed head injury   . Scoliosis   . Asthma   . Migraine    Past Surgical History  Procedure Laterality Date  . Back surgery      screws placed for scoliosis   Family History  Problem Relation Age of Onset  . Anesthesia problems Neg Hx   . Hypertension Father   . Diabetes Maternal Grandmother   . Hypertension Maternal Grandmother   . Diabetes Paternal Grandmother   . Hypertension Paternal Grandmother    Social History  Substance Use Topics  . Smoking status: Never Smoker   . Smokeless tobacco: Never Used  . Alcohol Use: No    OB History    Gravida Para Term Preterm AB TAB SAB Ectopic Multiple Living   2 2 1 1      2      Review of Systems  All other systems reviewed and are negative.     Allergies  Review of patient's allergies indicates no known allergies.  Home Medications   Prior to Admission medications   Medication Sig Start Date End Date Taking? Authorizing Provider  albuterol (PROVENTIL HFA;VENTOLIN HFA) 108 (90 Base) MCG/ACT inhaler Inhale 2 puffs into the lungs every 6 (six) hours as needed for wheezing or shortness of breath.    Historical Provider, MD  doxycycline (VIBRAMYCIN) 100 MG capsule Take 1 capsule (100 mg total) by mouth 2 (two) times daily. Patient not taking: Reported on 03/25/2016 02/05/16   Jerre SimonJessica L Focht, PA  metroNIDAZOLE (FLAGYL) 500 MG tablet Take 1 tablet (500 mg total) by mouth 2 (two) times daily. Patient not taking: Reported on 03/25/2016 02/05/16   Jerre SimonJessica L Focht, PA  naproxen (NAPROSYN) 500 MG tablet Take 1 tablet (500 mg total) by mouth 2 (two) times daily. 03/25/16   Samantha Tripp Dowless, PA-C  ondansetron (ZOFRAN) 4 MG tablet Take 1 tablet (4 mg total) by mouth every 6 (six) hours. Patient not taking: Reported on 01/06/2016 12/06/15   Melton KrebsSamantha Nicole Riley, PA-C   SpO2 99%  LMP 03/29/2016 Physical Exam  Constitutional: She is oriented to person,  place, and time. She appears well-developed and well-nourished. No distress.  HENT:  Head: Normocephalic and atraumatic.  No tongue biting  Eyes: Conjunctivae and EOM are normal. Pupils are equal, round, and reactive to light.  Neck: Normal range of motion. Neck supple.  No nuchal rigidity  Cardiovascular: Normal rate and regular rhythm.   Pulmonary/Chest: Effort normal and breath sounds normal.  Abdominal: Soft. There is no tenderness.  Musculoskeletal:  5 out 5 strength to all 4 extremities  Neurological: She is alert and oriented to person, place, and time. She has normal strength. No cranial nerve deficit or sensory  deficit. GCS eye subscore is 4. GCS verbal subscore is 5. GCS motor subscore is 6.  Skin: No rash noted.  Psychiatric: She exhibits a depressed mood.  Nursing note and vitals reviewed.   ED Course  Procedures (including critical care time) Labs Review Labs Reviewed  CBC WITH DIFFERENTIAL/PLATELET - Abnormal; Notable for the following:    WBC 13.0 (*)    Neutro Abs 10.0 (*)    All other components within normal limits  COMPREHENSIVE METABOLIC PANEL  ETHANOL  URINE RAPID DRUG SCREEN, HOSP PERFORMED  URINALYSIS, ROUTINE W REFLEX MICROSCOPIC (NOT AT [facility])  PREGNANCY, URINE  CBG MONITORING, ED    Imaging Review No results found. I have personally reviewed and evaluated these images and lab results as part of my medical decision-making.   EKG Interpretation None      MDM   Final diagnoses:  Seizure (HCC)    BP 98/59 mmHg  Pulse 92  Resp 19  SpO2 100%  LMP 03/29/2016   Pt here with seizure activities earlier today.  Now back to baseline.  Will perform screening labs and monitor.    8:21 PM Pt sts she is back to her baseline and request to be discharged.  She understand our evaluation have not been completed yet . She agrees to leave AMA and understand that she can return if her condition worsen.   Fayrene Helper, PA-C 04/12/16 2022  Alvira Monday, MD 04/13/16 [phone]

## 2016-04-12 NOTE — ED Notes (Signed)
Bed: WA08 Expected date:  Expected time:  Means of arrival:  Comments: EMS- 25 yo seizure

## 2016-04-12 NOTE — ED Notes (Signed)
Pt urinated at 1935 before order for sample was put in; will try to obtain urine later

## 2016-04-12 NOTE — ED Notes (Signed)
Pt from home with EMS following 2 episodes of seizure like activity. Pt denies fall, she was lowered to the ground. Pt was tearful with EMS and was not post ictal. Per EMS, pt does not take any medications for seizures and only gets them when she is stressed. Pt is currently going through a divorce

## 2016-05-07 ENCOUNTER — Emergency Department (HOSPITAL_COMMUNITY)
Admission: EM | Admit: 2016-05-07 | Discharge: 2016-05-07 | Discharge status: Against Medical Advice | Payer: No Typology Code available for payment source

## 2016-05-18 IMAGING — CT CT HEAD W/O CM
5 of 10 series · 17 of 47 positions shown, 19 images · non-contrast
Comparison: 11/22/2015

CLINICAL DATA: Fell downstairs after twisting ankle.

EXAM:
CT HEAD WITHOUT CONTRAST
CT MAXILLOFACIAL WITHOUT CONTRAST
CT CERVICAL SPINE WITHOUT CONTRAST
TECHNIQUE: Multidetector CT imaging of the head, cervical spine, and
maxillofacial structures were performed using the standard protocol
without intravenous contrast. Multiplanar CT image reconstructions
of the cervical spine and maxillofacial structures were also
generated.

[Series 3: bone windows · axial · 0.41mm/px · z∈[-98,-53]mm · 2 of 45 slices shown]
[im 15/45  bone]
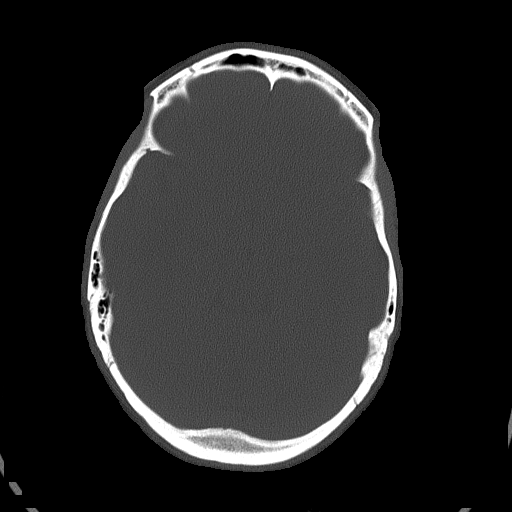
[im 30/45  bone]
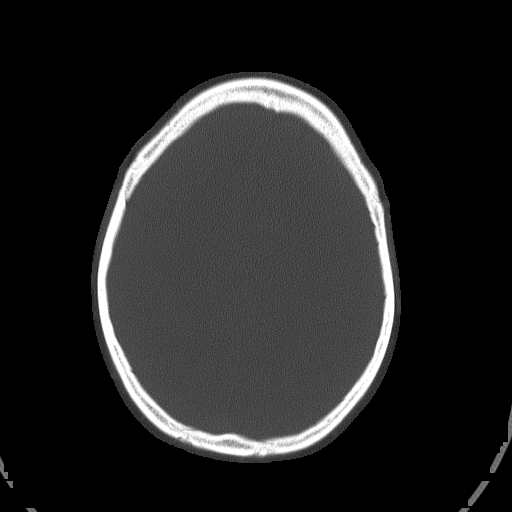

[Series 4: facial st · axial · 0.30mm/px · z∈[-215,-119]mm · 5 of 74 slices shown]
[im 13/74  brain]
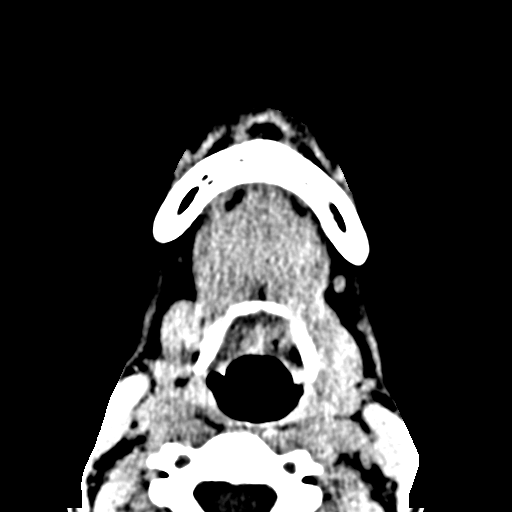
[im 25/74  brain]
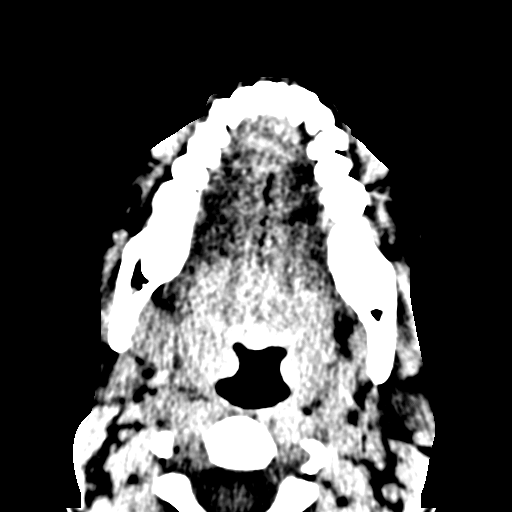
[im 37/74  brain]
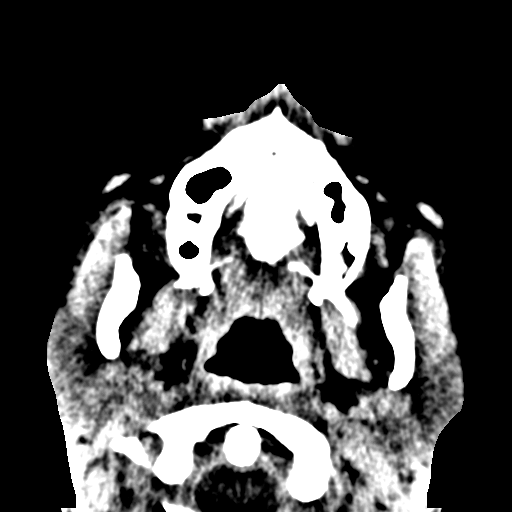
[im 49/74  brain]
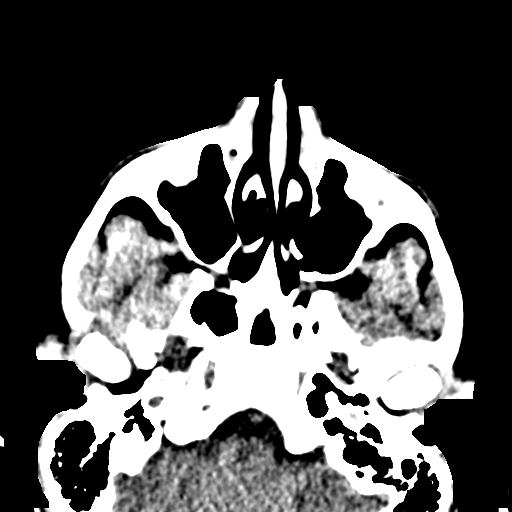
[im 61/74  brain]
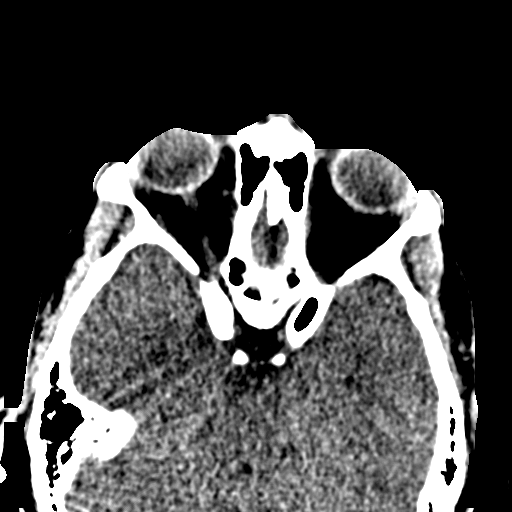

[Series 11: coronal st · coronal · 0.33mm/px · 3 of 57 slices shown]
[im 12/57  brain]
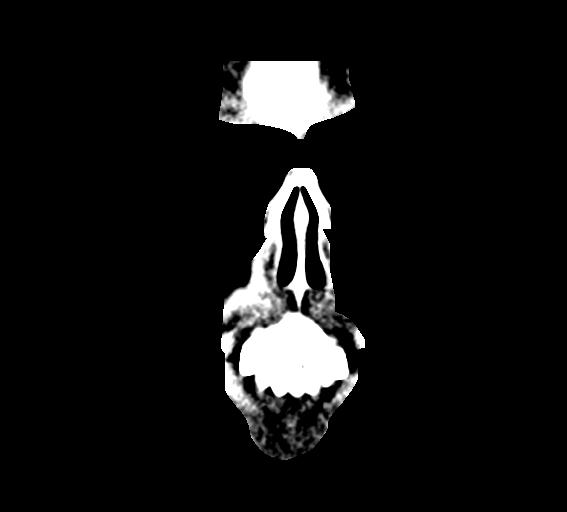
[im 23/57  brain]
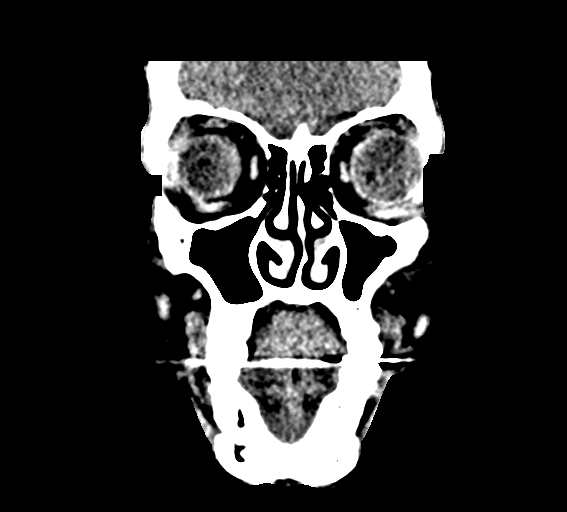
[im 34/57  brain]
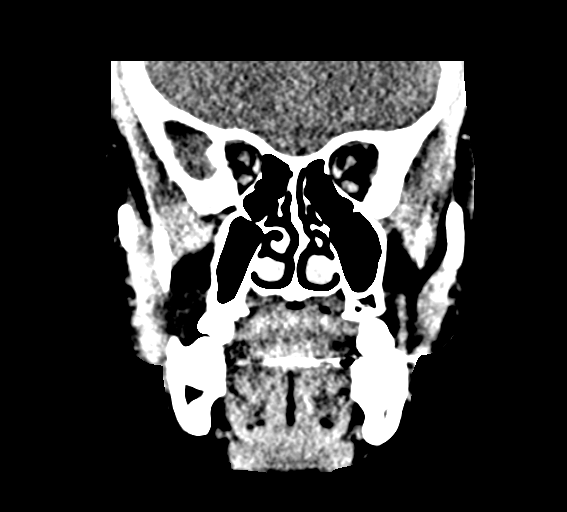

[Series 14: sagittal bone · sagittal · 0.32mm/px · 1 of 74 slices shown]
[im 37/74  brain]
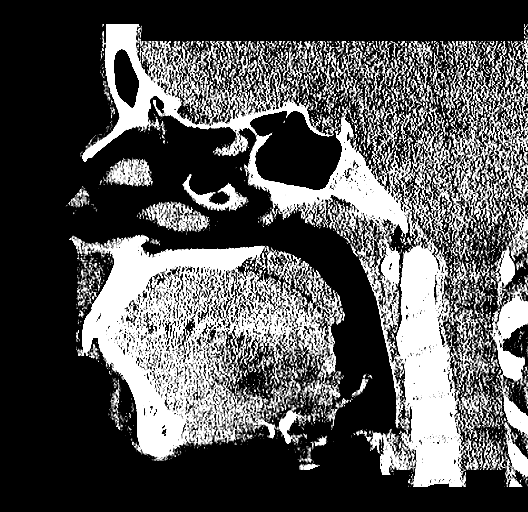

[Series 17: axial · axial · 0.23mm/px · z∈[-299,-171]mm · 6 of 93 slices shown, 8 images]
[im 14/93  brain]
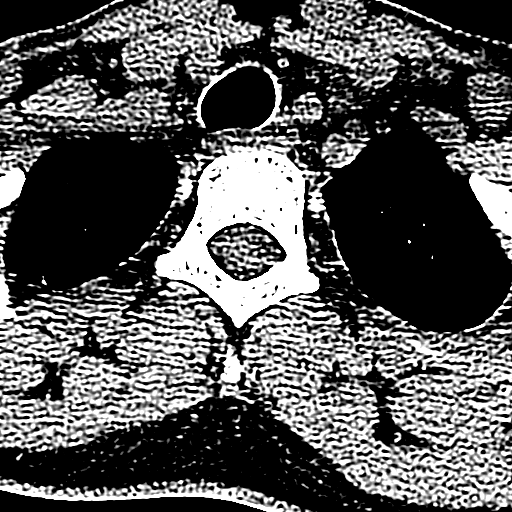
[im 14/93  bone]
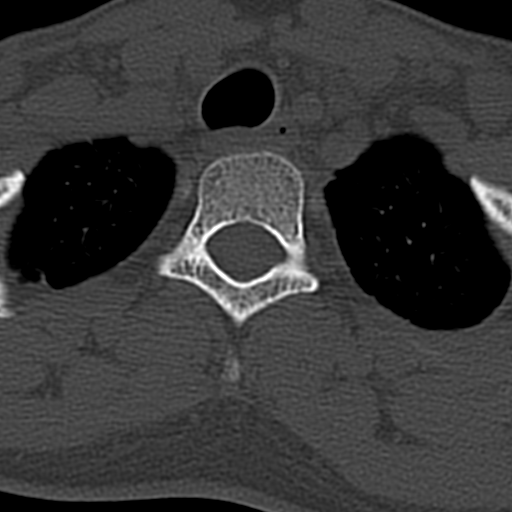
[im 27/93  brain]
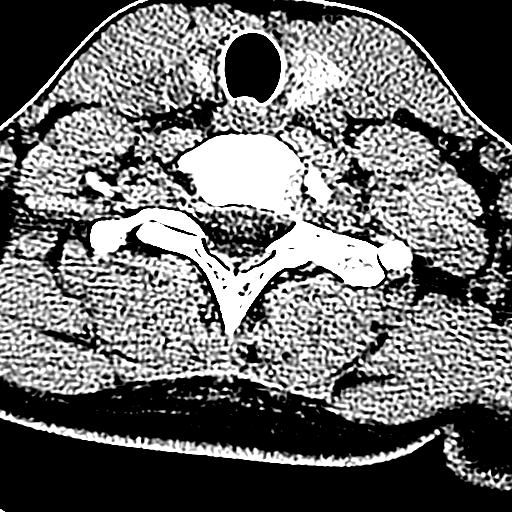
[im 40/93  brain]
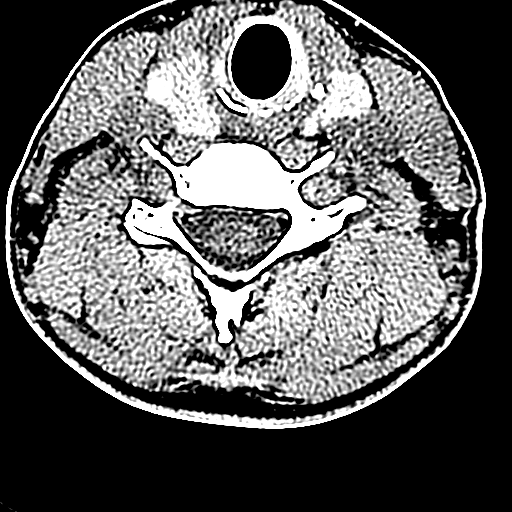
[im 53/93  brain]
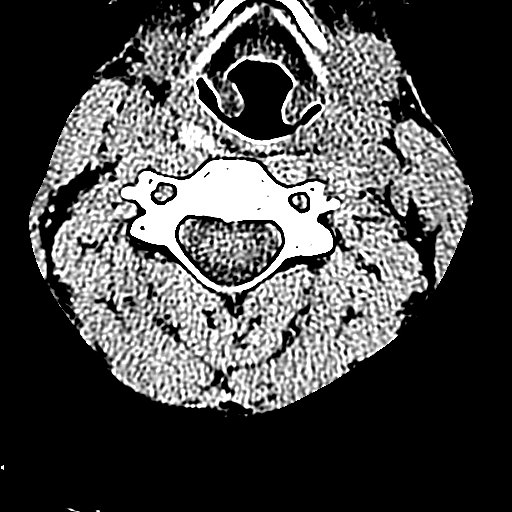
[im 66/93  brain]
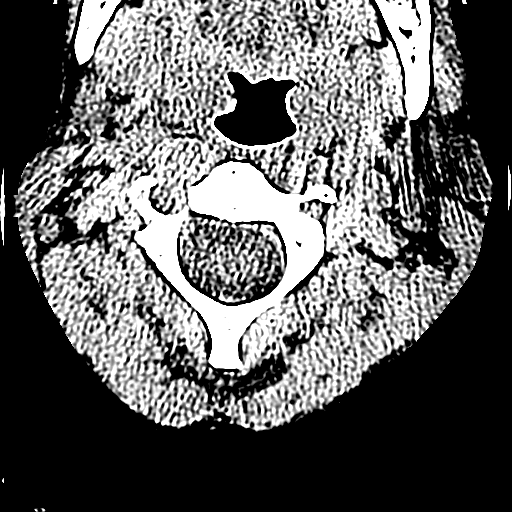
[im 66/93  bone]
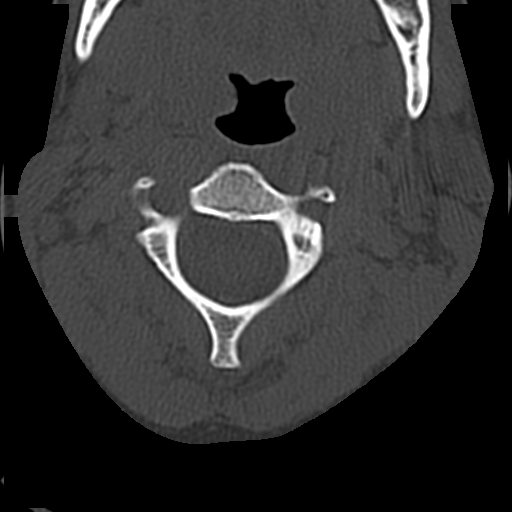
[im 79/93  brain]
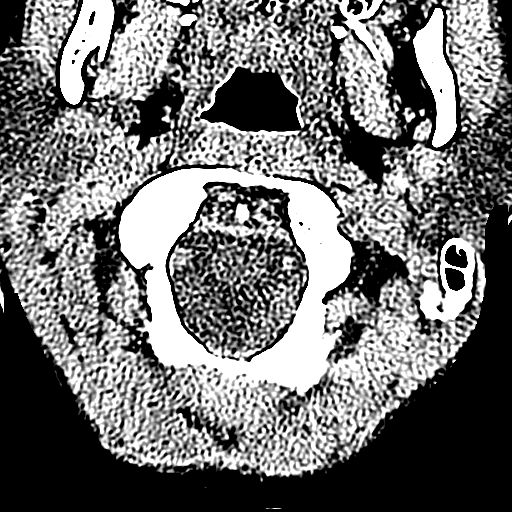

[17 of 47 positions shown; findings below may reference images not displayed]

FINDINGS: CT HEAD FINDINGS

There is no intracranial hemorrhage, mass or evidence of acute
infarction. There is no extra-axial fluid collection. Gray matter
and white matter appear normal. Cerebral volume is normal for age.
Brainstem and posterior fossa are unremarkable. The CSF spaces
appear normal.

The bony structures are intact. The visible portions of the
paranasal sinuses are clear.

CT MAXILLOFACIAL FINDINGS

The nasal bones are intact. Bony orbits are intact. Orbital contents
are intact. Maxillary sinuses are intact. Zygomatic arches and
pterygoid plates are intact. Mandible and TMJ are intact. No acute
soft tissue abnormalities are evident.

CT CERVICAL SPINE FINDINGS

The vertebral column, pedicles and facet articulations are intact.
There is no evidence of acute fracture. No acute soft tissue
abnormalities are evident.

No significant arthritic changes are evident.
IMPRESSION: 1. Negative for acute intracranial traumatic injury.  Normal brain.
2. Negative for acute maxillofacial fracture.
3. Negative for acute cervical spine fracture.

## 2016-09-28 ENCOUNTER — Emergency Department (HOSPITAL_BASED_OUTPATIENT_CLINIC_OR_DEPARTMENT_OTHER)
Admission: EM | Admit: 2016-09-28 | Discharge: 2016-09-28 | Disposition: A | Discharge status: Home / Self Care | Payer: Medicaid Other | Attending: Dermatology | Admitting: Dermatology

## 2016-09-28 ENCOUNTER — Encounter (HOSPITAL_BASED_OUTPATIENT_CLINIC_OR_DEPARTMENT_OTHER): Payer: Self-pay | Admitting: Emergency Medicine

## 2016-09-28 DIAGNOSIS — J45909 Unspecified asthma, uncomplicated: Secondary | ICD-10-CM | POA: Insufficient documentation

## 2016-09-28 DIAGNOSIS — Z5321 Procedure and treatment not carried out due to patient leaving prior to being seen by health care provider: Secondary | ICD-10-CM | POA: Insufficient documentation

## 2016-09-28 DIAGNOSIS — G43909 Migraine, unspecified, not intractable, without status migrainosus: Secondary | ICD-10-CM | POA: Insufficient documentation

## 2016-09-28 NOTE — ED Notes (Signed)
Pt LWBS.  Pt advised registration personnel that she was leaving ED.

## 2016-09-28 NOTE — ED Triage Notes (Signed)
Pt reports migraine HA since last night.  Pt states pain ismilar to previous migraines she has had and took tylenol as she normally does but the pain has not resolved.  Pt states she often has a seizure when her migraines do not resolve so she came to ED prior to any seizure activity today.

## 2017-01-14 ENCOUNTER — Other Ambulatory Visit: Payer: Self-pay | Admitting: Neurology

## 2017-01-14 DIAGNOSIS — R569 Unspecified convulsions: Secondary | ICD-10-CM

## 2017-02-14 ENCOUNTER — Ambulatory Visit (HOSPITAL_COMMUNITY)
Admission: RE | Admit: 2017-02-14 | Discharge: 2017-02-14 | Disposition: A | Discharge status: Home / Self Care | Payer: Medicaid Other | Source: Ambulatory Visit | Attending: Neurology | Admitting: Neurology

## 2017-02-14 DIAGNOSIS — R569 Unspecified convulsions: Secondary | ICD-10-CM

## 2017-02-14 NOTE — Procedures (Signed)
HPI:  26 y/o with seizure like episodes  TECHNICAL SUMMARY:  A multichannel referential and bipolar montage EEG using the standard international 10-20 system was performed on the patient described as awake.  The dominant background activity consists of 9-10 hertz activity seen most prominantly over the posterior head region.  The backgound activity is reactive to eye opening and closing procedures.  Low voltage fast (beta) activity is distributed symmetrically and maximally over the anterior head regions.  ACTIVATION:  Stepwise photic stimulation at 4-20 flashes per second was performed and did not elicit any abnormal waveforms.  Hyperventilation was performed for 3 minutes with good patient effort and produced no changes in the background activity.  EPILEPTIFORM ACTIVITY:  There were no spikes, sharp waves or paroxysmal activity.  SLEEP:  No sleep was noted  CARDIAC:  The EKG lead revealed a regular rhythm.  IMPRESSION:  This is a normal EEG for the patients stated age.  There were no focal, hemispheric or lateralizing features.  No epileptiform activity was recorded.  A normal EEG does not exclude the diagnosis of a seizure disorder and if seizure remains high on the list of differential diagnosis, an ambulatory EEG may be of value.  Clinical correlation is required.

## 2017-02-14 NOTE — Progress Notes (Signed)
EEG Completed; Results Pending  

## 2017-03-31 ENCOUNTER — Encounter (HOSPITAL_BASED_OUTPATIENT_CLINIC_OR_DEPARTMENT_OTHER): Payer: Self-pay | Admitting: *Deleted

## 2017-03-31 ENCOUNTER — Emergency Department (HOSPITAL_BASED_OUTPATIENT_CLINIC_OR_DEPARTMENT_OTHER)
Admission: EM | Admit: 2017-03-31 | Discharge: 2017-03-31 | Disposition: A | Discharge status: Home / Self Care | Payer: Medicaid Other | Attending: Emergency Medicine | Admitting: Emergency Medicine

## 2017-03-31 DIAGNOSIS — T7840XA Allergy, unspecified, initial encounter: Secondary | ICD-10-CM | POA: Insufficient documentation

## 2017-03-31 DIAGNOSIS — J45909 Unspecified asthma, uncomplicated: Secondary | ICD-10-CM | POA: Diagnosis not present

## 2017-03-31 DIAGNOSIS — R21 Rash and other nonspecific skin eruption: Secondary | ICD-10-CM | POA: Diagnosis not present

## 2017-03-31 NOTE — ED Triage Notes (Signed)
Pt states she tried on new make up yesterday and also worked out in the yard. Reports her throat felt funny last night (resolved at this time) but woke up and both eye were swollen

## 2017-03-31 NOTE — ED Notes (Signed)
Pt verbalized understanding of discharge instructions and denies any further questions at this time.   

## 2017-03-31 NOTE — ED Provider Notes (Signed)
[facility] Provider Note   CSN: [id number] Arrival date & time: 03/31/17  0913     History   Chief Complaint Chief Complaint  Patient presents with  . Allergic Reaction    HPI Erin Price is a 26 y.o. female.  HPI 26 year old Hispanic female past medical history significant for asthma presents to the emergency department today with complaints of bilateral eye swelling, facial redness, bilateral itchy eyes.  patient states that she tried new makeup yesterday and worked in the yard planting some flowers. States that when she woke up this morning she noticed a rash to her bilateral face along with bilateral eye swelling and itchiness. Denies any eye drainage. She denies any difficulties breathing or difficulty swallowing. Does report some mild scratchy throat. Never had any allergies before. Denies any associated symptoms including fever, nausea, vomiting. Vision is normal.  Past Medical History:  Diagnosis Date  . Asthma   . Closed head injury   . Migraine   . Scoliosis   . Seizures [facility](HCC)    last seizure May 2018    Patient Active Problem List   Diagnosis Date Noted  . Status epilepticus (HCC) 05/19/2015  . Metabolic acidosis 05/19/2015  . Acute encephalopathy 05/19/2015  . Closed head injury   . Scoliosis   . Asthma   . Migraine   . Asthma, mild intermittent   . Seizures (HCC)     Past Surgical History:  Procedure Laterality Date  . BACK SURGERY     screws placed for scoliosis    OB History    Gravida Para Term Preterm AB Living   2 2 1 1   2    SAB TAB Ectopic Multiple Live Births           1       Home Medications    Prior to Admission medications   Medication Sig Start Date End Date Taking? Authorizing Provider  albuterol (PROVENTIL HFA;VENTOLIN HFA) 108 (90 Base) MCG/ACT inhaler Inhale 2 puffs into the lungs every 6 (six) hours as needed for wheezing or shortness of breath.    [provider]  doxycycline (VIBRAMYCIN) 100 MG  capsule Take 1 capsule (100 mg total) by mouth 2 (two) times daily. Patient not taking: Reported on 03/25/2016 02/05/16   Jerre SimonFocht, Jessica L, PA  metroNIDAZOLE (FLAGYL) 500 MG tablet Take 1 tablet (500 mg total) by mouth 2 (two) times daily. Patient not taking: Reported on 03/25/2016 02/05/16   Jerre SimonFocht, Jessica L, PA  naproxen (NAPROSYN) 500 MG tablet Take 1 tablet (500 mg total) by mouth 2 (two) times daily. Patient not taking: Reported on 04/12/2016 03/25/16   Dowless, Lelon MastSamantha Tripp, PA-C  ondansetron (ZOFRAN) 4 MG tablet Take 1 tablet (4 mg total) by mouth every 6 (six) hours. Patient not taking: Reported on 01/06/2016 12/06/15   Melton Krebsiley, Samantha Nicole, PA-C    Family History Family History  Problem Relation Age of Onset  . Hypertension Father   . Diabetes Maternal Grandmother   . Hypertension Maternal Grandmother   . Diabetes Paternal Grandmother   . Hypertension Paternal Grandmother   . Anesthesia problems Neg Hx     Social History Social History  Substance Use Topics  . Smoking status: Never Smoker  . Smokeless tobacco: Never Used  . Alcohol use No     Allergies   Patient has no known allergies.   Review of Systems Review of Systems  Constitutional: Negative for chills and fever.  HENT: Negative for congestion and  facial swelling.   Eyes: Positive for redness and itching. Negative for photophobia, pain, discharge and visual disturbance.  Respiratory: Negative for shortness of breath.   Cardiovascular: Negative for chest pain and palpitations.  Gastrointestinal: Negative for nausea and vomiting.  Skin: Positive for rash.  Neurological: Negative for dizziness, light-headedness and headaches.     Physical Exam Updated Vital Signs BP 112/72 (BP Location: Left Arm)   Pulse 91   Temp 99.2 F (37.3 C) (Oral)   Resp 16   Ht 5\' 1"  (1.549 m)   Wt 49.9 kg (110 lb)   SpO2 100%   BMI 20.78 kg/m   Physical Exam  Constitutional: She appears well-developed and well-nourished. No  distress.  HENT:  Head: Normocephalic and atraumatic.  Mouth/Throat: Oropharynx is clear and moist.  Maculopapular rash to the bilateral maxilla. Pleuritic in nature. No drainage. No fluctuance.  Oropharynx is clear. No angioedema. Speaking complete sentences and maintaining airway.  Eyes: Conjunctivae and EOM are normal. Pupils are equal, round, and reactive to light. Right eye exhibits no discharge. Left eye exhibits no discharge. No scleral icterus.  Bilateral eyelid swelling. No erythema. Itchiness. No conjunctival discharge.  Neck: Normal range of motion. Neck supple.  Pulmonary/Chest: Effort normal and breath sounds normal. No respiratory distress. She has no wheezes. She has no rales. She exhibits no tenderness.  Musculoskeletal: Normal range of motion.  Lymphadenopathy:    She has no cervical adenopathy.  Neurological: She is alert.  Skin: Skin is warm and dry. Capillary refill takes less than 2 seconds. No pallor.  Psychiatric: Her behavior is normal. Judgment and thought content normal.  Nursing note and vitals reviewed.    ED Treatments / Results  Labs (all labs ordered are listed, but only abnormal results are displayed) Labs Reviewed - No data to display  EKG  EKG Interpretation None       Radiology No results found.  Procedures Procedures (including critical care time)  Medications Ordered in ED Medications - No data to display   Initial Impression / Assessment and Plan / ED Course  I have reviewed the triage vital signs and the nursing notes.  Pertinent labs & imaging results that were available during my care of the patient were reviewed by me and considered in my medical decision making (see chart for details).     Patient presents to the ED with complaints of rash to her bilateral face, bilateral eyelid swelling, bilateral eye itching. Denies any redness, vision changes, drainage. Denies any fevers. Patient did use new makeup yesterday and has been  working in the yard. Patient's symptoms seem consistent with a contact dermatitis vs allergic conjunctivitis. No signs of orbital or periorbital cellulitis. Has no eye discharge. Doubt conjunctival abrasion or ulcer. Vision is normal. Encouraged to discontinue use of antibiotic. Encouraged her to use Benadryl and Pepcid home with a daily allergy medication. No signs of anaphylaxis. Encouraged follow-up in 2-3 days with primary care doctor if symptoms do not improve. Return to the ED if your symptoms worsen. Patient verbalized understanding the plan of care and all questions were answered prior to discharge.   Final Clinical Impressions(s) / ED Diagnoses   Final diagnoses:  Allergic reaction, initial encounter    New Prescriptions New Prescriptions   No medications on file     Wallace Keller 03/31/17 1039    Charlynne Pander, MD 03/31/17 [phone]

## 2017-03-31 NOTE — Discharge Instructions (Signed)
This is likely an allergic reaction to her makeup or environmental allergy. Please get over-the-counter Benadryl and take as prescribed. I would also get either Pepcid or Zantac over-the-counter and take once a day. May use over-the-counter allergy medication such as Zyrtec or Claritin to help with environmental allergies. Ice pack to your bilateral face and eyes. Follow up with her primary care doctor or return to the ED if your symptoms are not improving in the next 2-3 days. Return sooner if he develops any fevers, drainage from her eyes, difficulty breathing, difficulty swallowing.

## 2017-04-13 ENCOUNTER — Emergency Department (HOSPITAL_BASED_OUTPATIENT_CLINIC_OR_DEPARTMENT_OTHER): Payer: Medicaid Other

## 2017-04-13 ENCOUNTER — Encounter (HOSPITAL_BASED_OUTPATIENT_CLINIC_OR_DEPARTMENT_OTHER): Payer: Self-pay | Admitting: Emergency Medicine

## 2017-04-13 ENCOUNTER — Emergency Department (HOSPITAL_BASED_OUTPATIENT_CLINIC_OR_DEPARTMENT_OTHER)
Admission: EM | Admit: 2017-04-13 | Discharge: 2017-04-13 | Disposition: A | Discharge status: Home / Self Care | Payer: Medicaid Other | Attending: Emergency Medicine | Admitting: Emergency Medicine

## 2017-04-13 DIAGNOSIS — R1011 Right upper quadrant pain: Secondary | ICD-10-CM | POA: Diagnosis not present

## 2017-04-13 DIAGNOSIS — S301XXA Contusion of abdominal wall, initial encounter: Secondary | ICD-10-CM | POA: Diagnosis not present

## 2017-04-13 DIAGNOSIS — R109 Unspecified abdominal pain: Secondary | ICD-10-CM | POA: Insufficient documentation

## 2017-04-13 DIAGNOSIS — Y929 Unspecified place or not applicable: Secondary | ICD-10-CM | POA: Diagnosis not present

## 2017-04-13 DIAGNOSIS — J45909 Unspecified asthma, uncomplicated: Secondary | ICD-10-CM | POA: Diagnosis not present

## 2017-04-13 DIAGNOSIS — S20219A Contusion of unspecified front wall of thorax, initial encounter: Secondary | ICD-10-CM

## 2017-04-13 DIAGNOSIS — Y999 Unspecified external cause status: Secondary | ICD-10-CM | POA: Insufficient documentation

## 2017-04-13 DIAGNOSIS — S20211A Contusion of right front wall of thorax, initial encounter: Secondary | ICD-10-CM | POA: Diagnosis not present

## 2017-04-13 DIAGNOSIS — R0789 Other chest pain: Secondary | ICD-10-CM | POA: Insufficient documentation

## 2017-04-13 DIAGNOSIS — Y939 Activity, unspecified: Secondary | ICD-10-CM | POA: Diagnosis not present

## 2017-04-13 DIAGNOSIS — Z79899 Other long term (current) drug therapy: Secondary | ICD-10-CM | POA: Insufficient documentation

## 2017-04-13 DIAGNOSIS — S0990XA Unspecified injury of head, initial encounter: Secondary | ICD-10-CM | POA: Diagnosis present

## 2017-04-13 LAB — BASIC METABOLIC PANEL
Anion gap: 9 (ref 5–15)
BUN: 12 mg/dL (ref 6–20)
CO2: 25 mmol/L (ref 22–32)
Calcium: 9.3 mg/dL (ref 8.9–10.3)
Chloride: 103 mmol/L (ref 101–111)
Creatinine, Ser: 0.7 mg/dL (ref 0.44–1.00)
GFR calc Af Amer: >60 mL/min (ref >60)
GFR calc non Af Amer: >60 mL/min (ref >60)
Glucose, Bld: 93 mg/dL (ref 65–99)
Potassium: 3.5 mmol/L (ref 3.5–5.1)
Sodium: 137 mmol/L (ref 135–145)

## 2017-04-13 LAB — CBC WITH DIFFERENTIAL/PLATELET
Basophils Absolute: 0 10*3/uL (ref 0.0–0.1)
Basophils Relative: 0 %
Eosinophils Absolute: 0 10*3/uL (ref 0.0–0.7)
Eosinophils Relative: 0 %
HCT: 38.5 % (ref 36.0–46.0)
Hemoglobin: 13.5 g/dL (ref 12.0–15.0)
Lymphocytes Relative: 15 %
Lymphs Abs: 2.1 10*3/uL (ref 0.7–4.0)
MCH: 31.7 pg (ref 26.0–34.0)
MCHC: 35.1 g/dL (ref 30.0–36.0)
MCV: 90.4 fL (ref 78.0–100.0)
Monocytes Absolute: 0.8 10*3/uL (ref 0.1–1.0)
Monocytes Relative: 6 %
Neutro Abs: 11.2 10*3/uL — ABNORMAL HIGH (ref 1.7–7.7)
Neutrophils Relative %: 79 %
Platelets: 205 10*3/uL (ref 150–400)
RBC: 4.26 MIL/uL (ref 3.87–5.11)
RDW: 12.5 % (ref 11.5–15.5)
WBC: 14.2 10*3/uL — ABNORMAL HIGH (ref 4.0–10.5)

## 2017-04-13 LAB — PREGNANCY, URINE: Preg Test, Ur: NEGATIVE

## 2017-04-13 MED ORDER — IOPAMIDOL (ISOVUE-300) INJECTION 61%
100.0000 mL | Freq: Once | INTRAVENOUS | Status: AC | PRN
  Administered 2017-04-13: 100 mL via INTRAVENOUS

## 2017-04-13 MED ORDER — METAXALONE 800 MG PO TABS: 800.0000 mg | ORAL_TABLET | Freq: Three times a day (TID) | ORAL | 0 refills | Status: DC | PRN

## 2017-04-13 MED ORDER — IBUPROFEN 600 MG PO TABS: 600.0000 mg | ORAL_TABLET | Freq: Three times a day (TID) | ORAL | 0 refills | Status: DC | PRN

## 2017-04-13 MED ORDER — METHOCARBAMOL 500 MG PO TABS: 500.0000 mg | ORAL_TABLET | Freq: Three times a day (TID) | ORAL | 0 refills | Status: DC | PRN
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
## 2017-07-04 ENCOUNTER — Inpatient Hospital Stay (HOSPITAL_COMMUNITY)
Admission: AD | Admit: 2017-07-04 | Discharge: 2017-07-04 | Disposition: A | Discharge status: Home / Self Care | Payer: Medicaid Other | Source: Ambulatory Visit | Attending: Obstetrics and Gynecology | Admitting: Obstetrics and Gynecology

## 2017-07-04 DIAGNOSIS — Z5321 Procedure and treatment not carried out due to patient leaving prior to being seen by health care provider: Secondary | ICD-10-CM | POA: Diagnosis not present

## 2017-07-04 NOTE — Progress Notes (Signed)
#  3 not in lobby 

## 2017-07-04 NOTE — MAU Note (Signed)
#  2 not in lobby 

## 2017-07-04 NOTE — MAU Note (Signed)
Not in lobby

## 2017-07-26 ENCOUNTER — Emergency Department (HOSPITAL_COMMUNITY)
Admission: EM | Admit: 2017-07-26 | Discharge: 2017-07-26 | Disposition: A | Discharge status: Home / Self Care | Payer: Medicaid Other | Attending: Emergency Medicine | Admitting: Emergency Medicine

## 2017-07-26 ENCOUNTER — Emergency Department (HOSPITAL_COMMUNITY): Payer: Medicaid Other

## 2017-07-26 ENCOUNTER — Encounter (HOSPITAL_COMMUNITY): Payer: Self-pay | Admitting: Emergency Medicine

## 2017-07-26 DIAGNOSIS — J45909 Unspecified asthma, uncomplicated: Secondary | ICD-10-CM | POA: Insufficient documentation

## 2017-07-26 DIAGNOSIS — Y999 Unspecified external cause status: Secondary | ICD-10-CM | POA: Insufficient documentation

## 2017-07-26 DIAGNOSIS — Y9259 Other trade areas as the place of occurrence of the external cause: Secondary | ICD-10-CM | POA: Insufficient documentation

## 2017-07-26 DIAGNOSIS — Y939 Activity, unspecified: Secondary | ICD-10-CM | POA: Insufficient documentation

## 2017-07-26 DIAGNOSIS — S0993XA Unspecified injury of face, initial encounter: Secondary | ICD-10-CM | POA: Diagnosis present

## 2017-07-26 DIAGNOSIS — R569 Unspecified convulsions: Secondary | ICD-10-CM | POA: Diagnosis not present

## 2017-07-26 DIAGNOSIS — S0083XA Contusion of other part of head, initial encounter: Secondary | ICD-10-CM

## 2017-07-26 LAB — I-STAT BETA HCG BLOOD, ED (MC, WL, AP ONLY): I-stat hCG, quantitative: <5 m[IU]/mL (ref <5)

## 2017-07-26 MED ORDER — LEVETIRACETAM 500 MG PO TABS: 500.0000 mg | ORAL_TABLET | Freq: Two times a day (BID) | ORAL | 0 refills | Status: DC

## 2017-07-26 MED ORDER — LEVETIRACETAM 500 MG/5ML IV SOLN
1000.0000 mg | Freq: Once | INTRAVENOUS | Status: AC
  Administered 2017-07-26: 1000 mg via INTRAVENOUS
  Filled 2017-07-26: qty 10

## 2017-07-26 NOTE — ED Notes (Signed)
Patient transported to CT 
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
## 2017-07-26 NOTE — ED Notes (Signed)
Pt moved into room for comfort and privacy.  Pt reports head hurts and is sensitive to light and noise at this time.  Pt is able to change clothing independently as she had been incontinent during seizure.

## 2017-09-23 NOTE — L&D Delivery Note (Signed)
27 y.o. [id number] at [id number] who presented in active labor, received epidural for pain, no augmentation, proceeded to SVD.  Delivery Note At 10:55 AM a viable female was delivered via  (Presentation: cephalic;ROA).  APGAR:9/9.   Placenta status: delivered intact, 3VC, no complications.  Anesthesia: Epidural   Lacerations:  None Est. Blood Loss (mL): 300   Mom to postpartum.  Baby to Couplet care / Skin to Skin. Plans for breast and bottle feeding, Nexplanon for contraception  Jaynie CollinsUgonna Rayyan Burley, MD 06/05/2018, 11:10 AM

## 2017-10-22 ENCOUNTER — Inpatient Hospital Stay (HOSPITAL_COMMUNITY): Payer: Medicaid Other

## 2017-10-22 ENCOUNTER — Inpatient Hospital Stay (HOSPITAL_COMMUNITY)
Admission: AD | Admit: 2017-10-22 | Discharge: 2017-10-22 | Disposition: A | Discharge status: Home / Self Care | Payer: Medicaid Other | Source: Ambulatory Visit | Attending: Obstetrics & Gynecology | Admitting: Obstetrics & Gynecology

## 2017-10-22 ENCOUNTER — Encounter (HOSPITAL_COMMUNITY): Payer: Self-pay

## 2017-10-22 DIAGNOSIS — R42 Dizziness and giddiness: Secondary | ICD-10-CM | POA: Insufficient documentation

## 2017-10-22 DIAGNOSIS — O26891 Other specified pregnancy related conditions, first trimester: Secondary | ICD-10-CM

## 2017-10-22 DIAGNOSIS — R109 Unspecified abdominal pain: Secondary | ICD-10-CM | POA: Diagnosis not present

## 2017-10-22 DIAGNOSIS — Z3A01 Less than 8 weeks gestation of pregnancy: Secondary | ICD-10-CM | POA: Insufficient documentation

## 2017-10-22 DIAGNOSIS — O26811 Pregnancy related exhaustion and fatigue, first trimester: Secondary | ICD-10-CM | POA: Insufficient documentation

## 2017-10-22 DIAGNOSIS — Z3491 Encounter for supervision of normal pregnancy, unspecified, first trimester: Secondary | ICD-10-CM

## 2017-10-22 LAB — URINALYSIS, ROUTINE W REFLEX MICROSCOPIC
Bacteria, UA: NONE SEEN
Bilirubin Urine: NEGATIVE
Glucose, UA: NEGATIVE mg/dL
Ketones, ur: NEGATIVE mg/dL
Leukocytes, UA: NEGATIVE
Nitrite: NEGATIVE
Protein, ur: NEGATIVE mg/dL
Specific Gravity, Urine: 1.025 (ref 1.005–1.030)
pH: 6 (ref 5.0–8.0)

## 2017-10-22 LAB — CBC
HCT: 37.1 % (ref 36.0–46.0)
Hemoglobin: 12.8 g/dL (ref 12.0–15.0)
MCH: 31.4 pg (ref 26.0–34.0)
MCHC: 34.5 g/dL (ref 30.0–36.0)
MCV: 91.2 fL (ref 78.0–100.0)
Platelets: 202 10*3/uL (ref 150–400)
RBC: 4.07 MIL/uL (ref 3.87–5.11)
RDW: 12.6 % (ref 11.5–15.5)
WBC: 10.1 10*3/uL (ref 4.0–10.5)

## 2017-10-22 LAB — WET PREP, GENITAL
Clue Cells Wet Prep HPF POC: NONE SEEN
Sperm: NONE SEEN
Trich, Wet Prep: NONE SEEN
Yeast Wet Prep HPF POC: NONE SEEN

## 2017-10-22 LAB — POCT PREGNANCY, URINE: Preg Test, Ur: POSITIVE — AB

## 2017-10-22 LAB — HCG, QUANTITATIVE, PREGNANCY: hCG, Beta Chain, Quant, S: 13822 m[IU]/mL — ABNORMAL HIGH (ref <5)

## 2017-10-22 MED ORDER — PRENATAL VITAMINS 0.8 MG PO TABS: 1.0000 | ORAL_TABLET | Freq: Every day | ORAL | 12 refills | Status: DC

## 2017-10-22 NOTE — MAU Note (Signed)
Dizzy for the past 2 days  Stomach pain since yesterday- intermittent, throbbing, 7/10  Home UPT was positive today- unsure of LMP  No vaginal discharge, no vaginal bleeding
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
## 2017-10-23 LAB — GC/CHLAMYDIA PROBE AMP (~~LOC~~) NOT AT ARMC
Chlamydia: POSITIVE — AB
Neisseria Gonorrhea: NEGATIVE

## 2017-10-23 LAB — HIV ANTIBODY (ROUTINE TESTING W REFLEX): HIV Screen 4th Generation wRfx: NONREACTIVE

## 2017-10-24 ENCOUNTER — Telehealth: Payer: Self-pay | Admitting: Medical

## 2017-10-24 DIAGNOSIS — A749 Chlamydial infection, unspecified: Secondary | ICD-10-CM

## 2017-10-24 MED ORDER — AZITHROMYCIN 250 MG PO TABS: 1000.0000 mg | ORAL_TABLET | Freq: Once | ORAL | 0 refills | Status: AC

## 2017-10-24 NOTE — Telephone Encounter (Addendum)
Erin Price tested positive for  Chlamydia. Patient was called by RN and allergies and pharmacy confirmed. Rx sent to pharmacy of choice.   Kathlene CoteWenzel, Tayna Smethurst N, PA-C 10/24/2017 8:17 PM      ----- Message from Kathe BectonLori S Berdik, RN sent at 10/24/2017  9:14 AM EST ----- This patient tested positive for :  Chlamydia  She "has NKDA", I have informed the patient of her results and confirmed her pharmacy is correct in her chart. Please send Rx.   Thank you,   Kathe BectonBerdik, Lori S, RN   [facility]

## 2017-11-04 ENCOUNTER — Ambulatory Visit: Payer: Self-pay

## 2017-11-04 ENCOUNTER — Ambulatory Visit (INDEPENDENT_AMBULATORY_CARE_PROVIDER_SITE_OTHER): Payer: Medicaid Other

## 2017-11-04 DIAGNOSIS — O3680X0 Pregnancy with inconclusive fetal viability, not applicable or unspecified: Secondary | ICD-10-CM

## 2017-11-04 NOTE — Progress Notes (Signed)
Pt informed that the ultrasound is considered a limited OB ultrasound and is not intended to be a complete ultrasound exam.  Patient also informed that the ultrasound is not being completed with the intent of assessing for fetal or placental anomalies or any pelvic abnormalities.  Explained that the purpose of today's ultrasound is to assess for viability.  Patient acknowledges the purpose of the exam and the limitations of the study.    Unable to visualize IUP using abdominal transducer.  Pt to have transvaginal US scheduled

## 2017-11-04 NOTE — Progress Notes (Signed)
Patient scheduled for NOB appointment. Had ultrasound in MAU on 1/30 that showed gestational sac and yolk sac with no embryo and was supposed to have follow up u/s before appointment but never received call. D. Day RN attempted to perform transabdominal u/s but was unable to see anything. Patient denies any abdominal pain or vaginal bleeding.   Patient scheduled for transvaginal u/s tomorrow at 11am and will reschedule NOB appointment. Patient updated on plan of care and agrees.   Rolm BookbinderCaroline M Neill, CNM 11/04/17 11:14 AM

## 2017-11-05 ENCOUNTER — Ambulatory Visit (HOSPITAL_COMMUNITY)
Admission: RE | Admit: 2017-11-05 | Discharge: 2017-11-05 | Disposition: A | Discharge status: Home / Self Care | Payer: Medicaid Other | Source: Ambulatory Visit | Attending: Advanced Practice Midwife | Admitting: Advanced Practice Midwife

## 2017-11-05 ENCOUNTER — Ambulatory Visit (INDEPENDENT_AMBULATORY_CARE_PROVIDER_SITE_OTHER): Payer: Medicaid Other | Admitting: *Deleted

## 2017-11-05 DIAGNOSIS — Z3491 Encounter for supervision of normal pregnancy, unspecified, first trimester: Secondary | ICD-10-CM

## 2017-11-05 DIAGNOSIS — Z3A01 Less than 8 weeks gestation of pregnancy: Secondary | ICD-10-CM | POA: Diagnosis not present

## 2017-11-05 DIAGNOSIS — R109 Unspecified abdominal pain: Secondary | ICD-10-CM | POA: Insufficient documentation

## 2017-11-05 DIAGNOSIS — O26891 Other specified pregnancy related conditions, first trimester: Secondary | ICD-10-CM | POA: Insufficient documentation

## 2017-11-05 DIAGNOSIS — O26811 Pregnancy related exhaustion and fatigue, first trimester: Secondary | ICD-10-CM | POA: Diagnosis not present

## 2017-11-05 DIAGNOSIS — R42 Dizziness and giddiness: Secondary | ICD-10-CM | POA: Insufficient documentation

## 2017-11-05 DIAGNOSIS — O3680X0 Pregnancy with inconclusive fetal viability, not applicable or unspecified: Secondary | ICD-10-CM

## 2017-11-05 NOTE — Progress Notes (Signed)
Here for us results. Results reviewed with Vonzella NippleJulie Wenzel, PA and then informed patient us confirms live baby and we recommend she start prenatal care. Also reviewed her EDD. She has an appt for new ob later this month in our office.

## 2017-11-07 NOTE — Progress Notes (Signed)
I have reviewed the chart and agree with nursing staff's documentation of this patient's encounter.  Julie Wenzel, PA-C 11/07/2017 8:11 AM    

## 2017-11-19 ENCOUNTER — Encounter: Payer: Self-pay | Admitting: Obstetrics and Gynecology

## 2017-11-19 ENCOUNTER — Ambulatory Visit (INDEPENDENT_AMBULATORY_CARE_PROVIDER_SITE_OTHER): Payer: Medicaid Other | Admitting: Obstetrics and Gynecology

## 2017-11-19 VITALS — BP 117/72 | HR 72 | Wt 111.2 lb

## 2017-11-19 DIAGNOSIS — O09899 Supervision of other high risk pregnancies, unspecified trimester: Secondary | ICD-10-CM

## 2017-11-19 DIAGNOSIS — O98819 Other maternal infectious and parasitic diseases complicating pregnancy, unspecified trimester: Secondary | ICD-10-CM

## 2017-11-19 DIAGNOSIS — R569 Unspecified convulsions: Secondary | ICD-10-CM

## 2017-11-19 DIAGNOSIS — F1911 Other psychoactive substance abuse, in remission: Secondary | ICD-10-CM

## 2017-11-19 DIAGNOSIS — O09891 Supervision of other high risk pregnancies, first trimester: Secondary | ICD-10-CM | POA: Diagnosis not present

## 2017-11-19 DIAGNOSIS — O09219 Supervision of pregnancy with history of pre-term labor, unspecified trimester: Secondary | ICD-10-CM

## 2017-11-19 DIAGNOSIS — O219 Vomiting of pregnancy, unspecified: Secondary | ICD-10-CM

## 2017-11-19 DIAGNOSIS — A749 Chlamydial infection, unspecified: Secondary | ICD-10-CM | POA: Insufficient documentation

## 2017-11-19 DIAGNOSIS — O98811 Other maternal infectious and parasitic diseases complicating pregnancy, first trimester: Secondary | ICD-10-CM

## 2017-11-19 DIAGNOSIS — O09211 Supervision of pregnancy with history of pre-term labor, first trimester: Secondary | ICD-10-CM

## 2017-11-19 LAB — POCT URINALYSIS DIP (DEVICE)
Glucose, UA: NEGATIVE mg/dL
Nitrite: NEGATIVE
Protein, ur: NEGATIVE mg/dL
Specific Gravity, Urine: >=1.03 (ref 1.005–1.030)
Urobilinogen, UA: 1 mg/dL (ref 0.0–1.0)
pH: 5.5 (ref 5.0–8.0)

## 2017-11-19 MED ORDER — PROMETHAZINE HCL 25 MG PO TABS: 25.0000 mg | ORAL_TABLET | Freq: Four times a day (QID) | ORAL | 0 refills | Status: DC | PRN

## 2017-11-19 MED ORDER — DOXYLAMINE-PYRIDOXINE 10-10 MG PO TBEC: DELAYED_RELEASE_TABLET | ORAL | 6 refills | Status: DC
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
## 2017-11-19 NOTE — Addendum Note (Signed)
Addended by: Mikey BussingWILSON, CHIQUITA L on: 11/19/2017 05:09 PM   Modules accepted: Orders

## 2017-11-19 NOTE — Progress Notes (Signed)
[facility] application form was filled out and faxed on 11/19/17.

## 2017-11-21 LAB — URINE CULTURE, OB REFLEX

## 2017-11-21 LAB — CULTURE, OB URINE

## 2017-11-24 ENCOUNTER — Encounter: Payer: Self-pay | Admitting: *Deleted

## 2017-11-24 LAB — DRUG SCREEN 764883 11+OXYCO+ALC+CRT-BUND
Amphetamines, Urine: NEGATIVE ng/mL
BENZODIAZ UR QL: NEGATIVE ng/mL
Barbiturate: NEGATIVE ng/mL
Cocaine (Metabolite): NEGATIVE ng/mL
Creatinine: 172.6 mg/dL (ref 20.0–300.0)
Ethanol: NEGATIVE %
Meperidine: NEGATIVE ng/mL
Methadone Screen, Urine: NEGATIVE ng/mL
OPIATE SCREEN URINE: NEGATIVE ng/mL
Oxycodone/Oxymorphone, Urine: NEGATIVE ng/mL
Phencyclidine: NEGATIVE ng/mL
Propoxyphene: NEGATIVE ng/mL
Tramadol: NEGATIVE ng/mL
pH, Urine: 5.5 (ref 4.5–8.9)

## 2017-11-24 LAB — CANNABINOID CONFIRMATION, UR
CANNABINOIDS: POSITIVE — AB
Carboxy THC GC/MS Conf: 181 ng/mL

## 2017-11-26 LAB — OBSTETRIC PANEL, INCLUDING HIV
Antibody Screen: NEGATIVE
Basophils Absolute: 0 10*3/uL (ref 0.0–0.2)
Basos: 0 %
EOS (ABSOLUTE): 0.1 10*3/uL (ref 0.0–0.4)
Eos: 1 %
HIV Screen 4th Generation wRfx: NONREACTIVE
Hematocrit: 35.9 % (ref 34.0–46.6)
Hemoglobin: 12.3 g/dL (ref 11.1–15.9)
Hepatitis B Surface Ag: NEGATIVE
Immature Grans (Abs): 0 10*3/uL (ref 0.0–0.1)
Immature Granulocytes: 0 %
Lymphocytes Absolute: 1.9 10*3/uL (ref 0.7–3.1)
Lymphs: 25 %
MCH: 31.8 pg (ref 26.6–33.0)
MCHC: 34.3 g/dL (ref 31.5–35.7)
MCV: 93 fL (ref 79–97)
Monocytes Absolute: 0.5 10*3/uL (ref 0.1–0.9)
Monocytes: 7 %
Neutrophils Absolute: 4.9 10*3/uL (ref 1.4–7.0)
Neutrophils: 67 %
Platelets: 208 10*3/uL (ref 150–379)
RBC: 3.87 x10E6/uL (ref 3.77–5.28)
RDW: 12.9 % (ref 12.3–15.4)
RPR Ser Ql: NONREACTIVE
Rh Factor: POSITIVE
Rubella Antibodies, IGG: 1.32 index (ref >0.99)
WBC: 7.4 10*3/uL (ref 3.4–10.8)

## 2017-11-26 LAB — SMN1 COPY NUMBER ANALYSIS (SMA CARRIER SCREENING)

## 2017-11-26 LAB — CYSTIC FIBROSIS GENE TEST

## 2017-12-03 ENCOUNTER — Encounter: Payer: Medicaid Other | Admitting: Obstetrics and Gynecology

## 2017-12-24 ENCOUNTER — Encounter: Payer: Self-pay | Admitting: *Deleted

## 2017-12-29 ENCOUNTER — Encounter: Payer: Self-pay | Admitting: *Deleted

## 2017-12-29 ENCOUNTER — Encounter: Payer: Self-pay | Admitting: Obstetrics & Gynecology

## 2017-12-29 ENCOUNTER — Ambulatory Visit (INDEPENDENT_AMBULATORY_CARE_PROVIDER_SITE_OTHER): Payer: Medicaid Other | Admitting: Obstetrics & Gynecology

## 2017-12-29 VITALS — BP 105/63 | HR 101 | Wt 118.6 lb

## 2017-12-29 DIAGNOSIS — R569 Unspecified convulsions: Secondary | ICD-10-CM

## 2017-12-29 DIAGNOSIS — Z87898 Personal history of other specified conditions: Secondary | ICD-10-CM

## 2017-12-29 DIAGNOSIS — O09899 Supervision of other high risk pregnancies, unspecified trimester: Secondary | ICD-10-CM

## 2017-12-29 DIAGNOSIS — F1911 Other psychoactive substance abuse, in remission: Secondary | ICD-10-CM

## 2017-12-29 DIAGNOSIS — O09219 Supervision of pregnancy with history of pre-term labor, unspecified trimester: Secondary | ICD-10-CM

## 2017-12-29 MED ORDER — HYDROXYPROGESTERONE CAPROATE 275 MG/1.1ML ~~LOC~~ SOAJ
275.0000 mg | Freq: Once | SUBCUTANEOUS | Status: AC
  Administered 2018-01-06: 275 mg via SUBCUTANEOUS

## 2017-12-29 NOTE — Progress Notes (Signed)
   PRENATAL VISIT NOTE  Subjective:  Erin Price is a 27 y.o. G3P1102 at 2736w1d being seen today for ongoing prenatal care.  She is currently monitored for the following issues for this high-risk pregnancy and has Scoliosis; Migraine; Asthma, mild intermittent; Seizures (HCC); History of preterm delivery, currently pregnant; History of substance abuse; Supervision of other high risk pregnancy, antepartum; and Chlamydia infection affecting pregnancy on their problem list.  Patient reports no complaints.  Contractions: Regular. Vag. Bleeding: None.  Movement: Absent. Denies leaking of fluid.   The following portions of the patient's history were reviewed and updated as appropriate: allergies, current medications, past family history, past medical history, past social history, past surgical history and problem list. Problem list updated.  Objective:   Vitals:   12/29/17 1339  BP: 105/63  Pulse: (!) 101  Weight: 118 lb 9.6 oz (53.8 kg)    Fetal Status: Fetal Heart Rate (bpm): 153   Movement: Absent     General:  Alert, oriented and cooperative. Patient is in no acute distress.  Skin: Skin is warm and dry. No rash noted.   Cardiovascular: Normal heart rate noted  Respiratory: Normal respiratory effort, no problems with respiration noted  Abdomen: Soft, gravid, appropriate for gestational age.  Pain/Pressure: Absent     Pelvic: Cervical exam deferred        Extremities: Normal range of motion.  Edema: None  Mental Status: Normal mood and affect. Normal behavior. Normal judgment and thought content.   Assessment and Plan:  Pregnancy: G3P1102 at 2736w1d  1. Supervision of other high risk pregnancy, antepartum  - US MFM OB DETAIL +14 WK; Future - AFP, Serum, Open Spina Bifida  2. History of preterm delivery, currently pregnant  - US MFM OB DETAIL +14 WK; Future - HYDROXYprogesterone Caproate SOAJ 275 mg  3. Seizures (HCC)  - US MFM OB DETAIL +14 WK; Future  4. History of  substance abuse  - US MFM OB DETAIL +14 WK; Future  Preterm labor symptoms and general obstetric precautions including but not limited to vaginal bleeding, contractions, leaking of fluid and fetal movement were reviewed in detail with the patient. Please refer to After Visit Summary for other counseling recommendations.  Return in about 1 month (around 01/26/2018) for sstart 17 P in 1 week.  Future Appointments  Date Time Provider Department Center  01/26/2018  8:00 AM [facility] US 3 [facility]    Scheryl DarterJames Krishav Mamone, MD

## 2017-12-29 NOTE — Patient Instructions (Signed)
Second Trimester of Pregnancy The second trimester is from week 13 through week 28, month 4 through 6. This is often the time in pregnancy that you feel your best. Often times, morning sickness has lessened or quit. You may have more energy, and you may get hungry more often. Your unborn baby (fetus) is growing rapidly. At the end of the sixth month, he or she is about 9 inches long and weighs about 1 pounds. You will likely feel the baby move (quickening) between 18 and 20 weeks of pregnancy. Follow these instructions at home:  Avoid all smoking, herbs, and alcohol. Avoid drugs not approved by your doctor.  Do not use any tobacco products, including cigarettes, chewing tobacco, and electronic cigarettes. If you need help quitting, ask your doctor. You may get counseling or other support to help you quit.  Only take medicine as told by your doctor. Some medicines are safe and some are not during pregnancy.  Exercise only as told by your doctor. Stop exercising if you start having cramps.  Eat regular, healthy meals.  Wear a good support bra if your breasts are tender.  Do not use hot tubs, steam rooms, or saunas.  Wear your seat belt when driving.  Avoid raw meat, uncooked cheese, and liter boxes and soil used by cats.  Take your prenatal vitamins.  Take 1500-2000 milligrams of calcium daily starting at the 20th week of pregnancy until you deliver your baby.  Try taking medicine that helps you poop (stool softener) as needed, and if your doctor approves. Eat more fiber by eating fresh fruit, vegetables, and whole grains. Drink enough fluids to keep your pee (urine) clear or pale yellow.  Take warm water baths (sitz baths) to soothe pain or discomfort caused by hemorrhoids. Use hemorrhoid cream if your doctor approves.  If you have puffy, bulging veins (varicose veins), wear support hose. Raise (elevate) your feet for 15 minutes, 3-4 times a day. Limit salt in your diet.  Avoid heavy  lifting, wear low heals, and sit up straight.  Rest with your legs raised if you have leg cramps or low back pain.  Visit your dentist if you have not gone during your pregnancy. Use a soft toothbrush to brush your teeth. Be gentle when you floss.  You can have sex (intercourse) unless your doctor tells you not to.  Go to your doctor visits. Get help if:  You feel dizzy.  You have mild cramps or pressure in your lower belly (abdomen).  You have a nagging pain in your belly area.  You continue to feel sick to your stomach (nauseous), throw up (vomit), or have watery poop (diarrhea).  You have bad smelling fluid coming from your vagina.  You have pain with peeing (urination). Get help right away if:  You have a fever.  You are leaking fluid from your vagina.  You have spotting or bleeding from your vagina.  You have severe belly cramping or pain.  You lose or gain weight rapidly.  You have trouble catching your breath and have chest pain.  You notice sudden or extreme puffiness (swelling) of your face, hands, ankles, feet, or legs.  You have not felt the baby move in over an hour.  You have severe headaches that do not go away with medicine.  You have vision changes. This information is not intended to replace advice given to you by your health care provider. Make sure you discuss any questions you have with your health care   provider. Document Released: 12/04/2009 Document Revised: 02/15/2016 Document Reviewed: 11/10/2012 Elsevier Interactive Patient Education  2017 Elsevier Inc.  

## 2017-12-30 ENCOUNTER — Encounter: Payer: Self-pay | Admitting: Family Medicine

## 2017-12-31 LAB — AFP, SERUM, OPEN SPINA BIFIDA
AFP MoM: 1.14
AFP Value: 35.7 ng/mL
Gest. Age on Collection Date: 15.1 weeks
Maternal Age At EDD: 26.9 yr
OSBR Risk 1 IN: 7850
Test Results:: NEGATIVE
Weight: 118 [lb_av]

## 2018-01-05 ENCOUNTER — Ambulatory Visit: Payer: Medicaid Other

## 2018-01-06 ENCOUNTER — Ambulatory Visit (INDEPENDENT_AMBULATORY_CARE_PROVIDER_SITE_OTHER): Payer: Medicaid Other | Admitting: *Deleted

## 2018-01-06 VITALS — BP 101/57 | HR 79

## 2018-01-06 DIAGNOSIS — O09212 Supervision of pregnancy with history of pre-term labor, second trimester: Secondary | ICD-10-CM | POA: Diagnosis present

## 2018-01-06 DIAGNOSIS — O09219 Supervision of pregnancy with history of pre-term labor, unspecified trimester: Principal | ICD-10-CM

## 2018-01-06 DIAGNOSIS — O09899 Supervision of other high risk pregnancies, unspecified trimester: Secondary | ICD-10-CM

## 2018-01-06 NOTE — Progress Notes (Signed)
Erin Price here for 17-P  Injection.  Injection administered without complication. Patient will return in one week for next injection.  Garret ReddishBarnes, Luby Seamans M, RN 01/06/2018  8:36 AM

## 2018-01-08 ENCOUNTER — Encounter: Payer: Self-pay | Admitting: *Deleted

## 2018-01-08 NOTE — Progress Notes (Signed)
I was present at the time of this nurse visit.  Agree with nursing documentation.   Alandra Sando P. Khyrie Masi, MD OB Fellow   

## 2018-01-12 ENCOUNTER — Ambulatory Visit (INDEPENDENT_AMBULATORY_CARE_PROVIDER_SITE_OTHER): Payer: Medicaid Other | Admitting: *Deleted

## 2018-01-12 VITALS — BP 103/60 | HR 87 | Ht 61.0 in | Wt 121.3 lb

## 2018-01-12 DIAGNOSIS — O09212 Supervision of pregnancy with history of pre-term labor, second trimester: Secondary | ICD-10-CM | POA: Diagnosis not present

## 2018-01-12 DIAGNOSIS — O09899 Supervision of other high risk pregnancies, unspecified trimester: Secondary | ICD-10-CM

## 2018-01-12 DIAGNOSIS — O09219 Supervision of pregnancy with history of pre-term labor, unspecified trimester: Principal | ICD-10-CM

## 2018-01-12 MED ORDER — HYDROXYPROGESTERONE CAPROATE 275 MG/1.1ML ~~LOC~~ SOAJ
275.0000 mg | Freq: Once | SUBCUTANEOUS | Status: AC
  Administered 2018-01-12: 275 mg via SUBCUTANEOUS

## 2018-01-12 NOTE — Progress Notes (Signed)
Makena 275 mg administered as scheduled. Pt doing well and has no c/o.

## 2018-01-13 NOTE — Progress Notes (Signed)
I have reviewed the chart and agree with nursing staff's documentation of this patient's encounter.  [place] Bingharlie Allea Kassner, MD 01/13/2018 8:32 AM

## 2018-01-19 ENCOUNTER — Ambulatory Visit (INDEPENDENT_AMBULATORY_CARE_PROVIDER_SITE_OTHER): Payer: Medicaid Other | Admitting: General Practice

## 2018-01-19 VITALS — BP 108/59 | HR 99 | Ht 64.0 in | Wt 122.0 lb

## 2018-01-19 DIAGNOSIS — O09212 Supervision of pregnancy with history of pre-term labor, second trimester: Secondary | ICD-10-CM | POA: Diagnosis not present

## 2018-01-19 DIAGNOSIS — O09899 Supervision of other high risk pregnancies, unspecified trimester: Secondary | ICD-10-CM

## 2018-01-19 DIAGNOSIS — O09219 Supervision of pregnancy with history of pre-term labor, unspecified trimester: Principal | ICD-10-CM

## 2018-01-19 MED ORDER — HYDROXYPROGESTERONE CAPROATE 275 MG/1.1ML ~~LOC~~ SOAJ
275.0000 mg | Freq: Once | SUBCUTANEOUS | Status: AC
  Administered 2018-01-19: 275 mg via SUBCUTANEOUS

## 2018-01-19 NOTE — Progress Notes (Signed)
I have reviewed this chart and agree with the RN/CMA assessment and management.    Emmogene Simson C Nehemie Casserly, MD, FACOG Attending Physician, Faculty Practice [facility]  

## 2018-01-19 NOTE — Progress Notes (Signed)
Erin Price here for 17-P  Injection.  Injection administered without complication. Patient will return in one week for next injection.  [name], RN 01/19/2018  8:46 AM

## 2018-01-26 ENCOUNTER — Other Ambulatory Visit (HOSPITAL_COMMUNITY)
Admission: RE | Admit: 2018-01-26 | Discharge: 2018-01-26 | Disposition: A | Discharge status: Home / Self Care | Payer: Medicaid Other | Source: Ambulatory Visit | Attending: Family Medicine | Admitting: Family Medicine

## 2018-01-26 ENCOUNTER — Other Ambulatory Visit: Payer: Self-pay | Admitting: Obstetrics & Gynecology

## 2018-01-26 ENCOUNTER — Other Ambulatory Visit (HOSPITAL_COMMUNITY): Payer: Self-pay | Admitting: *Deleted

## 2018-01-26 ENCOUNTER — Ambulatory Visit (HOSPITAL_COMMUNITY)
Admission: RE | Admit: 2018-01-26 | Discharge: 2018-01-26 | Disposition: A | Discharge status: Home / Self Care | Payer: Medicaid Other | Source: Ambulatory Visit | Attending: Obstetrics & Gynecology | Admitting: Obstetrics & Gynecology

## 2018-01-26 ENCOUNTER — Ambulatory Visit (INDEPENDENT_AMBULATORY_CARE_PROVIDER_SITE_OTHER): Payer: Medicaid Other | Admitting: Family Medicine

## 2018-01-26 ENCOUNTER — Encounter (HOSPITAL_COMMUNITY): Payer: Self-pay

## 2018-01-26 VITALS — BP 110/60 | HR 97 | Wt 121.7 lb

## 2018-01-26 DIAGNOSIS — F1911 Other psychoactive substance abuse, in remission: Secondary | ICD-10-CM

## 2018-01-26 DIAGNOSIS — A749 Chlamydial infection, unspecified: Secondary | ICD-10-CM | POA: Insufficient documentation

## 2018-01-26 DIAGNOSIS — O09212 Supervision of pregnancy with history of pre-term labor, second trimester: Secondary | ICD-10-CM | POA: Diagnosis not present

## 2018-01-26 DIAGNOSIS — O09219 Supervision of pregnancy with history of pre-term labor, unspecified trimester: Secondary | ICD-10-CM

## 2018-01-26 DIAGNOSIS — O98812 Other maternal infectious and parasitic diseases complicating pregnancy, second trimester: Secondary | ICD-10-CM

## 2018-01-26 DIAGNOSIS — Z3A19 19 weeks gestation of pregnancy: Secondary | ICD-10-CM | POA: Insufficient documentation

## 2018-01-26 DIAGNOSIS — O09892 Supervision of other high risk pregnancies, second trimester: Secondary | ICD-10-CM | POA: Diagnosis not present

## 2018-01-26 DIAGNOSIS — G40909 Epilepsy, unspecified, not intractable, without status epilepticus: Secondary | ICD-10-CM | POA: Diagnosis not present

## 2018-01-26 DIAGNOSIS — F1910 Other psychoactive substance abuse, uncomplicated: Secondary | ICD-10-CM | POA: Insufficient documentation

## 2018-01-26 DIAGNOSIS — O98811 Other maternal infectious and parasitic diseases complicating pregnancy, first trimester: Secondary | ICD-10-CM

## 2018-01-26 DIAGNOSIS — R569 Unspecified convulsions: Secondary | ICD-10-CM

## 2018-01-26 DIAGNOSIS — O09899 Supervision of other high risk pregnancies, unspecified trimester: Secondary | ICD-10-CM

## 2018-01-26 DIAGNOSIS — O99322 Drug use complicating pregnancy, second trimester: Secondary | ICD-10-CM | POA: Diagnosis not present

## 2018-01-26 DIAGNOSIS — O99352 Diseases of the nervous system complicating pregnancy, second trimester: Secondary | ICD-10-CM | POA: Diagnosis not present

## 2018-01-26 DIAGNOSIS — Z362 Encounter for other antenatal screening follow-up: Secondary | ICD-10-CM

## 2018-01-26 MED ORDER — HYDROXYPROGESTERONE CAPROATE 275 MG/1.1ML ~~LOC~~ SOAJ
275.0000 mg | Freq: Once | SUBCUTANEOUS | Status: AC
  Administered 2018-01-26: 275 mg via SUBCUTANEOUS

## 2018-01-26 NOTE — Progress Notes (Signed)
Re-Ordered Makena on 01/26/18,states should rcve by next week.
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
## 2018-01-26 NOTE — Progress Notes (Signed)
maken 

## 2018-01-27 LAB — CERVICOVAGINAL ANCILLARY ONLY
Chlamydia: NEGATIVE
Neisseria Gonorrhea: NEGATIVE

## 2018-02-02 ENCOUNTER — Ambulatory Visit (INDEPENDENT_AMBULATORY_CARE_PROVIDER_SITE_OTHER): Payer: Medicaid Other | Admitting: *Deleted

## 2018-02-02 VITALS — BP 100/52 | HR 62

## 2018-02-02 DIAGNOSIS — O09212 Supervision of pregnancy with history of pre-term labor, second trimester: Secondary | ICD-10-CM

## 2018-02-02 DIAGNOSIS — O09219 Supervision of pregnancy with history of pre-term labor, unspecified trimester: Secondary | ICD-10-CM

## 2018-02-02 DIAGNOSIS — O09899 Supervision of other high risk pregnancies, unspecified trimester: Secondary | ICD-10-CM

## 2018-02-02 MED ORDER — HYDROXYPROGESTERONE CAPROATE 275 MG/1.1ML ~~LOC~~ SOAJ
275.0000 mg | SUBCUTANEOUS | Status: AC
  Administered 2018-02-02 – 2018-05-18 (×14): 275 mg via SUBCUTANEOUS

## 2018-02-02 NOTE — Progress Notes (Signed)
Patient seen and assessed by nursing staff.  Agree with documentation and plan.  

## 2018-02-09 ENCOUNTER — Ambulatory Visit (INDEPENDENT_AMBULATORY_CARE_PROVIDER_SITE_OTHER): Payer: Medicaid Other | Admitting: General Practice

## 2018-02-09 ENCOUNTER — Encounter: Payer: Self-pay | Admitting: General Practice

## 2018-02-09 VITALS — BP 111/60 | HR 89 | Ht 61.0 in | Wt 125.0 lb

## 2018-02-09 DIAGNOSIS — O09219 Supervision of pregnancy with history of pre-term labor, unspecified trimester: Secondary | ICD-10-CM

## 2018-02-09 DIAGNOSIS — O09899 Supervision of other high risk pregnancies, unspecified trimester: Secondary | ICD-10-CM

## 2018-02-09 DIAGNOSIS — O09212 Supervision of pregnancy with history of pre-term labor, second trimester: Secondary | ICD-10-CM

## 2018-02-09 NOTE — Progress Notes (Signed)
Patient seen and assessed by nursing staff.  Agree with documentation and plan.  

## 2018-02-09 NOTE — Progress Notes (Signed)
Erin Price here for 17-P  Injection.  Injection administered without complication. Patient will return in one week for next injection.  [name], RN 02/09/2018  9:26 AM

## 2018-02-18 ENCOUNTER — Ambulatory Visit: Payer: Medicaid Other

## 2018-02-19 ENCOUNTER — Encounter: Payer: Self-pay | Admitting: Family Medicine

## 2018-02-19 ENCOUNTER — Ambulatory Visit (INDEPENDENT_AMBULATORY_CARE_PROVIDER_SITE_OTHER): Payer: Medicaid Other | Admitting: General Practice

## 2018-02-19 VITALS — BP 106/58 | HR 101 | Ht 61.0 in | Wt 128.0 lb

## 2018-02-19 DIAGNOSIS — O09212 Supervision of pregnancy with history of pre-term labor, second trimester: Secondary | ICD-10-CM | POA: Diagnosis present

## 2018-02-19 DIAGNOSIS — O09899 Supervision of other high risk pregnancies, unspecified trimester: Secondary | ICD-10-CM

## 2018-02-19 DIAGNOSIS — O09219 Supervision of pregnancy with history of pre-term labor, unspecified trimester: Principal | ICD-10-CM

## 2018-02-19 NOTE — Progress Notes (Signed)
Erin Price here for 17-P  Injection.  Injection administered without complication. Patient will return in one week for next injection.  [name], RN 02/19/2018  9:49 AM

## 2018-02-20 NOTE — Progress Notes (Signed)
I have reviewed this chart and agree with the RN/CMA assessment and management.    Erin Price C Erin Wintle, MD, FACOG Attending Physician, Faculty Practice [facility]  

## 2018-02-23 ENCOUNTER — Ambulatory Visit (HOSPITAL_COMMUNITY)
Admission: RE | Admit: 2018-02-23 | Discharge: 2018-02-23 | Disposition: A | Discharge status: Home / Self Care | Payer: Medicaid Other | Source: Ambulatory Visit | Attending: Obstetrics & Gynecology | Admitting: Obstetrics & Gynecology

## 2018-02-26 ENCOUNTER — Telehealth: Payer: Self-pay

## 2018-02-26 ENCOUNTER — Ambulatory Visit (INDEPENDENT_AMBULATORY_CARE_PROVIDER_SITE_OTHER): Payer: Medicaid Other | Admitting: Obstetrics and Gynecology

## 2018-02-26 VITALS — BP 119/66 | HR 97 | Wt 130.2 lb

## 2018-02-26 DIAGNOSIS — O09219 Supervision of pregnancy with history of pre-term labor, unspecified trimester: Secondary | ICD-10-CM

## 2018-02-26 DIAGNOSIS — O09899 Supervision of other high risk pregnancies, unspecified trimester: Secondary | ICD-10-CM

## 2018-02-26 DIAGNOSIS — R569 Unspecified convulsions: Secondary | ICD-10-CM

## 2018-02-26 NOTE — Telephone Encounter (Signed)
Called Makena [phone] regarding patients 17P medication.  Per pharmacy the 05/30 order was delivered 05/31 to the front office.  Will review the office and follow up.
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
## 2018-03-06 ENCOUNTER — Other Ambulatory Visit: Payer: Self-pay

## 2018-03-06 ENCOUNTER — Other Ambulatory Visit (HOSPITAL_COMMUNITY): Payer: Self-pay | Admitting: Obstetrics and Gynecology

## 2018-03-06 ENCOUNTER — Encounter (HOSPITAL_COMMUNITY): Payer: Self-pay

## 2018-03-06 ENCOUNTER — Encounter: Payer: Self-pay | Admitting: Obstetrics and Gynecology

## 2018-03-06 ENCOUNTER — Ambulatory Visit (HOSPITAL_COMMUNITY)
Admission: RE | Admit: 2018-03-06 | Discharge: 2018-03-06 | Disposition: A | Discharge status: Home / Self Care | Payer: Medicaid Other | Source: Ambulatory Visit | Attending: Obstetrics and Gynecology | Admitting: Obstetrics and Gynecology

## 2018-03-06 ENCOUNTER — Ambulatory Visit (HOSPITAL_COMMUNITY)
Admission: RE | Admit: 2018-03-06 | Discharge: 2018-03-06 | Disposition: A | Discharge status: Home / Self Care | Payer: Medicaid Other | Source: Ambulatory Visit | Attending: Obstetrics & Gynecology | Admitting: Obstetrics & Gynecology

## 2018-03-06 ENCOUNTER — Ambulatory Visit (INDEPENDENT_AMBULATORY_CARE_PROVIDER_SITE_OTHER): Payer: Medicaid Other

## 2018-03-06 DIAGNOSIS — O09212 Supervision of pregnancy with history of pre-term labor, second trimester: Secondary | ICD-10-CM

## 2018-03-06 DIAGNOSIS — G40909 Epilepsy, unspecified, not intractable, without status epilepticus: Secondary | ICD-10-CM | POA: Diagnosis not present

## 2018-03-06 DIAGNOSIS — O99322 Drug use complicating pregnancy, second trimester: Secondary | ICD-10-CM

## 2018-03-06 DIAGNOSIS — O99352 Diseases of the nervous system complicating pregnancy, second trimester: Secondary | ICD-10-CM

## 2018-03-06 DIAGNOSIS — O3432 Maternal care for cervical incompetence, second trimester: Secondary | ICD-10-CM

## 2018-03-06 DIAGNOSIS — F1920 Other psychoactive substance dependence, uncomplicated: Secondary | ICD-10-CM

## 2018-03-06 DIAGNOSIS — Z3A24 24 weeks gestation of pregnancy: Secondary | ICD-10-CM | POA: Diagnosis not present

## 2018-03-06 DIAGNOSIS — O09892 Supervision of other high risk pregnancies, second trimester: Secondary | ICD-10-CM

## 2018-03-06 DIAGNOSIS — Z362 Encounter for other antenatal screening follow-up: Secondary | ICD-10-CM | POA: Insufficient documentation

## 2018-03-06 DIAGNOSIS — Z3686 Encounter for antenatal screening for cervical length: Secondary | ICD-10-CM

## 2018-03-06 DIAGNOSIS — O09219 Supervision of pregnancy with history of pre-term labor, unspecified trimester: Principal | ICD-10-CM

## 2018-03-06 DIAGNOSIS — O09899 Supervision of other high risk pregnancies, unspecified trimester: Secondary | ICD-10-CM

## 2018-03-06 MED ORDER — BETAMETHASONE SOD PHOS & ACET 6 (3-3) MG/ML IJ SUSP
12.0000 mg | Freq: Once | INTRAMUSCULAR | Status: AC
Start: 2018-03-06 — End: 2018-03-06
  Administered 2018-03-06: 12 mg via INTRAMUSCULAR
  Filled 2018-03-06: qty 2

## 2018-03-07 ENCOUNTER — Inpatient Hospital Stay (HOSPITAL_COMMUNITY)
Admission: AD | Admit: 2018-03-07 | Discharge: 2018-03-07 | Disposition: A | Discharge status: Home / Self Care | Payer: Medicaid Other | Source: Ambulatory Visit | Attending: Obstetrics and Gynecology | Admitting: Obstetrics and Gynecology

## 2018-03-07 DIAGNOSIS — O26872 Cervical shortening, second trimester: Secondary | ICD-10-CM | POA: Diagnosis present

## 2018-03-07 DIAGNOSIS — Z3A24 24 weeks gestation of pregnancy: Secondary | ICD-10-CM | POA: Insufficient documentation

## 2018-03-07 MED ORDER — BETAMETHASONE SOD PHOS & ACET 6 (3-3) MG/ML IJ SUSP
12.0000 mg | Freq: Once | INTRAMUSCULAR | Status: AC
  Administered 2018-03-07: 12 mg via INTRAMUSCULAR
  Filled 2018-03-07: qty 2

## 2018-03-07 NOTE — MAU Note (Signed)
Pt presents for 2nd dose of BMZ, Denies any VB or contractions

## 2018-03-13 ENCOUNTER — Ambulatory Visit (INDEPENDENT_AMBULATORY_CARE_PROVIDER_SITE_OTHER): Payer: Medicaid Other | Admitting: General Practice

## 2018-03-13 VITALS — BP 108/65 | HR 97 | Ht 60.0 in | Wt 131.0 lb

## 2018-03-13 DIAGNOSIS — O09219 Supervision of pregnancy with history of pre-term labor, unspecified trimester: Principal | ICD-10-CM

## 2018-03-13 DIAGNOSIS — O09212 Supervision of pregnancy with history of pre-term labor, second trimester: Secondary | ICD-10-CM | POA: Diagnosis not present

## 2018-03-13 DIAGNOSIS — O09899 Supervision of other high risk pregnancies, unspecified trimester: Secondary | ICD-10-CM

## 2018-03-13 NOTE — Progress Notes (Signed)
Erin Price here for 17-P  Injection.  Injection administered without complication. Patient will return in one week for next injection.  Marylynn PearsonCarrie Kimbely Whiteaker, RN 03/13/2018  2:50 PM

## 2018-03-13 NOTE — Progress Notes (Signed)
I have reviewed the chart and agree with nursing staff's documentation of this patient's encounter.  Vonzella NippleJulie Lynnley Doddridge, PA-C 03/13/2018 3:41 PM

## 2018-03-20 ENCOUNTER — Ambulatory Visit (INDEPENDENT_AMBULATORY_CARE_PROVIDER_SITE_OTHER): Payer: Medicaid Other | Admitting: Obstetrics and Gynecology

## 2018-03-20 ENCOUNTER — Encounter: Payer: Self-pay | Admitting: Obstetrics and Gynecology

## 2018-03-20 VITALS — BP 102/67 | HR 95 | Wt 132.3 lb

## 2018-03-20 DIAGNOSIS — O09892 Supervision of other high risk pregnancies, second trimester: Secondary | ICD-10-CM

## 2018-03-20 DIAGNOSIS — O09899 Supervision of other high risk pregnancies, unspecified trimester: Secondary | ICD-10-CM

## 2018-03-20 DIAGNOSIS — R569 Unspecified convulsions: Secondary | ICD-10-CM

## 2018-03-20 DIAGNOSIS — O09212 Supervision of pregnancy with history of pre-term labor, second trimester: Secondary | ICD-10-CM

## 2018-03-20 DIAGNOSIS — O09219 Supervision of pregnancy with history of pre-term labor, unspecified trimester: Secondary | ICD-10-CM

## 2018-03-20 MED ORDER — PROGESTERONE MICRONIZED 200 MG PO CAPS: ORAL_CAPSULE | ORAL | 3 refills | Status: DC

## 2018-03-20 NOTE — Patient Instructions (Signed)
Third Trimester of Pregnancy The third trimester is from week 28 through week 40 (months 7 through 9). The third trimester is a time when the unborn baby (fetus) is growing rapidly. At the end of the ninth month, the fetus is about 20 inches in length and weighs 6-10 pounds. Body changes during your third trimester Your body will continue to go through many changes during pregnancy. The changes vary from woman to woman. During the third trimester:  Your weight will continue to increase. You can expect to gain 25-35 pounds (11-16 kg) by the end of the pregnancy.  You may begin to get stretch marks on your hips, abdomen, and breasts.  You may urinate more often because the fetus is moving lower into your pelvis and pressing on your bladder.  You may develop or continue to have heartburn. This is caused by increased hormones that slow down muscles in the digestive tract.  You may develop or continue to have constipation because increased hormones slow digestion and cause the muscles that push waste through your intestines to relax.  You may develop hemorrhoids. These are swollen veins (varicose veins) in the rectum that can itch or be painful.  You may develop swollen, bulging veins (varicose veins) in your legs.  You may have increased body aches in the pelvis, back, or thighs. This is due to weight gain and increased hormones that are relaxing your joints.  You may have changes in your hair. These can include thickening of your hair, rapid growth, and changes in texture. Some women also have hair loss during or after pregnancy, or hair that feels dry or thin. Your hair will most likely return to normal after your baby is born.  Your breasts will continue to grow and they will continue to become tender. A yellow fluid (colostrum) may leak from your breasts. This is the first milk you are producing for your baby.  Your belly button may stick out.  You may notice more swelling in your hands,  face, or ankles.  You may have increased tingling or numbness in your hands, arms, and legs. The skin on your belly may also feel numb.  You may feel short of breath because of your expanding uterus.  You may have more problems sleeping. This can be caused by the size of your belly, increased need to urinate, and an increase in your body's metabolism.  You may notice the fetus "dropping," or moving lower in your abdomen (lightening).  You may have increased vaginal discharge.  You may notice your joints feel loose and you may have pain around your pelvic bone.  What to expect at prenatal visits You will have prenatal exams every 2 weeks until week 36. Then you will have weekly prenatal exams. During a routine prenatal visit:  You will be weighed to make sure you and the baby are growing normally.  Your blood pressure will be taken.  Your abdomen will be measured to track your baby's growth.  The fetal heartbeat will be listened to.  Any test results from the previous visit will be discussed.  You may have a cervical check near your due date to see if your cervix has softened or thinned (effaced).  You will be tested for Group B streptococcus. This happens between 35 and 37 weeks.  Your health care provider may ask you:  What your birth plan is.  How you are feeling.  If you are feeling the baby move.  If you have had   any abnormal symptoms, such as leaking fluid, bleeding, severe headaches, or abdominal cramping.  If you are using any tobacco products, including cigarettes, chewing tobacco, and electronic cigarettes.  If you have any questions.  Other tests or screenings that may be performed during your third trimester include:  Blood tests that check for low iron levels (anemia).  Fetal testing to check the health, activity level, and growth of the fetus. Testing is done if you have certain medical conditions or if there are problems during the  pregnancy.  Nonstress test (NST). This test checks the health of your baby to make sure there are no signs of problems, such as the baby not getting enough oxygen. During this test, a belt is placed around your belly. The baby is made to move, and its heart rate is monitored during movement.  What is false labor? False labor is a condition in which you feel small, irregular tightenings of the muscles in the womb (contractions) that usually go away with rest, changing position, or drinking water. These are called Braxton Hicks contractions. Contractions may last for hours, days, or even weeks before true labor sets in. If contractions come at regular intervals, become more frequent, increase in intensity, or become painful, you should see your health care provider. What are the signs of labor?  Abdominal cramps.  Regular contractions that start at 10 minutes apart and become stronger and more frequent with time.  Contractions that start on the top of the uterus and spread down to the lower abdomen and back.  Increased pelvic pressure and dull back pain.  A watery or bloody mucus discharge that comes from the vagina.  Leaking of amniotic fluid. This is also known as your "water breaking." It could be a slow trickle or a gush. Let your health care provider know if it has a color or strange odor. If you have any of these signs, call your health care provider right away, even if it is before your due date. Follow these instructions at home: Medicines  Follow your health care provider's instructions regarding medicine use. Specific medicines may be either safe or unsafe to take during pregnancy.  Take a prenatal vitamin that contains at least 600 micrograms (mcg) of folic acid.  If you develop constipation, try taking a stool softener if your health care provider approves. Eating and drinking  Eat a balanced diet that includes fresh fruits and vegetables, whole grains, good sources of protein  such as meat, eggs, or tofu, and low-fat dairy. Your health care provider will help you determine the amount of weight gain that is right for you.  Avoid raw meat and uncooked cheese. These carry germs that can cause birth defects in the baby.  If you have low calcium intake from food, talk to your health care provider about whether you should take a daily calcium supplement.  Eat four or five small meals rather than three large meals a day.  Limit foods that are high in fat and processed sugars, such as fried and sweet foods.  To prevent constipation: ? Drink enough fluid to keep your urine clear or pale yellow. ? Eat foods that are high in fiber, such as fresh fruits and vegetables, whole grains, and beans. Activity  Exercise only as directed by your health care provider. Most women can continue their usual exercise routine during pregnancy. Try to exercise for 30 minutes at least 5 days a week. Stop exercising if you experience uterine contractions.  Avoid heavy   lifting.  Do not exercise in extreme heat or humidity, or at high altitudes.  Wear low-heel, comfortable shoes.  Practice good posture.  You may continue to have sex unless your health care provider tells you otherwise. Relieving pain and discomfort  Take frequent breaks and rest with your legs elevated if you have leg cramps or low back pain.  Take warm sitz baths to soothe any pain or discomfort caused by hemorrhoids. Use hemorrhoid cream if your health care provider approves.  Wear a good support bra to prevent discomfort from breast tenderness.  If you develop varicose veins: ? Wear support pantyhose or compression stockings as told by your healthcare provider. ? Elevate your feet for 15 minutes, 3-4 times a day. Prenatal care  Write down your questions. Take them to your prenatal visits.  Keep all your prenatal visits as told by your health care provider. This is important. Safety  Wear your seat belt at  all times when driving.  Make a list of emergency phone numbers, including numbers for family, friends, the hospital, and police and fire departments. General instructions  Avoid cat litter boxes and soil used by cats. These carry germs that can cause birth defects in the baby. If you have a cat, ask someone to clean the litter box for you.  Do not travel far distances unless it is absolutely necessary and only with the approval of your health care provider.  Do not use hot tubs, steam rooms, or saunas.  Do not drink alcohol.  Do not use any products that contain nicotine or tobacco, such as cigarettes and e-cigarettes. If you need help quitting, ask your health care provider.  Do not use any medicinal herbs or unprescribed drugs. These chemicals affect the formation and growth of the baby.  Do not douche or use tampons or scented sanitary pads.  Do not cross your legs for long periods of time.  To prepare for the arrival of your baby: ? Take prenatal classes to understand, practice, and ask questions about labor and delivery. ? Make a trial run to the hospital. ? Visit the hospital and tour the maternity area. ? Arrange for maternity or paternity leave through employers. ? Arrange for family and friends to take care of pets while you are in the hospital. ? Purchase a rear-facing car seat and make sure you know how to install it in your car. ? Pack your hospital bag. ? Prepare the baby's nursery. Make sure to remove all pillows and stuffed animals from the baby's crib to prevent suffocation.  Visit your dentist if you have not gone during your pregnancy. Use a soft toothbrush to brush your teeth and be gentle when you floss. Contact a health care provider if:  You are unsure if you are in labor or if your water has broken.  You become dizzy.  You have mild pelvic cramps, pelvic pressure, or nagging pain in your abdominal area.  You have lower back pain.  You have persistent  nausea, vomiting, or diarrhea.  You have an unusual or bad smelling vaginal discharge.  You have pain when you urinate. Get help right away if:  Your water breaks before 37 weeks.  You have regular contractions less than 5 minutes apart before 37 weeks.  You have a fever.  You are leaking fluid from your vagina.  You have spotting or bleeding from your vagina.  You have severe abdominal pain or cramping.  You have rapid weight loss or weight gain.    You have shortness of breath with chest pain.  You notice sudden or extreme swelling of your face, hands, ankles, feet, or legs.  Your baby makes fewer than 10 movements in 2 hours.  You have severe headaches that do not go away when you take medicine.  You have vision changes. Summary  The third trimester is from week 28 through week 40, months 7 through 9. The third trimester is a time when the unborn baby (fetus) is growing rapidly.  During the third trimester, your discomfort may increase as you and your baby continue to gain weight. You may have abdominal, leg, and back pain, sleeping problems, and an increased need to urinate.  During the third trimester your breasts will keep growing and they will continue to become tender. A yellow fluid (colostrum) may leak from your breasts. This is the first milk you are producing for your baby.  False labor is a condition in which you feel small, irregular tightenings of the muscles in the womb (contractions) that eventually go away. These are called Braxton Hicks contractions. Contractions may last for hours, days, or even weeks before true labor sets in.  Signs of labor can include: abdominal cramps; regular contractions that start at 10 minutes apart and become stronger and more frequent with time; watery or bloody mucus discharge that comes from the vagina; increased pelvic pressure and dull back pain; and leaking of amniotic fluid. This information is not intended to replace advice  given to you by your health care provider. Make sure you discuss any questions you have with your health care provider. Document Released: 09/03/2001 Document Revised: 02/15/2016 Document Reviewed: 11/10/2012 Elsevier Interactive Patient Education  2017 [employer]  

## 2018-03-20 NOTE — Progress Notes (Signed)
Pt. Did not have any more Makena so ordered & requested next day shipping. Will contact pt once arrive to come in & get injection.

## 2018-03-20 NOTE — Progress Notes (Signed)
Subjective:  Erin Price is a 27 y.o. G3P1102 at 2729w5d being seen today for ongoing prenatal care.  She is currently monitored for the following issues for this high-risk pregnancy and has Scoliosis; Migraine; Asthma, mild intermittent; Seizures (HCC); History of preterm delivery, currently pregnant; History of substance abuse; Supervision of other high risk pregnancy, antepartum; and Chlamydia infection affecting pregnancy on their problem list.  Patient denies cramps, pressure or VB.  Contractions: Irritability. Vag. Bleeding: None.  Movement: Present. Denies leaking of fluid.   The following portions of the patient's history were reviewed and updated as appropriate: allergies, current medications, past family history, past medical history, past social history, past surgical history and problem list. Problem list updated.  Objective:   Vitals:   03/20/18 0929  BP: 102/67  Pulse: 95  Weight: 132 lb 4.8 oz (60 kg)    Fetal Status: Fetal Heart Rate (bpm): 154   Movement: Present     General:  Alert, oriented and cooperative. Patient is in no acute distress.  Skin: Skin is warm and dry. No rash noted.   Cardiovascular: Normal heart rate noted  Respiratory: Normal respiratory effort, no problems with respiration noted  Abdomen: Soft, gravid, appropriate for gestational age. Pain/Pressure: Present     Pelvic:  Cervical exam deferred        Extremities: Normal range of motion.  Edema: None  Mental Status: Normal mood and affect. Normal behavior. Normal judgment and thought content.   Urinalysis:      Assessment and Plan:  Pregnancy: G3P1102 at 2729w5d  1. Supervision of other high risk pregnancy, antepartum Stable Glucola next visit  2. 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 History of preterm delivery, currently pregnant CL 1.8 on 6/14. Started on vaginal progesterone plus continuation of 17 OHP. Pt however was confused about progesterone and  has not started Vaginal progesterone reviewed with pt  Rx sent Out of 17 OHP today. It has been ordered Continue with weekly 17 OHP and pelvic rest  Preterm labor symptoms and general obstetric precautions including but not limited to vaginal bleeding, contractions, leaking of fluid and fetal movement were reviewed in detail with the patient. Please refer to After Visit Summary for other counseling recommendations.  Return in about 2 weeks (around 04/03/2018) for OB visit.   Hermina StaggersErvin, Ryaan Vanwagoner L, MD

## 2018-03-23 ENCOUNTER — Ambulatory Visit (INDEPENDENT_AMBULATORY_CARE_PROVIDER_SITE_OTHER): Payer: Medicaid Other

## 2018-03-23 DIAGNOSIS — O09899 Supervision of other high risk pregnancies, unspecified trimester: Secondary | ICD-10-CM

## 2018-03-23 DIAGNOSIS — O09219 Supervision of pregnancy with history of pre-term labor, unspecified trimester: Secondary | ICD-10-CM

## 2018-03-23 DIAGNOSIS — O09212 Supervision of pregnancy with history of pre-term labor, second trimester: Secondary | ICD-10-CM | POA: Diagnosis present

## 2018-03-23 NOTE — Progress Notes (Signed)
Patient here today for 17P Makena injection.  Injection given subq in right arm.  Patient tolerated well and will return next week.  Additional injections ordered today as well.

## 2018-03-30 ENCOUNTER — Ambulatory Visit (INDEPENDENT_AMBULATORY_CARE_PROVIDER_SITE_OTHER): Payer: Medicaid Other | Admitting: General Practice

## 2018-03-30 VITALS — BP 105/57 | HR 89 | Ht 60.0 in | Wt 133.0 lb

## 2018-03-30 DIAGNOSIS — O09212 Supervision of pregnancy with history of pre-term labor, second trimester: Secondary | ICD-10-CM | POA: Diagnosis present

## 2018-03-30 DIAGNOSIS — O09219 Supervision of pregnancy with history of pre-term labor, unspecified trimester: Principal | ICD-10-CM

## 2018-03-30 DIAGNOSIS — O09899 Supervision of other high risk pregnancies, unspecified trimester: Secondary | ICD-10-CM

## 2018-03-30 NOTE — Progress Notes (Signed)
I have reviewed the chart and agree with nursing staff's documentation of this patient's encounter.  Tamir Wallman, CNM 03/30/2018 12:11 PM    

## 2018-03-30 NOTE — Progress Notes (Signed)
Erin Price here for 17-P  Injection.  Injection administered without complication. Patient will return in one week for next injection.  Marylynn PearsonCarrie Hillman, RN 03/30/2018  9:16 AM

## 2018-04-03 ENCOUNTER — Other Ambulatory Visit: Payer: Self-pay | Admitting: General Practice

## 2018-04-03 DIAGNOSIS — O09899 Supervision of other high risk pregnancies, unspecified trimester: Secondary | ICD-10-CM

## 2018-04-06 ENCOUNTER — Ambulatory Visit (INDEPENDENT_AMBULATORY_CARE_PROVIDER_SITE_OTHER): Payer: Medicaid Other | Admitting: Obstetrics & Gynecology

## 2018-04-06 ENCOUNTER — Other Ambulatory Visit (HOSPITAL_COMMUNITY)
Admission: RE | Admit: 2018-04-06 | Discharge: 2018-04-06 | Disposition: A | Discharge status: Home / Self Care | Payer: Medicaid Other | Source: Ambulatory Visit | Attending: Obstetrics & Gynecology | Admitting: Obstetrics & Gynecology

## 2018-04-06 ENCOUNTER — Other Ambulatory Visit: Payer: Medicaid Other

## 2018-04-06 ENCOUNTER — Encounter: Payer: Self-pay | Admitting: Obstetrics & Gynecology

## 2018-04-06 VITALS — BP 116/66 | HR 101 | Wt 136.0 lb

## 2018-04-06 DIAGNOSIS — O3433 Maternal care for cervical incompetence, third trimester: Secondary | ICD-10-CM | POA: Diagnosis present

## 2018-04-06 DIAGNOSIS — Z23 Encounter for immunization: Secondary | ICD-10-CM | POA: Diagnosis not present

## 2018-04-06 DIAGNOSIS — O09899 Supervision of other high risk pregnancies, unspecified trimester: Secondary | ICD-10-CM

## 2018-04-06 DIAGNOSIS — Z3A29 29 weeks gestation of pregnancy: Secondary | ICD-10-CM | POA: Diagnosis not present

## 2018-04-06 DIAGNOSIS — O09219 Supervision of pregnancy with history of pre-term labor, unspecified trimester: Secondary | ICD-10-CM

## 2018-04-06 DIAGNOSIS — O26873 Cervical shortening, third trimester: Secondary | ICD-10-CM | POA: Insufficient documentation

## 2018-04-06 DIAGNOSIS — O09213 Supervision of pregnancy with history of pre-term labor, third trimester: Secondary | ICD-10-CM

## 2018-04-06 DIAGNOSIS — O09893 Supervision of other high risk pregnancies, third trimester: Secondary | ICD-10-CM

## 2018-04-06 LAB — POCT URINALYSIS DIP (DEVICE)
Bilirubin Urine: NEGATIVE
Glucose, UA: NEGATIVE mg/dL
Ketones, ur: NEGATIVE mg/dL
Nitrite: NEGATIVE
Protein, ur: NEGATIVE mg/dL
Specific Gravity, Urine: 1.025 (ref 1.005–1.030)
Urobilinogen, UA: 0.2 mg/dL (ref 0.0–1.0)
pH: 6 (ref 5.0–8.0)

## 2018-04-06 MED ORDER — BETAMETHASONE SOD PHOS & ACET 6 (3-3) MG/ML IJ SUSP
12.0000 mg | INTRAMUSCULAR | Status: AC
  Administered 2018-04-06: 12 mg via INTRAMUSCULAR

## 2018-04-06 NOTE — Progress Notes (Signed)
PRENATAL VISIT NOTE  Subjective:  Erin Price is a 27 y.o. G3P1102 at 8068w1d being seen today for ongoing prenatal care.  She is currently monitored for the following issues for this high-risk pregnancy and has Scoliosis; Migraine; Asthma, mild intermittent; Seizures (HCC); History of preterm delivery, currently pregnant; History of substance abuse; Supervision of other high risk pregnancy, antepartum; Chlamydia infection affecting pregnancy; and Short cervix during pregnancy in third trimester on their problem list.  Patient reports occasional contractions last night, none currently. Was diagnosed with short cervix at on 03/06/18 at 3833w5d; had 1.8 cm cervical length with funneling. She was told to follow up with clinical exams as needed; also started on vaginal Prometrium. Was also given betamethasone 03/06/18 and 03/07/18.  She desires cervical check today; reports that her cervix started to open during her last pregnancy around 32 weeks (had a 3550w4d SVD).  Contractions: Irritability. Vag. Bleeding: None.  Movement: Present. Denies leaking of fluid.   The following portions of the patient's history were reviewed and updated as appropriate: allergies, current medications, past family history, past medical history, past social history, past surgical history and problem list. Problem list updated.  Objective:   Vitals:   04/06/18 0939  BP: 116/66  Pulse: (!) 101  Weight: 136 lb (61.7 kg)    Fetal Status: Fetal Heart Rate (bpm): 138 Fundal Height: 29 cm Movement: Present  Presentation: Vertex (confirmed on bedside scan)  General:  Alert, oriented and cooperative. Patient is in no acute distress.  Skin: Skin is warm and dry. No rash noted.   Cardiovascular: Normal heart rate noted  Respiratory: Normal respiratory effort, no problems with respiration noted  Abdomen: Soft, gravid, appropriate for gestational age.  Pain/Pressure: Present     Pelvic: Cervical exam performed Dilation: 3  Effacement (%): 50 Station: -3  Extremities: Normal range of motion.  Edema: Trace  Mental Status: Normal mood and affect. Normal behavior. Normal judgment and thought content.   03/06/2018 US MFM OB Followup and Transvaginal Ultrasound Impression  SIUP at 24+5 weeks  Normal interval anatomy; anatomic survey complete  Normal amniotic fluid volume  Appropriate interval growth with EFW at the 76th %tile  EV views of cervix: funneling with distal closed portion measuring 1.8 cms ---------------------------------------------------------------------- Recommendations  Continue weekly 17P  Begin nightly vaginal progesterone  Follow CLs clinically  Received #1 BMZ today; #2 BMZ in MAU tomorrow  Results for orders placed or performed in visit on 04/06/18 (from the past 48 hour(s))  POCT urinalysis dip (device)     Status: Abnormal   Collection Time: 04/06/18 11:09 AM  Result Value Ref Range   Glucose, UA NEGATIVE NEGATIVE mg/dL   Bilirubin Urine NEGATIVE NEGATIVE   Ketones, ur NEGATIVE NEGATIVE mg/dL   Specific Gravity, Urine 1.025 1.005 - 1.030   Hgb urine dipstick TRACE (A) NEGATIVE   pH 6.0 5.0 - 8.0   Protein, ur NEGATIVE NEGATIVE mg/dL   Urobilinogen, UA 0.2 0.0 - 1.0 mg/dL   Nitrite NEGATIVE NEGATIVE   Leukocytes, UA TRACE (A) NEGATIVE    Comment: Biochemical Testing Only. Please order routine urinalysis from main lab if confirmatory testing is needed.     Assessment and Plan:  Pregnancy: G3P1102 at 3668w1d  1. Premature cervical dilation, third trimester 2. History of preterm delivery, currently pregnant Given cervical change, pelvic cultures and UA done. UA neg, will follow up cultures. She was put on the fetal monitor and tocometry; she had a reassuring NSTand there were no contractions noted.  Betamethasone regimen re-initiated today, will follow up tomorrow. PTL precautions strictly advised, may need cervical re-check tomorrow after second betamethasone injection. No current  need for inpatient observation. Of note, patient did receive her 17P injection today, will continue weekly as planned. - GC/Chlamydia probe amp ([place])not at [facility] - Culture, beta strep (group b only) - Fetal nonstress test - betamethasone acetate-betamethasone sodium phosphate (CELESTONE) injection 12 mg daily x 2 doses  3. Need for Tdap vaccination - Tdap vaccine greater than or equal to 7yo IM given today  4. Supervision of other high risk pregnancy, antepartum Third trimester labs done today, will follow up results and manage accordingly. - CBC - RPR - HIV antibody - Glucose Tolerance, 2 Hours w/1 Hour  Preterm labor symptoms and general obstetric precautions including but not limited to vaginal bleeding, contractions, leaking of fluid and fetal movement were reviewed in detail with the patient. Please refer to After Visit Summary for other counseling recommendations.  Return in about 1 day (around 04/07/2018) for  RN visit, BMZ#2, ?cervix check  1week: 17P only  2 weeks: 17P and OB Visit (HOB) 3 weeks: 17P only.  Future Appointments  Date Time Provider Department Center  04/07/2018 10:20 AM [facility] NURSE [facility]  04/13/2018  9:15 AM [facility] NURSE [facility]  04/20/2018 10:15 AM [facility] NURSE [facility]  04/27/2018  9:15 AM Reva Bores, MD [facility]  05/04/2018  9:15 AM [facility] NURSE [facility]  05/11/2018  9:15 AM Hermina Staggers, MD [facility]    Jaynie Collins, MD

## 2018-04-06 NOTE — Patient Instructions (Addendum)
Return to clinic for any scheduled appointments or obstetric concerns, or go to MAU for evaluation  [facility] [place] [address], [address] [place], [place]  [address] Phone - [phone]   Fax - [phone]  [facility] [address] [place], [place] [address] Phone - [phone]   Fax - [place] [address] [place], [place]  [address] Phone - [phone]   Fax - [phone]  [place]. [address], [place] [address] [place], [place]  [address] Phone - [phone]   Fax - [phone]  [place] [address] [place], [place]  [address] Phone - [phone]   Fax - [phone]  [facility] [address], [address] [place], [place]  [address] Phone - [phone]   Fax - [place] [address] [place], [place]  [address] Phone - [phone]   Fax - [phone]  [place] [address] [place], [place] [address] [place], [place]  [address] Phone - [phone]   Fax - [place] [address] [place], [place]  [address] Phone - [phone]   Fax - [phone] [facility] E[place]. [address] [place], [place]  [address] Phone - [phone]   Fax - [phone]  [place]AG[place] Naylor [address] [place], [place]  [address] Phone - [phone]   Fax - [phone]  [facility] AT [place], [place], [place]  [address] Phone - [phone]   Fax - [place] [address], [place], [place]  [address] Phone - [phone]   Fax - [phone]  [facility] [place] [address], [place], [place]  [address] Phone - [place], [place]  [address] Phone - [phone]   Fax - [place] [address] [place], [place]  [address] Phone - [phone]   Fax - [phone]  [place] [address] [place], [place]  [address] Phone - [phone]   Fax - [place]. [place], [place]  [address] Phone - [phone]   Fax - [place], [place], [place]  [address] Phone - [phone]   Fax - [place] [address] [place], [place]  [address] Phone - [phone]   Fax - [phone]  DAVID RUBIN [address], [place], [place]  [address] Phone - [phone]   Fax - [place] [place], [place]  [address] Phone - [phone]   Fax - [phone]  [place] [address] [place], [place]  [address] Phone - [phone]   Fax - [phone] Arnaldo Natal [address] [place], [place]  [address] Phone - [phone]   Fax - [place] [address] [place],   [address] Phone - [phone]   Fax - [place] [address], [place],   [address] Phone - [phone]   Fax - [phone]  [place]re[place] MD [address] [place] [address] Phone [phone]  Fax  [phone]   Research childbirth classes and hospital preregistration at [email or web address]  Fetal Movement Counts Patient Name: ________________________________________________ Patient Due Date: ____________________ What is a fetal movement count? A fetal movement count is the number of times that you feel your baby move during a certain amount of time. This may also be called a  fetal kick count. A fetal movement count is recommended for every pregnant woman. You may be asked to start counting fetal movements as early as week 28 of your pregnancy. Pay attention to when your baby is most active. You may notice your baby's sleep and wake cycles. You may also notice things that make your baby move more. You should do a fetal movement count:  When your baby is normally most active.  At the same time each day.  A good time to count movements is while you are resting, after having something to eat and drink. How do I count fetal movements? 1. Find a quiet, comfortable area. Sit, or lie down on your side. 2. Write down the date, the start time and stop time, and the number of movements that you felt between those two times. Take this information with you to your health care visits. 3. For 2 hours, count kicks, flutters, swishes, rolls, and jabs. You should feel at least 10 movements during 2 hours. 4. You may stop counting after you have felt 10 movements. 5. If you do not feel 10 movements in 2 hours, have something to eat and drink. Then, keep resting and counting for 1 hour. If you feel at least 4 movements during that hour, you may stop counting. Contact a health care provider if:  You feel fewer than 4 movements in 2 hours.  Your baby is not moving like he or she usually does. Date: ____________ Start time: ____________ Stop time: ____________ Movements: ____________ Date: ____________ Start time: ____________ Stop time: ____________ Movements: ____________ Date: ____________ Start time: ____________ Stop time: ____________ Movements: ____________ Date: ____________ Start time: ____________ Stop time: ____________ Movements: ____________ Date: ____________ Start time: ____________ Stop time: ____________ Movements: ____________ Date: ____________ Start time: ____________ Stop time: ____________ Movements: ____________ Date: ____________ Start time: ____________ Stop  time: ____________ Movements: ____________ Date: ____________ Start time: ____________ Stop time: ____________ Movements: ____________ Date: ____________ Start time: ____________ Stop time: ____________ Movements: ____________ This information is not intended to replace advice given to you by your health care provider. Make sure you discuss any questions you have with your health care provider. Document Released: 10/09/2006 Document Revised: 05/08/2016 Document Reviewed: 10/19/2015 Elsevier Interactive Patient Education  2018 [employer]  [employer] CorporationBraxton Hicks Contractions Contractions of the uterus can occur throughout pregnancy, but they are not always a sign that you are in labor. You may have practice contractions called Braxton Hicks contractions. These false labor contractions are sometimes confused with true labor. What are Deberah PeltonBraxton Hicks contractions? Braxton Hicks contractions are tightening movements that occur in the muscles of the uterus before labor. Unlike true labor contractions, these contractions do not result in opening (dilation) and thinning of the cervix. Toward the end of pregnancy (32-34 weeks), Braxton Hicks contractions can happen more often and may become stronger. These contractions are sometimes difficult to tell apart from true labor because they can be very uncomfortable. You should not feel embarrassed if you go to the hospital with false labor. Sometimes, the only way to tell if you are in true labor is for your health care provider to look for changes in the cervix. The  health care provider will do a physical exam and may monitor your contractions. If you are not in true labor, the exam should show that your cervix is not dilating and your water has not broken. If there are other health problems associated with your pregnancy, it is completely safe for you to be sent home with false labor. You may continue to have Braxton Hicks contractions until you go into true labor. How to  tell the difference between true labor and false labor True labor  Contractions last 30-70 seconds.  Contractions become very regular.  Discomfort is usually felt in the top of the uterus, and it spreads to the lower abdomen and low back.  Contractions do not go away with walking.  Contractions usually become more intense and increase in frequency.  The cervix dilates and gets thinner. False labor  Contractions are usually shorter and not as strong as true labor contractions.  Contractions are usually irregular.  Contractions are often felt in the front of the lower abdomen and in the groin.  Contractions may go away when you walk around or change positions while lying down.  Contractions get weaker and are shorter-lasting as time goes on.  The cervix usually does not dilate or become thin. Follow these instructions at home:  Take over-the-counter and prescription medicines only as told by your health care provider.  Keep up with your usual exercises and follow other instructions from your health care provider.  Eat and drink lightly if you think you are going into labor.  If Braxton Hicks contractions are making you uncomfortable: ? Change your position from lying down or resting to walking, or change from walking to resting. ? Sit and rest in a tub of warm water. ? Drink enough fluid to keep your urine pale yellow. Dehydration may cause these contractions. ? Do slow and deep breathing several times an hour.  Keep all follow-up prenatal visits as told by your health care provider. This is important. Contact a health care provider if:  You have a fever.  You have continuous pain in your abdomen. Get help right away if:  Your contractions become stronger, more regular, and closer together.  You have fluid leaking or gushing from your vagina.  You pass blood-tinged mucus (bloody show).  You have bleeding from your vagina.  You have low back pain that you never  had before.  You feel your baby's head pushing down and causing pelvic pressure.  Your baby is not moving inside you as much as it used to. Summary  Contractions that occur before labor are called Braxton Hicks contractions, false labor, or practice contractions.  Braxton Hicks contractions are usually shorter, weaker, farther apart, and less regular than true labor contractions. True labor contractions usually become progressively stronger and regular and they become more frequent.  Manage discomfort from [facility] contractions by changing position, resting in a warm bath, drinking plenty of water, or practicing deep breathing. This information is not intended to replace advice given to you by your health care provider. Make sure you discuss any questions you have with your health care provider. Document Released: 01/23/2017 Document Revised: 01/23/2017 Document Reviewed: 01/23/2017 Elsevier Interactive Patient Education  2018 [employer].

## 2018-04-07 ENCOUNTER — Ambulatory Visit (INDEPENDENT_AMBULATORY_CARE_PROVIDER_SITE_OTHER): Payer: Medicaid Other | Admitting: Obstetrics & Gynecology

## 2018-04-07 VITALS — Wt 136.8 lb

## 2018-04-07 DIAGNOSIS — O09899 Supervision of other high risk pregnancies, unspecified trimester: Secondary | ICD-10-CM

## 2018-04-07 DIAGNOSIS — O09213 Supervision of pregnancy with history of pre-term labor, third trimester: Secondary | ICD-10-CM

## 2018-04-07 DIAGNOSIS — O09219 Supervision of pregnancy with history of pre-term labor, unspecified trimester: Secondary | ICD-10-CM

## 2018-04-07 LAB — CBC
Hematocrit: 30.7 % — ABNORMAL LOW (ref 34.0–46.6)
Hemoglobin: 10.7 g/dL — ABNORMAL LOW (ref 11.1–15.9)
MCH: 30.7 pg (ref 26.6–33.0)
MCHC: 34.9 g/dL (ref 31.5–35.7)
MCV: 88 fL (ref 79–97)
Platelets: 187 10*3/uL (ref 150–450)
RBC: 3.49 x10E6/uL — ABNORMAL LOW (ref 3.77–5.28)
RDW: 13.4 % (ref 12.3–15.4)
WBC: 9.7 10*3/uL (ref 3.4–10.8)

## 2018-04-07 LAB — GLUCOSE TOLERANCE, 2 HOURS W/ 1HR
Glucose, 1 hour: 94 mg/dL (ref 65–179)
Glucose, 2 hour: 104 mg/dL (ref 65–152)
Glucose, Fasting: 71 mg/dL (ref 65–91)

## 2018-04-07 LAB — RPR: RPR Ser Ql: NONREACTIVE

## 2018-04-07 LAB — GC/CHLAMYDIA PROBE AMP (~~LOC~~) NOT AT ARMC
Chlamydia: NEGATIVE
Neisseria Gonorrhea: NEGATIVE

## 2018-04-07 LAB — HIV ANTIBODY (ROUTINE TESTING W REFLEX): HIV Screen 4th Generation wRfx: NONREACTIVE

## 2018-04-07 MED ORDER — BETAMETHASONE SOD PHOS & ACET 6 (3-3) MG/ML IJ SUSP
12.0000 mg | Freq: Once | INTRAMUSCULAR | Status: AC
  Administered 2018-04-07: 12 mg via INTRAMUSCULAR

## 2018-04-07 NOTE — Progress Notes (Signed)
Pt states fell on steps this am and landed on butt, just sore. Was having contractions last night.

## 2018-04-08 NOTE — Progress Notes (Signed)
I have reviewed the chart and agree with nursing staff's documentation of this patient's encounter.  Cire Clute, MD 04/08/2018 11:19 AM    

## 2018-04-10 LAB — CULTURE, BETA STREP (GROUP B ONLY): Strep Gp B Culture: NEGATIVE

## 2018-04-13 ENCOUNTER — Ambulatory Visit (INDEPENDENT_AMBULATORY_CARE_PROVIDER_SITE_OTHER): Payer: Medicaid Other | Admitting: General Practice

## 2018-04-13 VITALS — BP 114/66 | HR 100 | Ht 60.0 in | Wt 135.0 lb

## 2018-04-13 DIAGNOSIS — O09219 Supervision of pregnancy with history of pre-term labor, unspecified trimester: Principal | ICD-10-CM

## 2018-04-13 DIAGNOSIS — O09213 Supervision of pregnancy with history of pre-term labor, third trimester: Secondary | ICD-10-CM | POA: Diagnosis present

## 2018-04-13 DIAGNOSIS — O09899 Supervision of other high risk pregnancies, unspecified trimester: Secondary | ICD-10-CM

## 2018-04-13 NOTE — Progress Notes (Addendum)
Erin Price here for 17-P  Injection.  Injection administered without complication. Patient will return in one week for next injection. Patient reports contractions all night long, will see Dr Erin FullingHarraway Smith for cervical check & return for OB visit in 1 week. FHR 143 bpm today.  Marylynn Pearsonarrie Klyn Kroening, RN 04/13/2018  9:22 AM   Pt reports ctx overnight. Not increased form prev but, she is worried about PTD. She is here for her 17-OH-P.  Her cervix was checked. She is 2-3/50%/ ball  Reviewed PTL precautions.  F/u in 1 week or sooner prn  Carolyn L. Harraway-Smith, M.D., Ever[place] CoreFACOG

## 2018-04-20 ENCOUNTER — Ambulatory Visit: Payer: Medicaid Other

## 2018-04-20 ENCOUNTER — Ambulatory Visit (INDEPENDENT_AMBULATORY_CARE_PROVIDER_SITE_OTHER): Payer: Medicaid Other | Admitting: Obstetrics & Gynecology

## 2018-04-20 VITALS — BP 109/67 | HR 92 | Wt 137.4 lb

## 2018-04-20 DIAGNOSIS — O09213 Supervision of pregnancy with history of pre-term labor, third trimester: Secondary | ICD-10-CM | POA: Diagnosis present

## 2018-04-20 DIAGNOSIS — O09899 Supervision of other high risk pregnancies, unspecified trimester: Secondary | ICD-10-CM

## 2018-04-20 DIAGNOSIS — O09893 Supervision of other high risk pregnancies, third trimester: Secondary | ICD-10-CM

## 2018-04-20 NOTE — Progress Notes (Signed)
   PRENATAL VISIT NOTE  Subjective:  Erin Price is a 27 y.o. G3P1102 at 4641w1d being seen today for ongoing prenatal care.  She is currently monitored for the following issues for this high-risk pregnancy and has Scoliosis; Migraine; Asthma, mild intermittent; Seizures (HCC); History of preterm delivery, currently pregnant; History of substance abuse; Supervision of other high risk pregnancy, antepartum; Chlamydia infection affecting pregnancy; and Short cervix during pregnancy in third trimester on their problem list.  Patient reports contractions last night that have since stopped.  5 in an hour. .  Contractions: Irritability. Vag. Bleeding: None.  Movement: Present. Denies leaking of fluid.   The following portions of the patient's history were reviewed and updated as appropriate: allergies, current medications, past family history, past medical history, past social history, past surgical history and problem list. Problem list updated.  Objective:   Vitals:   04/20/18 0822  BP: 109/67  Pulse: 92  Weight: 137 lb 6.4 oz (62.3 kg)    Fetal Status: Fetal Heart Rate (bpm): 139 Fundal Height: 28 cm Movement: Present  Presentation: Vertex  General:  Alert, oriented and cooperative. Patient is in no acute distress.  Skin: Skin is warm and dry. No rash noted.   Cardiovascular: Normal heart rate noted  Respiratory: Normal respiratory effort, no problems with respiration noted  Abdomen: Soft, gravid, appropriate for gestational age.  Pain/Pressure: Present     Pelvic: Cervical exam performed Dilation: 3 Effacement (%): 50 Station: -2  Extremities: Normal range of motion.  Edema: Trace  Mental Status: Normal mood and affect. Normal behavior. Normal judgment and thought content.   Assessment and Plan:  Pregnancy: G3P1102 at 1541w1d  1. Supervision of other high risk pregnancy, antepartum -Cervix stable--3 cm/50.  Per the patient, she was checked by Dr. Lowry RamHaraway smith on 7/22 and told she  was 3 cm (do not see exam on chart).  Cervix 3 cm now.   -Size less than dates.  Will get US for growth -Pt to come to MAU or work in clinic visit if 5 ctx in one hour occur.  Stay hydrated.   - US MFM OB FOLLOW UP; Future  Preterm labor symptoms and general obstetric precautions including but not limited to vaginal bleeding, contractions, leaking of fluid and fetal movement were reviewed in detail with the patient. Please refer to After Visit Summary for other counseling recommendations.  Return in about 2 weeks (around 05/04/2018).  Future Appointments  Date Time Provider Department Center  04/27/2018  9:15 AM Reva BoresPratt, Tanya S, MD [facility]  05/04/2018  9:15 AM Lelon PerlaWOC-WOCA NURSE WOC-WOCA WOC  05/11/2018  9:15 AM Hermina StaggersErvin, Michael L, MD WOC-WOCA WOC  05/12/2018  8:30 AM WH-MFC US 5 WH-MFCUS MFC-US    Elsie LincolnKelly Diamond Martucci, MD

## 2018-04-20 NOTE — Progress Notes (Signed)
OB US scheduled for August 20th @ 0830.  Pt notified.

## 2018-04-27 ENCOUNTER — Encounter: Payer: Self-pay | Admitting: Family Medicine

## 2018-04-27 ENCOUNTER — Ambulatory Visit (INDEPENDENT_AMBULATORY_CARE_PROVIDER_SITE_OTHER): Payer: Medicaid Other | Admitting: Family Medicine

## 2018-04-27 VITALS — BP 105/64 | HR 87 | Wt 138.0 lb

## 2018-04-27 DIAGNOSIS — O09899 Supervision of other high risk pregnancies, unspecified trimester: Secondary | ICD-10-CM

## 2018-04-27 DIAGNOSIS — O09219 Supervision of pregnancy with history of pre-term labor, unspecified trimester: Secondary | ICD-10-CM

## 2018-04-27 DIAGNOSIS — O26873 Cervical shortening, third trimester: Secondary | ICD-10-CM

## 2018-04-27 NOTE — Patient Instructions (Signed)

## 2018-04-27 NOTE — Progress Notes (Signed)
   PRENATAL VISIT NOTE  Subjective:  Erin Price is a 27 y.o. G3P1102 at 3542w1d being seen today for ongoing prenatal care.  She is currently monitored for the following issues for this high-risk pregnancy and has Scoliosis; Migraine; Asthma, mild intermittent; Seizures (HCC); History of preterm delivery, currently pregnant; History of substance abuse; Supervision of other high risk pregnancy, antepartum; Chlamydia infection affecting pregnancy; and Short cervix during pregnancy in third trimester on their problem list.  Patient reports no complaints.  Contractions: Irritability. Vag. Bleeding: None.  Movement: Present. Denies leaking of fluid.   The following portions of the patient's history were reviewed and updated as appropriate: allergies, current medications, past family history, past medical history, past social history, past surgical history and problem list. Problem list updated.  Objective:   Vitals:   04/27/18 0951  BP: 105/64  Pulse: 87  Weight: 138 lb (62.6 kg)    Fetal Status: Fetal Heart Rate (bpm): 136 Fundal Height: 28 cm Movement: Present  Presentation: Transverse  General:  Alert, oriented and cooperative. Patient is in no acute distress.  Skin: Skin is warm and dry. No rash noted.   Cardiovascular: Normal heart rate noted  Respiratory: Normal respiratory effort, no problems with respiration noted  Abdomen: Soft, gravid, appropriate for gestational age.  Pain/Pressure: Present     Pelvic: Cervical exam deferred        Extremities: Normal range of motion.  Edema: Trace  Mental Status: Normal mood and affect. Normal behavior. Normal judgment and thought content.   Assessment and Plan:  Pregnancy: G3P1102 at 5842w1d  1. History of preterm delivery, currently pregnant On 17 P weekly--should be here tomorrow.  2. Short cervix during pregnancy in third trimester On Prometrium, continue this  3. Supervision of other high risk pregnancy, antepartum Continue  prenatal care.   Preterm labor symptoms and general obstetric precautions including but not limited to vaginal bleeding, contractions, leaking of fluid and fetal movement were reviewed in detail with the patient. Please refer to After Visit Summary for other counseling recommendations.  Return in 2 weeks (on 05/11/2018) for 17 P weekly.  Future Appointments  Date Time Provider Department Center  04/28/2018  9:00 AM WOC-WOCA NURSE WOC-WOCA WOC  05/04/2018  9:15 AM WOC-WOCA NURSE WOC-WOCA WOC  05/11/2018  9:15 AM Hermina StaggersErvin, Michael L, MD WOC-WOCA WOC  05/12/2018  8:30 AM WH-MFC US 5 WH-MFCUS MFC-US    Reva Boresanya S Othniel Maret, MD

## 2018-04-28 ENCOUNTER — Ambulatory Visit: Payer: Medicaid Other

## 2018-04-29 ENCOUNTER — Ambulatory Visit (INDEPENDENT_AMBULATORY_CARE_PROVIDER_SITE_OTHER): Payer: Medicaid Other

## 2018-04-29 VITALS — Wt 140.0 lb

## 2018-04-29 DIAGNOSIS — O09219 Supervision of pregnancy with history of pre-term labor, unspecified trimester: Principal | ICD-10-CM

## 2018-04-29 DIAGNOSIS — O09899 Supervision of other high risk pregnancies, unspecified trimester: Secondary | ICD-10-CM

## 2018-04-29 DIAGNOSIS — O09213 Supervision of pregnancy with history of pre-term labor, third trimester: Secondary | ICD-10-CM | POA: Diagnosis not present

## 2018-04-29 MED ORDER — HYDROXYPROGESTERONE CAPROATE 275 MG/1.1ML ~~LOC~~ SOAJ
275.0000 mg | Freq: Once | SUBCUTANEOUS | Status: AC
  Administered 2018-04-29: 275 mg via SUBCUTANEOUS

## 2018-04-29 NOTE — Progress Notes (Addendum)
Erin Price here for 17-P  Injection.  Injection administered without complication. Patient will return in one week for next injection.  Erin Price, CMA 04/29/2018  6:35 PM   Reviewed note and I agree with the plan of care.  [name], RN, MSN, NP-BC Nurse Practitioner, [facility], [facility] 04/29/2018 8:02 PM

## 2018-05-04 ENCOUNTER — Ambulatory Visit (INDEPENDENT_AMBULATORY_CARE_PROVIDER_SITE_OTHER): Payer: Medicaid Other | Admitting: *Deleted

## 2018-05-04 VITALS — BP 103/56 | HR 80

## 2018-05-04 DIAGNOSIS — O09899 Supervision of other high risk pregnancies, unspecified trimester: Secondary | ICD-10-CM

## 2018-05-04 DIAGNOSIS — O09213 Supervision of pregnancy with history of pre-term labor, third trimester: Secondary | ICD-10-CM

## 2018-05-04 DIAGNOSIS — O09219 Supervision of pregnancy with history of pre-term labor, unspecified trimester: Principal | ICD-10-CM

## 2018-05-04 NOTE — Progress Notes (Signed)
Patient seen and assessed by nursing staff.  Agree with documentation and plan.  

## 2018-05-11 ENCOUNTER — Ambulatory Visit (INDEPENDENT_AMBULATORY_CARE_PROVIDER_SITE_OTHER): Payer: Medicaid Other | Admitting: Obstetrics and Gynecology

## 2018-05-11 ENCOUNTER — Encounter: Payer: Self-pay | Admitting: Obstetrics and Gynecology

## 2018-05-11 VITALS — BP 102/66 | HR 90 | Wt 141.5 lb

## 2018-05-11 DIAGNOSIS — O26873 Cervical shortening, third trimester: Secondary | ICD-10-CM

## 2018-05-11 DIAGNOSIS — O09219 Supervision of pregnancy with history of pre-term labor, unspecified trimester: Principal | ICD-10-CM

## 2018-05-11 DIAGNOSIS — O09893 Supervision of other high risk pregnancies, third trimester: Secondary | ICD-10-CM

## 2018-05-11 DIAGNOSIS — O09213 Supervision of pregnancy with history of pre-term labor, third trimester: Secondary | ICD-10-CM

## 2018-05-11 DIAGNOSIS — O09899 Supervision of other high risk pregnancies, unspecified trimester: Secondary | ICD-10-CM

## 2018-05-11 MED ORDER — HYDROXYPROGESTERONE CAPROATE 275 MG/1.1ML ~~LOC~~ SOAJ
275.0000 mg | Freq: Once | SUBCUTANEOUS | Status: DC
Start: 2018-05-11 — End: 2018-06-07

## 2018-05-11 NOTE — Progress Notes (Signed)
Pt states having a lot of back pain & cramps.

## 2018-05-11 NOTE — Patient Instructions (Signed)

## 2018-05-11 NOTE — Progress Notes (Signed)
Subjective:  Erin Price is a 27 y.o. G3P1102 at 6551w1d being seen today for ongoing prenatal care.  She is currently monitored for the following issues for this high-risk pregnancy and has Scoliosis; Migraine; Asthma, mild intermittent; Seizures (HCC); History of preterm delivery, currently pregnant; History of substance abuse; Supervision of other high risk pregnancy, antepartum; and Short cervix during pregnancy in third trimester on their problem list.  Patient reports pelvic pressure since IC this past Sat. Denies vaginal bleeding or LOF.  Contractions: Irritability. Vag. Bleeding: None.   . Denies leaking of fluid.   The following portions of the patient's history were reviewed and updated as appropriate: allergies, current medications, past family history, past medical history, past social history, past surgical history and problem list. Problem list updated.  Objective:   Vitals:   05/11/18 0921  BP: 102/66  Pulse: 90  Weight: 141 lb 8 oz (64.2 kg)    Fetal Status: Fetal Heart Rate (bpm): 139         General:  Alert, oriented and cooperative. Patient is in no acute distress.  Skin: Skin is warm and dry. No rash noted.   Cardiovascular: Normal heart rate noted  Respiratory: Normal respiratory effort, no problems with respiration noted  Abdomen: Soft, gravid, appropriate for gestational age. Pain/Pressure: Present     Pelvic:  Cervical exam performed        Extremities: Normal range of motion.  Edema: None  Mental Status: Normal mood and affect. Normal behavior. Normal judgment and thought content.   Urinalysis:      Assessment and Plan:  Pregnancy: G3P1102 at 351w1d  1. History of preterm delivery, currently pregnant Stable Continue with 17 OHP Advised on pelvic rest until after 36 weeks - HYDROXYprogesterone Caproate SOAJ 275 mg  2. Supervision of other high risk pregnancy, antepartum Stable Growth scan tomorrow  3. Short cervix during pregnancy in third  trimester Continue with vaginal progesterone  Preterm labor symptoms and general obstetric precautions including but not limited to vaginal bleeding, contractions, leaking of fluid and fetal movement were reviewed in detail with the patient. Please refer to After Visit Summary for other counseling recommendations.  Return in about 2 weeks (around 05/25/2018) for OB visit.   Hermina StaggersErvin, Mats Jeanlouis L, MD

## 2018-05-12 ENCOUNTER — Encounter (HOSPITAL_COMMUNITY): Payer: Self-pay

## 2018-05-12 ENCOUNTER — Ambulatory Visit (HOSPITAL_COMMUNITY)
Admission: RE | Admit: 2018-05-12 | Discharge: 2018-05-12 | Disposition: A | Discharge status: Home / Self Care | Payer: Medicaid Other | Source: Ambulatory Visit | Attending: Obstetrics & Gynecology | Admitting: Obstetrics & Gynecology

## 2018-05-12 DIAGNOSIS — G40909 Epilepsy, unspecified, not intractable, without status epilepticus: Secondary | ICD-10-CM

## 2018-05-12 DIAGNOSIS — O09893 Supervision of other high risk pregnancies, third trimester: Secondary | ICD-10-CM | POA: Insufficient documentation

## 2018-05-12 DIAGNOSIS — O99350 Diseases of the nervous system complicating pregnancy, unspecified trimester: Secondary | ICD-10-CM | POA: Diagnosis not present

## 2018-05-12 DIAGNOSIS — O09213 Supervision of pregnancy with history of pre-term labor, third trimester: Secondary | ICD-10-CM

## 2018-05-12 DIAGNOSIS — Z3A34 34 weeks gestation of pregnancy: Secondary | ICD-10-CM | POA: Diagnosis not present

## 2018-05-12 DIAGNOSIS — O09899 Supervision of other high risk pregnancies, unspecified trimester: Secondary | ICD-10-CM

## 2018-05-12 DIAGNOSIS — O26843 Uterine size-date discrepancy, third trimester: Secondary | ICD-10-CM | POA: Insufficient documentation

## 2018-05-12 DIAGNOSIS — O3432 Maternal care for cervical incompetence, second trimester: Secondary | ICD-10-CM

## 2018-05-12 DIAGNOSIS — Z362 Encounter for other antenatal screening follow-up: Secondary | ICD-10-CM

## 2018-05-15 ENCOUNTER — Encounter (HOSPITAL_COMMUNITY): Payer: Self-pay | Admitting: *Deleted

## 2018-05-15 ENCOUNTER — Inpatient Hospital Stay (HOSPITAL_COMMUNITY)
Admission: AD | Admit: 2018-05-15 | Discharge: 2018-05-15 | Disposition: A | Discharge status: Home / Self Care | Payer: Medicaid Other | Source: Ambulatory Visit | Attending: Obstetrics and Gynecology | Admitting: Obstetrics and Gynecology

## 2018-05-15 DIAGNOSIS — O99891 Other specified diseases and conditions complicating pregnancy: Secondary | ICD-10-CM

## 2018-05-15 DIAGNOSIS — O99513 Diseases of the respiratory system complicating pregnancy, third trimester: Secondary | ICD-10-CM | POA: Diagnosis not present

## 2018-05-15 DIAGNOSIS — Z8249 Family history of ischemic heart disease and other diseases of the circulatory system: Secondary | ICD-10-CM | POA: Diagnosis not present

## 2018-05-15 DIAGNOSIS — Z9889 Other specified postprocedural states: Secondary | ICD-10-CM | POA: Insufficient documentation

## 2018-05-15 DIAGNOSIS — Z833 Family history of diabetes mellitus: Secondary | ICD-10-CM | POA: Insufficient documentation

## 2018-05-15 DIAGNOSIS — O26893 Other specified pregnancy related conditions, third trimester: Secondary | ICD-10-CM | POA: Diagnosis not present

## 2018-05-15 DIAGNOSIS — Z3A34 34 weeks gestation of pregnancy: Secondary | ICD-10-CM | POA: Diagnosis not present

## 2018-05-15 DIAGNOSIS — O9989 Other specified diseases and conditions complicating pregnancy, childbirth and the puerperium: Secondary | ICD-10-CM | POA: Diagnosis not present

## 2018-05-15 DIAGNOSIS — M549 Dorsalgia, unspecified: Secondary | ICD-10-CM | POA: Insufficient documentation

## 2018-05-15 DIAGNOSIS — R102 Pelvic and perineal pain: Secondary | ICD-10-CM | POA: Insufficient documentation

## 2018-05-15 DIAGNOSIS — Z79899 Other long term (current) drug therapy: Secondary | ICD-10-CM | POA: Diagnosis not present

## 2018-05-15 DIAGNOSIS — J45909 Unspecified asthma, uncomplicated: Secondary | ICD-10-CM | POA: Diagnosis not present

## 2018-05-15 DIAGNOSIS — N949 Unspecified condition associated with female genital organs and menstrual cycle: Secondary | ICD-10-CM

## 2018-05-15 LAB — URINALYSIS, ROUTINE W REFLEX MICROSCOPIC
Bacteria, UA: NONE SEEN
Bilirubin Urine: NEGATIVE
Glucose, UA: NEGATIVE mg/dL
Hgb urine dipstick: NEGATIVE
Ketones, ur: NEGATIVE mg/dL
Nitrite: NEGATIVE
Protein, ur: NEGATIVE mg/dL
Specific Gravity, Urine: 1.013 (ref 1.005–1.030)
WBC, UA: >50 WBC/hpf — ABNORMAL HIGH (ref 0–5)
pH: 8 (ref 5.0–8.0)

## 2018-05-15 MED ORDER — CYCLOBENZAPRINE HCL 10 MG PO TABS: 10.0000 mg | ORAL_TABLET | Freq: Two times a day (BID) | ORAL | 0 refills | Status: DC | PRN

## 2018-05-15 MED ORDER — CYCLOBENZAPRINE HCL 10 MG PO TABS
10.0000 mg | ORAL_TABLET | Freq: Once | ORAL | Status: AC
  Administered 2018-05-15: 10 mg via ORAL
  Filled 2018-05-15: qty 1

## 2018-05-15 NOTE — MAU Provider Note (Signed)
History     CSN: [id number]  Arrival date and time: 05/15/18 1155   First Provider Initiated Contact with Patient 05/15/18 1253      Chief Complaint  Patient presents with  . Abdominal Pain  . Back Pain  . Pelvic Pain   Erin Price is a 27 y.o. [id number] at [redacted]w[redacted]d who presents today with back pain and lower abdominal pain. She denies any VB, LOF or contractions. She reports normal fetal movement. She has not taken anything for pain today.   Abdominal Pain  Pertinent negatives include no dysuria, fever, frequency, nausea or vomiting.  Back Pain  Associated symptoms include abdominal pain and pelvic pain. Pertinent negatives include no dysuria or fever.  Pelvic Pain  The patient's primary symptoms include pelvic pain. The patient's pertinent negatives include no vaginal discharge. This is a new problem. The current episode started yesterday. The problem occurs constantly. The problem has been gradually worsening. Pain severity now: 8/10. The problem affects both sides. She is pregnant. Associated symptoms include abdominal pain and back pain. Pertinent negatives include no chills, dysuria, fever, frequency, nausea, urgency or vomiting. The vaginal discharge was normal. There has been no bleeding. Nothing aggravates the symptoms. She has tried nothing for the symptoms.    OB History    Gravida  3   Para  2   Term  1   Preterm  1   AB      Living  2     SAB      TAB      Ectopic      Multiple      Live Births  2        Obstetric Comments  G1: 37wks. 7lbs 4oz.         Past Medical History:  Diagnosis Date  . 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 Asthma   . Closed head injury   . Migraine   . Scoliosis   . Seizures (HCC)    last seizure May 2018  . Status epilepticus (HCC) 05/19/2015    Past Surgical History:  Procedure Laterality Date  . BACK SURGERY     screws placed for scoliosis    Family History   Problem Relation Age of Onset  . Hypertension Father   . Diabetes Maternal Grandmother   . Hypertension Maternal Grandmother   . Diabetes Paternal Grandmother   . Hypertension Paternal Grandmother   . Anesthesia problems Neg Hx     Social History   Tobacco Use  . Smoking status: Never Smoker  . Smokeless tobacco: Never Used  Substance Use Topics  . Alcohol use: No  . Drug use: Not Currently    Types: Marijuana    Comment: last time was 10/22/2017    Allergies: No Known Allergies  Facility-Administered Medications Prior to Admission  Medication Dose Route Frequency Provider Last Rate Last Dose  . HYDROXYprogesterone Caproate SOAJ 275 mg  275 mg Subcutaneous Weekly Levie Heritage, DO   275 mg at 05/11/18 1022  . HYDROXYprogesterone Caproate SOAJ 275 mg  275 mg Subcutaneous Once Hermina Staggers, MD       Medications Prior to Admission  Medication Sig Dispense Refill Last Dose  . Prenatal Multivit-Min-Fe-FA (PRENATAL VITAMINS) 0.8 MG tablet Take 1 tablet by mouth daily. 30 tablet 12 Taking  . progesterone (PROMETRIUM) 200 MG capsule Place 200 mg vaginally daily.   Taking    Review of Systems  Constitutional:  Negative for chills and fever.  Gastrointestinal: Positive for abdominal pain. Negative for nausea and vomiting.  Genitourinary: Positive for pelvic pain. Negative for dysuria, frequency, urgency, vaginal bleeding and vaginal discharge.  Musculoskeletal: Positive for back pain.   Physical Exam   Blood pressure 112/65, pulse 95, temperature 98.6 F (37 C), temperature source Oral, resp. rate 16, weight 64.9 kg.  Physical Exam  Nursing note and vitals reviewed. Constitutional: She is oriented to person, place, and time. She appears well-developed and well-nourished. No distress.  HENT:  Head: Normocephalic.  Cardiovascular: Normal rate.  Respiratory: Effort normal.  GI: Soft. There is no tenderness. There is no rebound.  Genitourinary:  Genitourinary Comments:  Cervix: 3/thick/-3   Neurological: She is alert and oriented to person, place, and time.  Skin: Skin is warm and dry.  Psychiatric: She has a normal mood and affect.   NST:  Baseline: 140 Variability: moderate Accels: 15x15 Decels: none Toco: none   Results for orders placed or performed during the hospital encounter of 05/15/18 (from the past 24 hour(s))  Urinalysis, Routine w reflex microscopic     Status: Abnormal   Collection Time: 05/15/18 12:10 PM  Result Value Ref Range   Color, Urine YELLOW YELLOW   APPearance CLOUDY (A) CLEAR   Specific Gravity, Urine 1.013 1.005 - 1.030   pH 8.0 5.0 - 8.0   Glucose, UA NEGATIVE NEGATIVE mg/dL   Hgb urine dipstick NEGATIVE NEGATIVE   Bilirubin Urine NEGATIVE NEGATIVE   Ketones, ur NEGATIVE NEGATIVE mg/dL   Protein, ur NEGATIVE NEGATIVE mg/dL   Nitrite NEGATIVE NEGATIVE   Leukocytes, UA MODERATE (A) NEGATIVE   RBC / HPF 0-5 0 - 5 RBC/hpf   WBC, UA >50 (H) 0 - 5 WBC/hpf   Bacteria, UA NONE SEEN NONE SEEN   Squamous Epithelial / LPF 0-5 0 - 5   Mucus PRESENT      MAU Course  Procedures  MDM 1:58 PM patient has had flexeril. She reports that her pain has improved. No cervical change on re-check.   Assessment and Plan   1. Back pain affecting pregnancy in third trimester   2. Round ligament pain   3. [redacted] weeks gestation of pregnancy    DC home Comfort measures reviewed  3rd Trimester precautions  PTL precautions  Fetal kick counts RX: flexeril 10mg  PRN #20  Return to MAU as needed FU with OB as planned  Follow-up Information    [facility].   Specialty:  Obstetrics and Gynecology Contact information: [address] [place] [id number] [phone]           Thressa ShellerHeather Hogan 05/15/2018, 12:57 PM

## 2018-05-15 NOTE — Discharge Instructions (Signed)

## 2018-05-15 NOTE — MAU Note (Signed)
Pt C/O lower abdominal cramping, back pain & pelvic pain - started last night, is worse this morning.  Denies bleeding or LOF.  Reports good fetal movement.

## 2018-05-18 ENCOUNTER — Ambulatory Visit (INDEPENDENT_AMBULATORY_CARE_PROVIDER_SITE_OTHER): Payer: Medicaid Other | Admitting: *Deleted

## 2018-05-18 VITALS — BP 111/63 | HR 88 | Ht 60.0 in | Wt 142.4 lb

## 2018-05-18 DIAGNOSIS — O09899 Supervision of other high risk pregnancies, unspecified trimester: Secondary | ICD-10-CM

## 2018-05-18 DIAGNOSIS — O09213 Supervision of pregnancy with history of pre-term labor, third trimester: Secondary | ICD-10-CM

## 2018-05-18 DIAGNOSIS — O09219 Supervision of pregnancy with history of pre-term labor, unspecified trimester: Principal | ICD-10-CM

## 2018-05-18 NOTE — Progress Notes (Signed)
Makena 275 mg administered as scheduled.  Pt tolerated well. Pt declines final Makena injection for next week.

## 2018-05-28 ENCOUNTER — Encounter: Payer: Self-pay | Admitting: Obstetrics and Gynecology

## 2018-05-28 ENCOUNTER — Other Ambulatory Visit (HOSPITAL_COMMUNITY)
Admission: RE | Admit: 2018-05-28 | Discharge: 2018-05-28 | Disposition: A | Discharge status: Home / Self Care | Payer: Medicaid Other | Source: Ambulatory Visit | Attending: Obstetrics and Gynecology | Admitting: Obstetrics and Gynecology

## 2018-05-28 ENCOUNTER — Ambulatory Visit (INDEPENDENT_AMBULATORY_CARE_PROVIDER_SITE_OTHER): Payer: Medicaid Other | Admitting: Obstetrics and Gynecology

## 2018-05-28 VITALS — BP 114/60 | HR 92 | Wt 149.3 lb

## 2018-05-28 DIAGNOSIS — O09213 Supervision of pregnancy with history of pre-term labor, third trimester: Secondary | ICD-10-CM | POA: Insufficient documentation

## 2018-05-28 DIAGNOSIS — O09899 Supervision of other high risk pregnancies, unspecified trimester: Secondary | ICD-10-CM

## 2018-05-28 DIAGNOSIS — Z3A36 36 weeks gestation of pregnancy: Secondary | ICD-10-CM | POA: Diagnosis not present

## 2018-05-28 DIAGNOSIS — O26873 Cervical shortening, third trimester: Secondary | ICD-10-CM | POA: Diagnosis not present

## 2018-05-28 DIAGNOSIS — O09893 Supervision of other high risk pregnancies, third trimester: Secondary | ICD-10-CM | POA: Diagnosis not present

## 2018-05-28 DIAGNOSIS — O09219 Supervision of pregnancy with history of pre-term labor, unspecified trimester: Principal | ICD-10-CM

## 2018-05-28 NOTE — Patient Instructions (Signed)
Vaginal Delivery Vaginal delivery means that you will give birth by pushing your baby out of your birth canal (vagina). A team of health care providers will help you before, during, and after vaginal delivery. Birth experiences are unique for every woman and every pregnancy, and birth experiences vary depending on where you choose to give birth. What should I do to prepare for my baby's birth? Before your baby is born, it is important to talk with your health care provider about:  Your labor and delivery preferences. These may include: ? Medicines that you may be given. ? How you will manage your pain. This might include non-medical pain relief techniques or injectable pain relief such as epidural analgesia. ? How you and your baby will be monitored during labor and delivery. ? Who may be in the labor and delivery room with you. ? Your feelings about surgical delivery of your baby (cesarean delivery, or C-section) if this becomes necessary. ? Your feelings about receiving donated blood through an IV tube (blood transfusion) if this becomes necessary.  Whether you are able: ? To take pictures or videos of the birth. ? To eat during labor and delivery. ? To move around, walk, or change positions during labor and delivery.  What to expect after your baby is born, such as: ? Whether delayed umbilical cord clamping and cutting is offered. ? Who will care for your baby right after birth. ? Medicines or tests that may be recommended for your baby. ? Whether breastfeeding is supported in your hospital or birth center. ? How long you will be in the hospital or birth center.  How any medical conditions you have may affect your baby or your labor and delivery experience.  To prepare for your baby's birth, you should also:  Attend all of your health care visits before delivery (prenatal visits) as recommended by your health care provider. This is important.  Prepare your home for your baby's  arrival. Make sure that you have: ? Diapers. ? Baby clothing. ? Feeding equipment. ? Safe sleeping arrangements for you and your baby.  Install a car seat in your vehicle. Have your car seat checked by a certified car seat installer to make sure that it is installed safely.  Think about who will help you with your new baby at home for at least the first several weeks after delivery.  What can I expect when I arrive at the birth center or hospital? Once you are in labor and have been admitted into the hospital or birth center, your health care provider may:  Review your pregnancy history and any concerns you have.  Insert an IV tube into one of your veins. This is used to give you fluids and medicines.  Check your blood pressure, pulse, temperature, and heart rate (vital signs).  Check whether your bag of water (amniotic sac) has broken (ruptured).  Talk with you about your birth plan and discuss pain control options.  Monitoring Your health care provider may monitor your contractions (uterine monitoring) and your baby's heart rate (fetal monitoring). You may need to be monitored:  Often, but not continuously (intermittently).  All the time or for long periods at a time (continuously). Continuous monitoring may be needed if: ? You are taking certain medicines, such as medicine to relieve pain or make your contractions stronger. ? You have pregnancy or labor complications.  Monitoring may be done by:  Placing a special stethoscope or a handheld monitoring device on your abdomen to   check your baby's heartbeat, and feeling your abdomen for contractions. This method of monitoring does not continuously record your baby's heartbeat or your contractions.  Placing monitors on your abdomen (external monitors) to record your baby's heartbeat and the frequency and length of contractions. You may not have to wear external monitors all the time.  Placing monitors inside of your uterus  (internal monitors) to record your baby's heartbeat and the frequency, length, and strength of your contractions. ? Your health care provider may use internal monitors if he or she needs more information about the strength of your contractions or your baby's heart rate. ? Internal monitors are put in place by passing a thin, flexible wire through your vagina and into your uterus. Depending on the type of monitor, it may remain in your uterus or on your baby's head until birth. ? Your health care provider will discuss the benefits and risks of internal monitoring with you and will ask for your permission before inserting the monitors.  Telemetry. This is a type of continuous monitoring that can be done with external or internal monitors. Instead of having to stay in bed, you are able to move around during telemetry. Ask your health care provider if telemetry is an option for you.  Physical exam Your health care provider may perform a physical exam. This may include:  Checking whether your baby is positioned: ? With the head toward your vagina (head-down). This is most common. ? With the head toward the top of your uterus (head-up or breech). If your baby is in a breech position, your health care provider may try to turn your baby to a head-down position so you can deliver vaginally. If it does not seem that your baby can be born vaginally, your provider may recommend surgery to deliver your baby. In rare cases, you may be able to deliver vaginally if your baby is head-up (breech delivery). ? Lying sideways (transverse). Babies that are lying sideways cannot be delivered vaginally.  Checking your cervix to determine: ? Whether it is thinning out (effacing). ? Whether it is opening up (dilating). ? How low your baby has moved into your birth canal.  What are the three stages of labor and delivery?  Normal labor and delivery is divided into the following three stages: Stage 1  Stage 1 is the  longest stage of labor, and it can last for hours or days. Stage 1 includes: ? Early labor. This is when contractions may be irregular, or regular and mild. Generally, early labor contractions are more than 10 minutes apart. ? Active labor. This is when contractions get longer, more regular, more frequent, and more intense. ? The transition phase. This is when contractions happen very close together, are very intense, and may last longer than during any other part of labor.  Contractions generally feel mild, infrequent, and irregular at first. They get stronger, more frequent (about every 2-3 minutes), and more regular as you progress from early labor through active labor and transition.  Many women progress through stage 1 naturally, but you may need help to continue making progress. If this happens, your health care provider may talk with you about: ? Rupturing your amniotic sac if it has not ruptured yet. ? Giving you medicine to help make your contractions stronger and more frequent.  Stage 1 ends when your cervix is completely dilated to 4 inches (10 cm) and completely effaced. This happens at the end of the transition phase. Stage 2  Once   your cervix is completely effaced and dilated to 4 inches (10 cm), you may start to feel an urge to push. It is common for the body to naturally take a rest before feeling the urge to push, especially if you received an epidural or certain other pain medicines. This rest period may last for up to 1-2 hours, depending on your unique labor experience.  During stage 2, contractions are generally less painful, because pushing helps relieve contraction pain. Instead of contraction pain, you may feel stretching and burning pain, especially when the widest part of your baby's head passes through the vaginal opening (crowning).  Your health care provider will closely monitor your pushing progress and your baby's progress through the vagina during stage 2.  Your  health care provider may massage the area of skin between your vaginal opening and anus (perineum) or apply warm compresses to your perineum. This helps it stretch as the baby's head starts to crown, which can help prevent perineal tearing. ? In some cases, an incision may be made in your perineum (episiotomy) to allow the baby to pass through the vaginal opening. An episiotomy helps to make the opening of the vagina larger to allow more room for the baby to fit through.  It is very important to breathe and focus so your health care provider can control the delivery of your baby's head. Your health care provider may have you decrease the intensity of your pushing, to help prevent perineal tearing.  After delivery of your baby's head, the shoulders and the rest of the body generally deliver very quickly and without difficulty.  Once your baby is delivered, the umbilical cord may be cut right away, or this may be delayed for 1-2 minutes, depending on your baby's health. This may vary among health care providers, hospitals, and birth centers.  If you and your baby are healthy enough, your baby may be placed on your chest or abdomen to help maintain the baby's temperature and to help you bond with each other. Some mothers and babies start breastfeeding at this time. Your health care team will dry your baby and help keep your baby warm during this time.  Your baby may need immediate care if he or she: ? Showed signs of distress during labor. ? Has a medical condition. ? Was born too early (prematurely). ? Had a bowel movement before birth (meconium). ? Shows signs of difficulty transitioning from being inside the uterus to being outside of the uterus. If you are planning to breastfeed, your health care team will help you begin a feeding. Stage 3  The third stage of labor starts immediately after the birth of your baby and ends after you deliver the placenta. The placenta is an organ that develops  during pregnancy to provide oxygen and nutrients to your baby in the womb.  Delivering the placenta may require some pushing, and you may have mild contractions. Breastfeeding can stimulate contractions to help you deliver the placenta.  After the placenta is delivered, your uterus should tighten (contract) and become firm. This helps to stop bleeding in your uterus. To help your uterus contract and to control bleeding, your health care provider may: ? Give you medicine by injection, through an IV tube, by mouth, or through your rectum (rectally). ? Massage your abdomen or perform a vaginal exam to remove any blood clots that are left in your uterus. ? Empty your bladder by placing a thin, flexible tube (catheter) into your bladder. ? Encourage   you to breastfeed your baby. After labor is over, you and your baby will be monitored closely to ensure that you are both healthy until you are ready to go home. Your health care team will teach you how to care for yourself and your baby. This information is not intended to replace advice given to you by your health care provider. Make sure you discuss any questions you have with your health care provider. Document Released: 06/18/2008 Document Revised: 03/29/2016 Document Reviewed: 09/24/2015 Elsevier Interactive Patient Education  2018 [employer]  

## 2018-05-28 NOTE — Progress Notes (Signed)
States has been having uc's since she got here- about 5 uc'sin 30 minutes. Also has some last night and earlier this am.

## 2018-05-28 NOTE — Progress Notes (Signed)
Subjective:  Erin Price is a 27 y.o. G3P1102 at [redacted]w[redacted]d being seen today for ongoing prenatal care.  She is currently monitored for the following issues for this high-risk pregnancy and has Scoliosis; Migraine; Asthma, mild intermittent; Seizures (HCC); History of preterm delivery, currently pregnant; History of substance abuse; Supervision of other high risk pregnancy, antepartum; and Short cervix during pregnancy in third trimester on their problem list.  Patient reports occasional contractions.  Contractions: Irregular. Vag. Bleeding: None.  Movement: Present. Denies leaking of fluid.   The following portions of the patient's history were reviewed and updated as appropriate: allergies, current medications, past family history, past medical history, past social history, past surgical history and problem list. Problem list updated.  Objective:   Vitals:   05/28/18 1123  BP: 114/60  Pulse: 92  Weight: 149 lb 4.8 oz (67.7 kg)    Fetal Status:     Movement: Present     General:  Alert, oriented and cooperative. Patient is in no acute distress.  Skin: Skin is warm and dry. No rash noted.   Cardiovascular: Normal heart rate noted  Respiratory: Normal respiratory effort, no problems with respiration noted  Abdomen: Soft, gravid, appropriate for gestational age. Pain/Pressure: Present     Pelvic:  Cervical exam performed        Extremities: Normal range of motion.  Edema: Mild pitting, slight indentation  Mental Status: Normal mood and affect. Normal behavior. Normal judgment and thought content.   Urinalysis:      Assessment and Plan:  Pregnancy: G3P1102 at [redacted]w[redacted]d  1. History of preterm delivery, currently pregnant Has completed 17 OHP and vaginal progesterone - Culture, beta strep (group b only) - GC/Chlamydia probe amp ([place])not at [facility]  2. Supervision of other high risk pregnancy, antepartum Labor precautions - Culture, beta strep (group b only) - GC/Chlamydia probe  amp ([place])not at [facility]  3. Short cervix during pregnancy in third trimester - Culture, beta strep (group b only) - GC/Chlamydia probe amp ([place])not at [facility]  Term labor symptoms and general obstetric precautions including but not limited to vaginal bleeding, contractions, leaking of fluid and fetal movement were reviewed in detail with the patient. Please refer to After Visit Summary for other counseling recommendations.  No follow-ups on file.   Hermina Staggers, MD

## 2018-05-29 LAB — GC/CHLAMYDIA PROBE AMP (~~LOC~~) NOT AT ARMC
Chlamydia: NEGATIVE
Neisseria Gonorrhea: NEGATIVE

## 2018-06-01 LAB — CULTURE, BETA STREP (GROUP B ONLY): Strep Gp B Culture: NEGATIVE

## 2018-06-03 ENCOUNTER — Ambulatory Visit (INDEPENDENT_AMBULATORY_CARE_PROVIDER_SITE_OTHER): Payer: Medicaid Other | Admitting: Student

## 2018-06-03 VITALS — BP 114/68 | HR 85 | Wt 150.3 lb

## 2018-06-03 DIAGNOSIS — O09893 Supervision of other high risk pregnancies, third trimester: Secondary | ICD-10-CM

## 2018-06-03 DIAGNOSIS — Z23 Encounter for immunization: Secondary | ICD-10-CM | POA: Diagnosis not present

## 2018-06-03 DIAGNOSIS — O09899 Supervision of other high risk pregnancies, unspecified trimester: Secondary | ICD-10-CM

## 2018-06-03 NOTE — Progress Notes (Signed)
   PRENATAL VISIT NOTE  Subjective:  Erin Price is a 27 y.o. G3P1102 at [redacted]w[redacted]d being seen today for ongoing prenatal care.  She is currently monitored for the following issues for this high-risk pregnancy and has Scoliosis; Migraine; Asthma, mild intermittent; Seizures (HCC); History of preterm delivery, currently pregnant; History of substance abuse; and Supervision of other high risk pregnancy, antepartum on their problem list.  Patient reports no complaints.  Contractions: Irregular. Vag. Bleeding: None.  Movement: Present. Denies leaking of fluid.   The following portions of the patient's history were reviewed and updated as appropriate: allergies, current medications, past family history, past medical history, past social history, past surgical history and problem list. Problem list updated.  Objective:   Vitals:   06/03/18 1405  BP: 114/68  Pulse: 85  Weight: 150 lb 4.8 oz (68.2 kg)    Fetal Status: Fetal Heart Rate (bpm): 136 Fundal Height: 36 cm Movement: Present  Presentation: Vertex  General:  Alert, oriented and cooperative. Patient is in no acute distress.  Skin: Skin is warm and dry. No rash noted.   Cardiovascular: Normal heart rate noted  Respiratory: Normal respiratory effort, no problems with respiration noted  Abdomen: Soft, gravid, appropriate for gestational age.  Pain/Pressure: Present     Pelvic: Cervical exam deferred        Extremities: Normal range of motion.  Edema: Moderate pitting, indentation subsides rapidly  Mental Status: Normal mood and affect. Normal behavior. Normal judgment and thought content.   Assessment and Plan:  Pregnancy: G3P1102 at [redacted]w[redacted]d  1. Supervision of other high risk pregnancy, antepartum -doing well -Using [facility] - Flu Vaccine QUAD 36+ mos IM  Term labor symptoms and general obstetric precautions including but not limited to vaginal bleeding, contractions, leaking of fluid and fetal movement were reviewed in  detail with the patient. Please refer to After Visit Summary for other counseling recommendations.  Return in about 1 week (around 06/10/2018) for Routine OB.  No future appointments.  Judeth Horn, NP

## 2018-06-03 NOTE — Patient Instructions (Signed)
Before Baby Comes Home  Before your baby arrives it is important to:   Have all of the supplies that you will need to care for your baby.   Know where to go if there is an emergency.   Discuss the baby's arrival with other family members.    What supplies will I need?    It is recommended that you have the following supplies:  Large Items   Crib.   Crib mattress.   Rear-facing infant car seat. If possible, have a trained professional check to make sure that it is installed correctly.    Feeding   6-8 bottles that are 4-5 oz in size.   6-8 nipples.   Bottle brush.   Sterilizer, or a large pan or kettle with a lid.   A way to boil and cool water.   If you will be breastfeeding:  ? Breast pump.  ? Nipple cream.  ? Nursing bra.  ? Breast pads.  ? Breast shields.   If you will be formula feeding:  ? Formula.  ? Measuring cups.  ? Measuring spoons.    Bathing   Mild baby soap and baby shampoo.   Petroleum jelly.   Soft cloth towel and washcloth.   Hooded towel.   Cotton balls.   Bath basin.    Other Supplies   Rectal thermometer.   Bulb syringe.   Baby wipes or washcloths for diaper changes.   Diaper bag.   Changing pad.   Clothing, including one-piece outfits and pajamas.   Baby nail clippers.   Receiving blankets.   Mattress pad and sheets for the crib.   Night-light for the baby's room.   Baby monitor.   2 or 3 pacifiers.   Either 24-36 cloth diapers and waterproof diaper covers or a box of disposable diapers. You may need to use as many as 10-12 diapers per day.    How do I prepare for an emergency?  Prepare for an emergency by:   Knowing how to get to the nearest hospital.   Listing the phone numbers of your baby's health care providers near your home phone and in your cell phone.    How do I prepare my family?   Decide how to handle visitors.   If you have other children:  ? Talk with them about the baby coming home. Ask them how they feel about it.  ? Read a book together about  being a new big brother or sister.  ? Find ways to let them help you prepare for the new baby.  ? Have someone ready to care for them while you are in the hospital.  This information is not intended to replace advice given to you by your health care provider. Make sure you discuss any questions you have with your health care provider.  Document Released: 08/22/2008 Document Revised: 02/15/2016 Document Reviewed: 08/17/2014  Elsevier Interactive Patient Education  2018 [employer]

## 2018-06-05 ENCOUNTER — Inpatient Hospital Stay (HOSPITAL_COMMUNITY)
Admission: AD | Admit: 2018-06-05 | Discharge: 2018-06-07 | DRG: 807 | Disposition: A | Discharge status: Home / Self Care | Payer: Medicaid Other | Attending: Obstetrics & Gynecology | Admitting: Obstetrics & Gynecology

## 2018-06-05 ENCOUNTER — Inpatient Hospital Stay (HOSPITAL_COMMUNITY): Payer: Medicaid Other | Admitting: Anesthesiology

## 2018-06-05 ENCOUNTER — Other Ambulatory Visit: Payer: Self-pay

## 2018-06-05 ENCOUNTER — Encounter (HOSPITAL_COMMUNITY): Payer: Self-pay | Admitting: *Deleted

## 2018-06-05 DIAGNOSIS — G43909 Migraine, unspecified, not intractable, without status migrainosus: Secondary | ICD-10-CM | POA: Diagnosis present

## 2018-06-05 DIAGNOSIS — J4520 Mild intermittent asthma, uncomplicated: Secondary | ICD-10-CM | POA: Diagnosis present

## 2018-06-05 DIAGNOSIS — F1911 Other psychoactive substance abuse, in remission: Secondary | ICD-10-CM

## 2018-06-05 DIAGNOSIS — O9952 Diseases of the respiratory system complicating childbirth: Secondary | ICD-10-CM | POA: Diagnosis present

## 2018-06-05 DIAGNOSIS — Z3A37 37 weeks gestation of pregnancy: Secondary | ICD-10-CM

## 2018-06-05 DIAGNOSIS — O09899 Supervision of other high risk pregnancies, unspecified trimester: Secondary | ICD-10-CM

## 2018-06-05 DIAGNOSIS — Z3483 Encounter for supervision of other normal pregnancy, third trimester: Secondary | ICD-10-CM | POA: Diagnosis present

## 2018-06-05 DIAGNOSIS — M419 Scoliosis, unspecified: Secondary | ICD-10-CM | POA: Diagnosis present

## 2018-06-05 DIAGNOSIS — O09219 Supervision of pregnancy with history of pre-term labor, unspecified trimester: Secondary | ICD-10-CM

## 2018-06-05 HISTORY — DX: Chlamydial infection, unspecified: A74.9

## 2018-06-05 HISTORY — DX: Unspecified ovarian cyst, unspecified side: N83.209

## 2018-06-05 LAB — CBC
HCT: 35.6 % — ABNORMAL LOW (ref 36.0–46.0)
Hemoglobin: 12.1 g/dL (ref 12.0–15.0)
MCH: 30.9 pg (ref 26.0–34.0)
MCHC: 34 g/dL (ref 30.0–36.0)
MCV: 90.8 fL (ref 78.0–100.0)
Platelets: 225 10*3/uL (ref 150–400)
RBC: 3.92 MIL/uL (ref 3.87–5.11)
RDW: 13.2 % (ref 11.5–15.5)
WBC: 14.8 10*3/uL — ABNORMAL HIGH (ref 4.0–10.5)

## 2018-06-05 LAB — TYPE AND SCREEN
ABO/RH(D): O POS
Antibody Screen: NEGATIVE

## 2018-06-05 LAB — RPR: RPR Ser Ql: NONREACTIVE

## 2018-06-05 MED ORDER — MEASLES, MUMPS & RUBELLA VAC ~~LOC~~ INJ: 0.5000 mL | INJECTION | Freq: Once | SUBCUTANEOUS | Status: DC

## 2018-06-05 MED ORDER — EPHEDRINE 5 MG/ML INJ
10.0000 mg | INTRAVENOUS | Status: DC | PRN
  Filled 2018-06-05: qty 2

## 2018-06-05 MED ORDER — DIBUCAINE 1 % RE OINT: 1.0000 "application " | TOPICAL_OINTMENT | RECTAL | Status: DC | PRN

## 2018-06-05 MED ORDER — BENZOCAINE-MENTHOL 20-0.5 % EX AERO: 1.0000 "application " | INHALATION_SPRAY | CUTANEOUS | Status: DC | PRN

## 2018-06-05 MED ORDER — METHYLERGONOVINE MALEATE 0.2 MG/ML IJ SOLN
0.2000 mg | Freq: Once | INTRAMUSCULAR | Status: AC
  Administered 2018-06-05: 0.2 mg via INTRAMUSCULAR

## 2018-06-05 MED ORDER — SENNOSIDES-DOCUSATE SODIUM 8.6-50 MG PO TABS
2.0000 | ORAL_TABLET | ORAL | Status: DC
  Administered 2018-06-05 – 2018-06-06 (×2): 2 via ORAL
  Filled 2018-06-05 (×2): qty 2

## 2018-06-05 MED ORDER — PHENYLEPHRINE 40 MCG/ML (10ML) SYRINGE FOR IV PUSH (FOR BLOOD PRESSURE SUPPORT)
80.0000 ug | PREFILLED_SYRINGE | INTRAVENOUS | Status: DC | PRN
  Administered 2018-06-05: 80 ug via INTRAVENOUS
  Filled 2018-06-05: qty 5

## 2018-06-05 MED ORDER — OXYCODONE-ACETAMINOPHEN 5-325 MG PO TABS: 2.0000 | ORAL_TABLET | ORAL | Status: DC | PRN

## 2018-06-05 MED ORDER — OXYTOCIN BOLUS FROM INFUSION
500.0000 mL | Freq: Once | INTRAVENOUS | Status: AC
  Administered 2018-06-05: 500 mL via INTRAVENOUS

## 2018-06-05 MED ORDER — LIDOCAINE HCL (PF) 1 % IJ SOLN
30.0000 mL | INTRAMUSCULAR | Status: DC | PRN
  Filled 2018-06-05: qty 30

## 2018-06-05 MED ORDER — SIMETHICONE 80 MG PO CHEW: 80.0000 mg | CHEWABLE_TABLET | ORAL | Status: DC | PRN

## 2018-06-05 MED ORDER — PHENYLEPHRINE 40 MCG/ML (10ML) SYRINGE FOR IV PUSH (FOR BLOOD PRESSURE SUPPORT)
80.0000 ug | PREFILLED_SYRINGE | INTRAVENOUS | Status: DC | PRN
  Filled 2018-06-05: qty 5
  Filled 2018-06-05: qty 10

## 2018-06-05 MED ORDER — LACTATED RINGERS IV SOLN: 500.0000 mL | INTRAVENOUS | Status: DC | PRN

## 2018-06-05 MED ORDER — ONDANSETRON HCL 4 MG/2ML IJ SOLN
4.0000 mg | Freq: Four times a day (QID) | INTRAMUSCULAR | Status: DC | PRN
  Administered 2018-06-05: 4 mg via INTRAVENOUS
  Filled 2018-06-05: qty 2

## 2018-06-05 MED ORDER — OXYTOCIN 40 UNITS IN LACTATED RINGERS INFUSION - SIMPLE MED
2.5000 [IU]/h | INTRAVENOUS | Status: DC
  Filled 2018-06-05: qty 1000

## 2018-06-05 MED ORDER — DOCUSATE SODIUM 100 MG PO CAPS
100.0000 mg | ORAL_CAPSULE | Freq: Two times a day (BID) | ORAL | Status: DC
  Administered 2018-06-05 – 2018-06-07 (×4): 100 mg via ORAL
  Filled 2018-06-05 (×4): qty 1

## 2018-06-05 MED ORDER — PRENATAL MULTIVITAMIN CH
1.0000 | ORAL_TABLET | Freq: Every day | ORAL | Status: DC
  Administered 2018-06-05 – 2018-06-07 (×3): 1 via ORAL
  Filled 2018-06-05 (×3): qty 1

## 2018-06-05 MED ORDER — FERROUS SULFATE 325 (65 FE) MG PO TABS
325.0000 mg | ORAL_TABLET | Freq: Two times a day (BID) | ORAL | Status: DC
  Administered 2018-06-05 – 2018-06-07 (×4): 325 mg via ORAL
  Filled 2018-06-05 (×4): qty 1

## 2018-06-05 MED ORDER — FENTANYL 2.5 MCG/ML BUPIVACAINE 1/10 % EPIDURAL INFUSION (WH - ANES)
14.0000 mL/h | INTRAMUSCULAR | Status: DC | PRN
  Administered 2018-06-05: 14 mL/h via EPIDURAL
  Filled 2018-06-05: qty 100

## 2018-06-05 MED ORDER — ACETAMINOPHEN 325 MG PO TABS: 650.0000 mg | ORAL_TABLET | ORAL | Status: DC | PRN

## 2018-06-05 MED ORDER — ONDANSETRON HCL 4 MG/2ML IJ SOLN: 4.0000 mg | INTRAMUSCULAR | Status: DC | PRN

## 2018-06-05 MED ORDER — OXYCODONE HCL 5 MG PO TABS: 5.0000 mg | ORAL_TABLET | ORAL | Status: DC | PRN

## 2018-06-05 MED ORDER — ZOLPIDEM TARTRATE 5 MG PO TABS: 5.0000 mg | ORAL_TABLET | Freq: Every evening | ORAL | Status: DC | PRN

## 2018-06-05 MED ORDER — IBUPROFEN 600 MG PO TABS
600.0000 mg | ORAL_TABLET | Freq: Four times a day (QID) | ORAL | Status: DC
  Administered 2018-06-05 – 2018-06-07 (×8): 600 mg via ORAL
  Filled 2018-06-05 (×8): qty 1

## 2018-06-05 MED ORDER — OXYCODONE HCL 5 MG PO TABS: 10.0000 mg | ORAL_TABLET | ORAL | Status: DC | PRN

## 2018-06-05 MED ORDER — OXYCODONE-ACETAMINOPHEN 5-325 MG PO TABS: 1.0000 | ORAL_TABLET | ORAL | Status: DC | PRN

## 2018-06-05 MED ORDER — LACTATED RINGERS IV SOLN: INTRAVENOUS | Status: DC

## 2018-06-05 MED ORDER — WITCH HAZEL-GLYCERIN EX PADS: 1.0000 "application " | MEDICATED_PAD | CUTANEOUS | Status: DC | PRN

## 2018-06-05 MED ORDER — TETANUS-DIPHTH-ACELL PERTUSSIS 5-2.5-18.5 LF-MCG/0.5 IM SUSP: 0.5000 mL | Freq: Once | INTRAMUSCULAR | Status: DC

## 2018-06-05 MED ORDER — ONDANSETRON HCL 4 MG PO TABS: 4.0000 mg | ORAL_TABLET | ORAL | Status: DC | PRN

## 2018-06-05 MED ORDER — SODIUM BICARBONATE 8.4 % IV SOLN
INTRAVENOUS | Status: DC | PRN
  Administered 2018-06-05: 3 mL via EPIDURAL
  Administered 2018-06-05 (×2): 4 mL via EPIDURAL

## 2018-06-05 MED ORDER — LACTATED RINGERS IV SOLN
500.0000 mL | Freq: Once | INTRAVENOUS | Status: AC
  Administered 2018-06-05: 500 mL via INTRAVENOUS

## 2018-06-05 MED ORDER — SOD CITRATE-CITRIC ACID 500-334 MG/5ML PO SOLN: 30.0000 mL | ORAL | Status: DC | PRN

## 2018-06-05 MED ORDER — COCONUT OIL OIL: 1.0000 "application " | TOPICAL_OIL | Status: DC | PRN

## 2018-06-05 MED ORDER — FENTANYL CITRATE (PF) 100 MCG/2ML IJ SOLN: 100.0000 ug | INTRAMUSCULAR | Status: DC | PRN

## 2018-06-05 MED ORDER — DIPHENHYDRAMINE HCL 50 MG/ML IJ SOLN: 12.5000 mg | INTRAMUSCULAR | Status: DC | PRN

## 2018-06-05 MED ORDER — LACTATED RINGERS IV SOLN
INTRAVENOUS | Status: DC
  Administered 2018-06-05: 08:00:00 via INTRAVENOUS

## 2018-06-05 MED ORDER — DIPHENHYDRAMINE HCL 25 MG PO CAPS: 25.0000 mg | ORAL_CAPSULE | Freq: Four times a day (QID) | ORAL | Status: DC | PRN

## 2018-06-05 NOTE — Anesthesia Preprocedure Evaluation (Signed)
Anesthesia Evaluation  Patient identified by MRN, date of birth, ID band Patient awake    Reviewed: Allergy & Precautions, NPO status , Patient's Chart, lab work & pertinent test results  Airway Mallampati: I  TM Distance: >3 FB Neck ROM: Full    Dental no notable dental hx. (+) Teeth Intact, Dental Advisory Given   Pulmonary asthma ,    Pulmonary exam normal breath sounds clear to auscultation       Cardiovascular negative cardio ROS Normal cardiovascular exam Rhythm:Regular Rate:Normal     Neuro/Psych  Headaches, Seizures -, Well Controlled,  negative psych ROS   GI/Hepatic negative GI ROS, Neg liver ROS,   Endo/Other  negative endocrine ROS  Renal/GU negative Renal ROS  negative genitourinary   Musculoskeletal negative musculoskeletal ROS (+)   Abdominal   Peds  Hematology negative hematology ROS (+)   Anesthesia Other Findings   Reproductive/Obstetrics (+) Pregnancy                             Anesthesia Physical Anesthesia Plan  ASA: III  Anesthesia Plan: Epidural   Post-op Pain Management:    Induction:   PONV Risk Score and Plan: Treatment may vary due to age or medical condition  Airway Management Planned: Natural Airway  Additional Equipment:   Intra-op Plan:   Post-operative Plan:   Informed Consent: I have reviewed the patients History and Physical, chart, labs and discussed the procedure including the risks, benefits and alternatives for the proposed anesthesia with the patient or authorized representative who has indicated his/her understanding and acceptance.     Plan Discussed with: Anesthesiologist  Anesthesia Plan Comments: (Patient identified. Risks, benefits, options discussed with patient including but not limited to bleeding, infection, nerve damage, paralysis, failed block, incomplete pain control, headache, blood pressure changes, nausea, vomiting,  reactions to medication, itching, and post partum back pain. Confirmed with bedside nurse the patient's most recent platelet count. Confirmed with the patient that they are not taking any anticoagulation, have any bleeding history or any family history of bleeding disorders. Patient expressed understanding and wishes to proceed. All questions were answered. )        Anesthesia Quick Evaluation

## 2018-06-05 NOTE — Anesthesia Postprocedure Evaluation (Signed)
Anesthesia Post Note  Patient: Erin Price  Procedure(s) Performed: AN AD HOC LABOR EPIDURAL     Patient location during evaluation: Mother Baby Anesthesia Type: Epidural Level of consciousness: awake and alert Pain management: pain level controlled Vital Signs Assessment: post-procedure vital signs reviewed and stable Respiratory status: spontaneous breathing, nonlabored ventilation and respiratory function stable Cardiovascular status: stable Postop Assessment: no headache, no backache and epidural receding Anesthetic complications: no    Last Vitals:  Vitals:   06/05/18 1308 06/05/18 1736  BP: 119/73 103/67  Pulse: 72 64  Resp: 16 18  Temp: 36.9 C 37.3 C  SpO2:      Last Pain:  Vitals:   06/05/18 1736  TempSrc: Oral  PainSc:    Pain Goal:                 Junious SilkGILBERT,Nyxon Strupp

## 2018-06-05 NOTE — H&P (Signed)
OBSTETRIC ADMISSION HISTORY AND PHYSICAL  Erin Price is a 27 y.o. female [phone] with IUP at [redacted]w[redacted]d by U/S presenting for SOL. Felt significant pressure and fluid leakage around 1000 after admission, fluid on sheet was clear, slightly pink. She reports +FMs, no VB, no blurry vision, headaches or peripheral edema, and RUQ pain.  She plans on breast and bottle feeding. She request Nexplanon for birth control. She received her prenatal care at [facility]   Dating: By U/S --->  Estimated Date of Delivery: 06/21/18  Sono:   @[redacted]w[redacted]d , CWD, normal anatomy, cephalic presentation, 2598g, 45% EFW   Prenatal History/Complications: - Chlamydia positive (10/22/17); tx and neg in April - Hx of polysubstance abuse - Hx surgery for scoliosis - Hx of prior preterm delivery @35  weeks due to SOL - given 17P this pregnancy  Past Medical History: Past Medical History:  Diagnosis Date  . 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 Asthma   . Chlamydia   . Closed head injury   . Migraine   . Ovarian cyst   . Scoliosis   . Seizures (HCC)    last seizure May 2018  . Status epilepticus (HCC) 05/19/2015    Past Surgical History: Past Surgical History:  Procedure Laterality Date  . BACK SURGERY     screws placed for scoliosis    Obstetrical History: OB History    Gravida  3   Para  2   Term  1   Preterm  1   AB      Living  2     SAB      TAB      Ectopic      Multiple      Live Births  2        Obstetric Comments  G1: 37wks. 7lbs 4oz.         Social History: Social History   Socioeconomic History  . Marital status: Single    Spouse name: Not on file  . Number of children: Not on file  . Years of education: Not on file  . Highest education level: Not on file  Occupational History  . Not on file  Social Needs  . Financial resource strain: Not on file  . Food insecurity:    Worry: Not on file    Inability: Not on file  .  Transportation needs:    Medical: Not on file    Non-medical: Not on file  Tobacco Use  . Smoking status: Never Smoker  . Smokeless tobacco: Never Used  Substance and Sexual Activity  . Alcohol use: No  . Drug use: Not Currently    Types: Marijuana    Comment: last time was 10/22/2017  . Sexual activity: Yes    Birth control/protection: None  Lifestyle  . Physical activity:    Days per week: Not on file    Minutes per session: Not on file  . Stress: Not on file  Relationships  . Social connections:    Talks on phone: Not on file    Gets together: Not on file    Attends religious service: Not on file    Active member of club or organization: Not on file    Attends meetings of clubs or organizations: Not on file    Relationship status: Not on file  Other Topics Concern  . Not on file  Social History Narrative  . Not on file    Family History: Family  History  Problem Relation Age of Onset  . Hypertension Father   . Stroke Father   . Diabetes Maternal Grandmother   . Hypertension Maternal Grandmother   . Diabetes Paternal Grandmother   . Hypertension Paternal Grandmother   . Anesthesia problems Neg Hx     Allergies: No Known Allergies  Facility-Administered Medications Prior to Admission  Medication Dose Route Frequency Provider Last Rate Last Dose  . HYDROXYprogesterone Caproate SOAJ 275 mg  275 mg Subcutaneous Once Hermina Staggers, MD       Medications Prior to Admission  Medication Sig Dispense Refill Last Dose  . cyclobenzaprine (FLEXERIL) 10 MG tablet Take 1 tablet (10 mg total) by mouth 2 (two) times daily as needed for muscle spasms. (Patient not taking: Reported on 05/28/2018) 20 tablet 0 Not Taking  . Prenatal Multivit-Min-Fe-FA (PRENATAL VITAMINS) 0.8 MG tablet Take 1 tablet by mouth daily. 30 tablet 12 Taking  . progesterone (PROMETRIUM) 200 MG capsule Place 200 mg vaginally daily.   Not Taking     Review of Systems   All systems reviewed and negative  except as stated in HPI  Blood pressure (!) 101/59, pulse 89, temperature 98.5 F (36.9 C), temperature source Oral, resp. rate 18, height 5' (1.524 m), weight 67.9 kg, SpO2 100 %. General appearance: alert and cooperative Lungs: clear to auscultation bilaterally Heart: regular rate and rhythm Abdomen: soft, non-tender; bowel sounds normal Pelvic: see below  Extremities: Homans sign is negative, no sign of DVT DTR's 2+ Presentation: cephalic Fetal monitoringBaseline: 130 bpm, Variability: Good {> 6 bpm), Accelerations: Reactive and Decelerations: Absent Uterine activity:  Date/time of onset: at 0100 and Frequency: Every 2 minutes Dilation: 8 Effacement (%): 100 Station: 0 Exam by:: Earlene Plater, RN    Prenatal labs: ABO, Rh: O/Positive/-- (02/27 1459) Antibody: Negative (02/27 1459) Rubella: 1.32 (02/27 1459) RPR: Non Reactive (07/15 0919)  HBsAg: Negative (02/27 1459)  HIV: Non Reactive (07/15 0919)  GBS:   Negative 2 hr Glucola Normal Genetic screening  Normal Anatomy US: Normal  Prenatal Transfer Tool  Maternal Diabetes: No Genetic Screening: Normal Maternal Ultrasounds/Referrals: Abnormal:  Findings:   Other:short cervix during 3rd trimester, began 17P and nightly vaginal progesterone Fetal Ultrasounds or other Referrals:  Fetal echo normal Maternal Substance Abuse:  Yes:  Type: Marijuana (+ on 11/19/17, reports stopped using after finding out she was pregnant) Significant Maternal Medications:  None Significant Maternal Lab Results: None,  Results for orders placed or performed during the hospital encounter of 06/05/18 (from the past 24 hour(s))  CBC   Collection Time: 06/05/18  7:49 AM  Result Value Ref Range   WBC 14.8 (H) 4.0 - 10.5 K/uL   RBC 3.92 3.87 - 5.11 MIL/uL   Hemoglobin 12.1 12.0 - 15.0 g/dL   HCT 16.1 (L) 09.6 - 04.5 %   MCV 90.8 78.0 - 100.0 fL   MCH 30.9 26.0 - 34.0 pg   MCHC 34.0 30.0 - 36.0 g/dL   RDW 40.9 81.1 - 91.4 %   Platelets 225 150 - 400  K/uL    Patient Active Problem List   Diagnosis Date Noted  . History of preterm delivery, currently pregnant 11/19/2017  . History of substance abuse 11/19/2017  . Supervision of other high risk pregnancy, antepartum 11/19/2017  . Scoliosis   . Migraine   . Asthma, mild intermittent   . Seizures (HCC)     Assessment/Plan:  Erin Price is a 27 y.o. G3P1102 at [redacted]w[redacted]d here for SOL.  #Labor:Progressing  well without augmentation. @1015  at 8cm/100/0 with SROM from 5-6cm this AM in MAU (0600). #Pain: Epidural in place. #FWB: Cat I - Pt has hx of marijuana use, UDS #ID: GBS -, rubella immune #MOF: Plan to breast and bottle feed. #MOC: Nexplanon  Myrtie HawkAriel E Hwang, Medical Student    Attestation of Attending Supervision of Medical Student: Evaluation and management procedures were performed by the Medical Student under my supervision. I confirm that I have verified the information documented in the medical student's note, and that I have also personally reperformed the physical exam and all medical decision making activities. I agree with management and plan as outlined in the documentation.    Jaynie CollinsUGONNA  Grove Defina, MD, FACOG Attending Obstetrician & Gynecologist Faculty Practice, [facility] [place]

## 2018-06-05 NOTE — MAU Note (Signed)
Was up all night contracting. Now every 2 min. No bleeding or leaking. Was 3cm for the past month.

## 2018-06-05 NOTE — Progress Notes (Addendum)
Informed of some increased bleeding postpartum, total EBL about 500 ml. Will give Methergine IM x 1 dose now and continue to monitor closely.    Jaynie CollinsUGONNA  Sharlotte Baka, MD, FACOG Obstetrician & Gynecologist, [facility], [facility]

## 2018-06-06 MED ORDER — IBUPROFEN 600 MG PO TABS: 600.0000 mg | ORAL_TABLET | Freq: Four times a day (QID) | ORAL | 0 refills | Status: DC

## 2018-06-06 MED ORDER — ACETAMINOPHEN 325 MG PO TABS: 650.0000 mg | ORAL_TABLET | ORAL | 0 refills | Status: DC | PRN

## 2018-06-06 MED ORDER — MEASLES, MUMPS & RUBELLA VAC ~~LOC~~ INJ: 0.5000 mL | INJECTION | Freq: Once | SUBCUTANEOUS | 0 refills | Status: AC

## 2018-06-06 NOTE — Discharge Summary (Addendum)
Obstetrics Discharge Summary OB/GYN Faculty Practice   Patient Name: Erin Price DOB: June 27, 1991 MRN: [id number]  Date of admission: 06/05/2018 Delivering MD: Verita Schneiders A   Date of discharge: 06/06/2018  Admitting diagnosis: 38wks pelivc pain ctx 2-3 mins Intrauterine pregnancy: [redacted]w[redacted]d    Secondary diagnosis:   Principal Problem:   Spontaneous vaginal delivery Active Problems:   Scoliosis   Migraine   Asthma, mild intermittent   History of preterm delivery, currently pregnant   History of substance abuse   Additional problems:  . none     Discharge diagnosis: Term Pregnancy Delivered                                            Postpartum procedures: None  Complications: none  Hospital course: Erin Price a 27y.o. 346w5dho was admitted for SOL. Her pregnancy, labor, delivery were uncomplicated. Please see delivery/op note for additional details. Her postpartum course was uncomplicated. She was breastfeeding without difficulty. By day of discharge, she was passing flatus, urinating, eating and drinking without difficulty. Her pain was well-controlled, and she was discharged home with tylenol/motrin. She will follow-up in clinic in 4 weeks.   Physical exam  Vitals:   06/05/18 1736 06/05/18 2011 06/05/18 2323 06/06/18 0542  BP: 103/67 109/67 101/71 (!) 97/58  Pulse: 64 68 63 60  Resp: _0 Temp: 99.2 F (37.3 C) 98.1 F (36.7 C) 97.9 F (36.6 C) 97.8 F (36.6 C)  TempSrc: Oral Oral Oral Oral  SpO2:      Weight:      Height:       General: AAO, NAD Lochia: appropriate Uterine Fundus: firm Incision: N/A DVT Evaluation: No evidence of DVT seen on physical exam. Labs: Lab Results  Component Value Date   WBC 14.8 (H) 06/05/2018   HGB 12.1 06/05/2018   HCT 35.6 (L) 06/05/2018   MCV 90.8 06/05/2018   PLT 225 06/05/2018   CMP Latest Ref Rng & Units 04/13/2017  Glucose 65 - 99 mg/dL 93  BUN 6 - 20 mg/dL 12  Creatinine 0.44 - 1.00 mg/dL 0.70   Sodium 135 - 145 mmol/L 137  Potassium 3.5 - 5.1 mmol/L 3.5  Chloride 101 - 111 mmol/L 103  CO2 22 - 32 mmol/L 25  Calcium 8.9 - 10.3 mg/dL 9.3  Total Protein 6.5 - 8.1 g/dL -  Total Bilirubin 0.3 - 1.2 mg/dL -  Alkaline Phos 38 - 126 U/L -  AST 15 - 41 U/L -  ALT 14 - 54 U/L -    Discharge instructions: Per After Visit Summary and "Baby and Me Booklet"  After visit meds:  Allergies as of 06/06/2018   No Known Allergies     Medication List    TAKE these medications   acetaminophen 325 MG tablet Commonly known as:  TYLENOL Take 2 tablets (650 mg total) by mouth every 4 (four) hours as needed (for pain scale < 4).   ibuprofen 600 MG tablet Commonly known as:  ADVIL,MOTRIN Take 1 tablet (600 mg total) by mouth every 6 (six) hours.   measles, mumps and rubella vaccine injection Commonly known as:  MMR Inject 0.5 mLs into the skin once for 1 dose.   Prenatal Vitamins 0.8 MG tablet Take 1 tablet by mouth daily.       Postpartum contraception: Nexplanon Diet: Routine Diet  Activity: Advance as tolerated. Pelvic rest for 6 weeks.   Outpatient follow up:4wks Follow-up Appt: Future Appointments  Date Time Provider [place]  07/09/2018  8:55 AM Rasch, Artist Pais, NP [facility]   Follow-up Visit:No follow-ups on file.  Newborn Data: Live born female  Birth Weight: 7 lb 15.6 oz (3617 g) APGAR: 54, 9  Newborn Delivery   Birth date/time:  06/05/2018 10:55:00 Delivery type:  Vaginal, Spontaneous     Baby Feeding: Breast Disposition:home with mother   CNM attestation I have seen and examined this patient and agree with above documentation in the resident's note.   Erin Price is a 27 y.o. [phone] s/p SVD.   Pain is well controlled.  Plan for birth control is Nexplanon.  Method of Feeding: breast  PE:  BP (!) 97/58   Pulse 60   Temp 97.8 F (36.6 C) (Oral)   Resp 18   Ht 5' (1.524 m)   Wt 67.9 kg   LMP  (LMP Unknown)   SpO2 100%    Breastfeeding? Unknown   BMI 29.25 kg/m  Fundus firm  Recent Labs    06/05/18 0749  HGB 12.1  HCT 35.6*     Plan: discharge today - postpartum care discussed - f/u clinic in 4 weeks for postpartum visit   Erin Price, CNM 8:59 AM  06/06/2018

## 2018-06-06 NOTE — Lactation Note (Signed)
This note was copied from a baby's chart. Lactation Consultation Note  Patient Name: Erin Price [id number]'s Date: 06/06/2018 Reason for consult: Initial assessment;Early term 37-38.6wks;Infant weight loss  P3 mother whose infant is now 8625 hours old.  This is an ETI at 37+5 weeks who has a weight loss of 7%.  Mother was hoping to be an early discharge but baby is remaining as a baby patient until tomorrow.  Mother holding baby as I entered the room.  Mother stated that baby cannot go home today due to weight loss and jaundice.  Encouraged her to continue doing a lot of STS and to feed 8-12 times/24 hours or sooner if baby shows cues.  Mother stated she is feeding actively and she does hear swallows.  Encouraged her to call me for latch assistance as needed and to be sure RN/LC does periodically observe latching.  Mother verbalized understanding.  Mom made aware of O/P services, breastfeeding support groups, community resources, and our phone # for post-discharge questions. Mother will call for assistance as needed.  RN in room and aware of situation.   Maternal Data Formula Feeding for Exclusion: No Has patient been taught Hand Expression?: Yes  Feeding Feeding Type: Breast Fed Length of feed: 27 min  LATCH Score                   Interventions    Lactation Tools Discussed/Used     Consult Status Consult Status: Follow-up Date: 06/07/18 Follow-up type: In-patient    Erin Price 06/06/2018, 12:46 PM

## 2018-06-06 NOTE — Progress Notes (Signed)
Called Faculty Practice on call requested discontinuation of discharge per patient request.

## 2018-06-06 NOTE — Discharge Instructions (Signed)
Vaginal Delivery, Care After °Refer to this sheet in the next few weeks. These instructions provide you with information about caring for yourself after vaginal delivery. Your health care provider may also give you more specific instructions. Your treatment has been planned according to current medical practices, but problems sometimes occur. Call your health care provider if you have any problems or questions. °What can I expect after the procedure? °After vaginal delivery, it is common to have: °· Some bleeding from your vagina. °· Soreness in your abdomen, your vagina, and the area of skin between your vaginal opening and your anus (perineum). °· Pelvic cramps. °· Fatigue. ° °Follow these instructions at home: °Medicines °· Take over-the-counter and prescription medicines only as told by your health care provider. °· If you were prescribed an antibiotic medicine, take it as told by your health care provider. Do not stop taking the antibiotic until it is finished. °Driving ° °· Do not drive or operate heavy machinery while taking prescription pain medicine. °· Do not drive for 24 hours if you received a sedative. °Lifestyle °· Do not drink alcohol. This is especially important if you are breastfeeding or taking medicine to relieve pain. °· Do not use tobacco products, including cigarettes, chewing tobacco, or e-cigarettes. If you need help quitting, ask your health care provider. °Eating and drinking °· Drink at least 8 eight-ounce glasses of water every day unless you are told not to by your health care provider. If you choose to breastfeed your baby, you may need to drink more water than this. °· Eat high-fiber foods every day. These foods may help prevent or relieve constipation. High-fiber foods include: °? Whole grain cereals and breads. °? Brown rice. °? Beans. °? Fresh fruits and vegetables. °Activity °· Return to your normal activities as told by your health care provider. Ask your health care provider  what activities are safe for you. °· Rest as much as possible. Try to rest or take a nap when your baby is sleeping. °· Do not lift anything that is heavier than your baby or 10 lb (4.5 kg) until your health care provider says that it is safe. °· Talk with your health care provider about when you can engage in sexual activity. This may depend on your: °? Risk of infection. °? Rate of healing. °? Comfort and desire to engage in sexual activity. °Vaginal Care °· If you have an episiotomy or a vaginal tear, check the area every day for signs of infection. Check for: °? More redness, swelling, or pain. °? More fluid or blood. °? Warmth. °? Pus or a bad smell. °· Do not use tampons or douches until your health care provider says this is safe. °· Watch for any blood clots that may pass from your vagina. These may look like clumps of dark red, brown, or black discharge. °General instructions °· Keep your perineum clean and dry as told by your health care provider. °· Wear loose, comfortable clothing. °· Wipe from front to back when you use the toilet. °· Ask your health care provider if you can shower or take a bath. If you had an episiotomy or a perineal tear during labor and delivery, your health care provider may tell you not to take baths for a certain length of time. °· Wear a bra that supports your breasts and fits you well. °· If possible, have someone help you with household activities and help care for your baby for at least a few days after   you leave the hospital. °· Keep all follow-up visits for you and your baby as told by your health care provider. This is important. °Contact a health care provider if: °· You have: °? Vaginal discharge that has a bad smell. °? Difficulty urinating. °? Pain when urinating. °? A sudden increase or decrease in the frequency of your bowel movements. °? More redness, swelling, or pain around your episiotomy or vaginal tear. °? More fluid or blood coming from your episiotomy or  vaginal tear. °? Pus or a bad smell coming from your episiotomy or vaginal tear. °? A fever. °? A rash. °? Little or no interest in activities you used to enjoy. °? Questions about caring for yourself or your baby. °· Your episiotomy or vaginal tear feels warm to the touch. °· Your episiotomy or vaginal tear is separating or does not appear to be healing. °· Your breasts are painful, hard, or turn red. °· You feel unusually sad or worried. °· You feel nauseous or you vomit. °· You pass large blood clots from your vagina. If you pass a blood clot from your vagina, save it to show to your health care provider. Do not flush blood clots down the toilet without having your health care provider look at them. °· You urinate more than usual. °· You are dizzy or light-headed. °· You have not breastfed at all and you have not had a menstrual period for 12 weeks after delivery. °· You have stopped breastfeeding and you have not had a menstrual period for 12 weeks after you stopped breastfeeding. °Get help right away if: °· You have: °? Pain that does not go away or does not get better with medicine. °? Chest pain. °? Difficulty breathing. °? Blurred vision or spots in your vision. °? Thoughts about hurting yourself or your baby. °· You develop pain in your abdomen or in one of your legs. °· You develop a severe headache. °· You faint. °· You bleed from your vagina so much that you fill two sanitary pads in one hour. °This information is not intended to replace advice given to you by your health care provider. Make sure you discuss any questions you have with your health care provider. °Document Released: 09/06/2000 Document Revised: 02/21/2016 Document Reviewed: 09/24/2015 °Elsevier Interactive Patient Education © 2018 Elsevier Inc. ° °

## 2018-06-07 NOTE — Discharge Summary (Addendum)
Obstetrics Discharge Summary OB/GYN Faculty Practice   Patient Name: Erin Price DOB: 11/12/1990 MRN: [id number]  Date of admission: 06/05/2018 Delivering MD: ANYANWU, UGONNA A   Date of discharge: 06/07/2018  Admitting diagnosis: 38wks pelivc pain ctx 2-3 mins Intrauterine pregnancy: [redacted]w[redacted]d     Secondary diagnosis:   Principal Problem:   Spontaneous vaginal delivery Active Problems:   Scoliosis   Migraine   Asthma, mild intermittent   History of preterm delivery, currently pregnant   History of substance abuse   Additional problems:  . none     Discharge diagnosis: Term Pregnancy Delivered                                            Postpartum procedures: None  Complications: none  Hospital course: Erin Price is a 27 y.o. [redacted]w[redacted]d who was admitted for SOL. Her pregnancy, labor, delivery were uncomplicated. Please see delivery/op note for additional details. Her postpartum course was uncomplicated. She was breastfeeding without difficulty. By day of discharge, she was passing flatus, urinating, eating and drinking without difficulty. Her pain was well-controlled, and she was discharged home with tylenol/motrin. She will follow-up in clinic in 4 weeks.   Physical exam  Vitals:   06/06/18 0542 06/06/18 1446 06/06/18 2158 06/07/18 0634  BP: (!) 97/58 104/67 106/67 108/69  Pulse: 60 73 74 69  Resp: 18 18 14   Temp: 97.8 F (36.6 C) 98.4 F (36.9 C) 97.6 F (36.4 C) 98.1 F (36.7 C)  TempSrc: Oral Oral  Oral  SpO2:  99% 99%   Weight:      Height:       General: AAO, NAD Lochia: appropriate Uterine Fundus: firm Incision: N/A DVT Evaluation: No evidence of DVT seen on physical exam. Labs: Lab Results  Component Value Date   WBC 14.8 (H) 06/05/2018   HGB 12.1 06/05/2018   HCT 35.6 (L) 06/05/2018   MCV 90.8 06/05/2018   PLT 225 06/05/2018   CMP Latest Ref Rng & Units 04/13/2017  Glucose 65 - 99 mg/dL 93  BUN 6 - 20 mg/dL 12  Creatinine 0.44 - 1.00 mg/dL 0.70   Sodium 135 - 145 mmol/L 137  Potassium 3.5 - 5.1 mmol/L 3.5  Chloride 101 - 111 mmol/L 103  CO2 22 - 32 mmol/L 25  Calcium 8.9 - 10.3 mg/dL 9.3  Total Protein 6.5 - 8.1 g/dL -  Total Bilirubin 0.3 - 1.2 mg/dL -  Alkaline Phos 38 - 126 U/L -  AST 15 - 41 U/L -  ALT 14 - 54 U/L -    Discharge instructions: Per After Visit Summary and "Baby and Me Booklet"  After visit meds:  Allergies as of 06/07/2018   No Known Allergies     Medication List    TAKE these medications   acetaminophen 325 MG tablet Commonly known as:  TYLENOL Take 2 tablets (650 mg total) by mouth every 4 (four) hours as needed (for pain scale < 4).   ibuprofen 600 MG tablet Commonly known as:  ADVIL,MOTRIN Take 1 tablet (600 mg total) by mouth every 6 (six) hours.   Prenatal Vitamins 0.8 MG tablet Take 1 tablet by mouth daily.     ASK your doctor about these medications   measles, mumps and rubella vaccine injection Commonly known as:  MMR Inject 0.5 mLs into the skin once for 1 dose. Ask about:   Should I take this medication?       Postpartum contraception: Nexplanon Diet: Routine Diet Activity: Advance as tolerated. Pelvic rest for 6 weeks.   Outpatient follow up:4wks Follow-up Appt: Future Appointments  Date Time Provider [place]  07/09/2018  8:55 AM Rasch, Artist Pais, NP [facility]   Follow-up Visit:No follow-ups on file.  Newborn Data: Live born female  Birth Weight: 7 lb 15.6 oz (3617 g) APGAR: 83, 9  Newborn Delivery   Birth date/time:  06/05/2018 10:55:00 Delivery type:  Vaginal, Spontaneous     Baby Feeding: Breast Disposition:home with mother   Plan: discharge today - postpartum care discussed - f/u clinic in 6 weeks for postpartum visit   Demetrius Revel, MD 7:11 AM  06/07/2018   Midwife attestation I have seen and examined this patient and agree with above documentation in the resident's note.   Erin Price is a 27 y.o. [phone] s/p SVD.  Pain is  well controlled. Plan for birth control is Nexplanon. Method of Feeding: breast and formula  PE:  Gen: well appearing Heart: reg rate Lungs: normal WOB Fundus firm Ext: no pain, no edema  Recent Labs    06/05/18 0749  HGB 12.1  HCT 35.6*     Assessment S/p SVD PPD # 2  Plan: - discharge home - postpartum care discussed - f/u clinic in 6 weeks for postpartum visit   Julianne Handler, CNM 9:31 AM

## 2018-06-07 NOTE — Clinical Social Work Maternal (Signed)
CLINICAL SOCIAL WORK MATERNAL/CHILD NOTE  Patient Details  Name: Erin Price MRN: [id number] Date of Birth: April 25, 1991  Date:  06/07/2018  Clinical Social Worker Initiating Note:  Madilyn Fireman, MSW, LCSW-A Date/Time: Initiated:  06/07/18/1040     Child's Name:  Erin Price   Biological Parents:  Mother   Need for Interpreter:  None   Reason for Referral:  Current Substance Use/Substance Use During Pregnancy    Address:  [place]  [address]    Phone number:  [phone] (home)     Additional phone number:   Household Members/Support Persons (HM/SP):   Household Member/Support Person 1   HM/SP Name Relationship DOB or Age  HM/SP -1 [place], FOB    HM/SP -2        HM/SP -3        HM/SP -4        HM/SP -5        HM/SP -6        HM/SP -7        HM/SP -8          Natural Supports (not living in the home):  Extended Family, Friends, Immediate Family   Professional Supports: Case Manager/Social Worker(Caity, OBCM Education officer, museum)   Employment: Unemployed   Type of Work:     Education:  Programmer, systems   Homebound arranged:    Museum/gallery curator Resources:  [employer]   Other Resources:  [employer], Physicist, medical    Cultural/Religious Considerations Which May Impact Care:  None  Strengths:  Ability to meet basic needs , Engineer, materials, Home prepared for child    Psychotropic Medications:    None     Pediatrician:    [place]  Pediatrician List:   [facility] ([address])  [place]      Pediatrician Fax Number:    Risk Factors/Current Problems:  None   Cognitive State:  Alert , Able to Concentrate    Mood/Affect:  Comfortable , Calm , Interested , Happy    CSW Assessment: CSW received consult for MOB due to her history of marijuana use. CSW met with MOB, FOB Moe, and baby  Amelia at bedside to complete assessment. CSW obtained permission from MOB to complete assessment with FOB present. CSW inquired with MOB regarding her THC use, MOB reports smoking prior to pregnancy and that she attempted to quit after she was six weeks. MOB denies any current or recent use. MOB denies any substance use other than marijuana. CSW educated parents on hospital drug screening policies, no questions or concerns. 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 MOB has a Pregnancy Care Manager named Caity through the [facility]. MOB reports great family and friend support. MOB states her residential address is [address], [address], [place], [place]. MOB reports she has a new car seat for safe transportation with knowledge of installation and use. MOB reports having a crib and bassinet at home for safe sleeping. SIDS precautions thoroughly reviewed. MOB reports Clyde Canterbury will receive pediatric care at [facility]. MOB denies concerns at this time, CSW encouraged her to reach out for assistance if needs arise.  CSW will continue monitoring chart for UDS and cord results and will make report if warranted.  CSW Plan/Description:  CSW Will Continue to Monitor Umbilical Cord Tissue Drug Screen Results and Make Report if Warranted, Ray, LCSWA 06/07/2018, 10:42 AM

## 2018-06-10 ENCOUNTER — Encounter: Payer: Medicaid Other | Admitting: Family Medicine

## 2018-06-17 ENCOUNTER — Encounter: Payer: Medicaid Other | Admitting: Family Medicine

## 2018-06-26 ENCOUNTER — Encounter: Payer: Medicaid Other | Admitting: Student

## 2018-07-09 ENCOUNTER — Ambulatory Visit (INDEPENDENT_AMBULATORY_CARE_PROVIDER_SITE_OTHER): Payer: Medicaid Other | Admitting: Obstetrics and Gynecology

## 2018-07-09 VITALS — BP 108/65 | HR 57 | Wt 127.0 lb

## 2018-07-09 DIAGNOSIS — Z1389 Encounter for screening for other disorder: Secondary | ICD-10-CM | POA: Diagnosis not present

## 2018-07-09 DIAGNOSIS — Z30017 Encounter for initial prescription of implantable subdermal contraceptive: Secondary | ICD-10-CM

## 2018-07-09 DIAGNOSIS — Z3040 Encounter for surveillance of contraceptives, unspecified: Secondary | ICD-10-CM

## 2018-07-09 LAB — POCT PREGNANCY, URINE: Preg Test, Ur: NEGATIVE

## 2018-07-09 MED ORDER — ETONOGESTREL 68 MG ~~LOC~~ IMPL
68.0000 mg | DRUG_IMPLANT | Freq: Once | SUBCUTANEOUS | Status: AC
  Administered 2018-07-09: 68 mg via SUBCUTANEOUS

## 2018-07-09 NOTE — Progress Notes (Signed)
Subjective:     Erin Price is a 27 y.o. female who presents for a postpartum visit. She is 5 weeks postpartum following a spontaneous vaginal delivery. I have fully reviewed the prenatal and intrapartum course. The delivery was at 37.5 gestational weeks. Outcome: spontaneous vaginal delivery. Anesthesia: epidural. Postpartum course has been unremarkable. Baby's course has been unremarkable. Baby is feeding by breast. Bleeding no bleeding. Bowel function is normal. Bladder function is normal. Patient is not sexually active. Contraception method is Nexplanon. Postpartum depression screening: negative.  The following portions of the patient's history were reviewed and updated as appropriate: allergies, current medications, past family history, past medical history, past social history, past surgical history and problem list.  Review of Systems Pertinent items are noted in HPI.   Objective:    BP 108/65   Pulse (!) 57   Wt 127 lb (57.6 kg)   LMP  (LMP Unknown)   BMI 24.80 kg/m   General:  alert, cooperative and appears stated age  Lungs: clear to auscultation bilaterally  Heart:  regular rate and rhythm, S1, S2 normal, no murmur, click, rub or gallop  Abdomen: soft, non-tender; bowel sounds normal; no masses,  no organomegaly   Vulva:  not evaluated  Vagina: not evaluated  Cervix:  Not evaluated         Assessment:   Normal postpartum exam. Pap smear not done at today's visit. Last pap was reported by patient on 07/2017 and it was normal   Plan:   1. Contraception: Nexplanon  2. Doing well, not sexually active  3. Follow up in: as needed.  Duane Lope, NP 07/09/2018 9:48 AM        GYNECOLOGY OFFICE PROCEDURE NOTE  Erin Price is a 27 y.o. [phone] here for Nexplanon insertion.  Last pap smear was on 2018 and was normal.  No other gynecologic concerns.  Nexplanon Insertion Procedure Patient identified, informed consent performed, consent signed.   Patient  does understand that irregular bleeding is a very common side effect of this medication. She was advised to have backup contraception for one week after placement. Pregnancy test in clinic today was negative.  Appropriate time out taken.  Patient's left arm was prepped and draped in the usual sterile fashion. The ruler used to measure and mark insertion area.  Patient was prepped with alcohol swab and then injected with 3 ml of 1% lidocaine.  She was prepped with betadine, Nexplanon removed from packaging,  Device confirmed in needle, then inserted full length of needle and withdrawn per handbook instructions. Nexplanon was able to palpated in the patient's arm; patient palpated the insert herself. There was minimal blood loss.  Patient insertion site covered with guaze and a pressure bandage to reduce any bruising.  The patient tolerated the procedure well and was given post procedure instructions.    Rasch, Harolyn Rutherford, NP [facility] for [employer], [facility]

## 2018-07-14 ENCOUNTER — Encounter: Payer: Self-pay | Admitting: *Deleted

## 2019-05-04 ENCOUNTER — Other Ambulatory Visit: Payer: Self-pay

## 2019-05-04 ENCOUNTER — Encounter: Payer: Self-pay | Admitting: Advanced Practice Midwife

## 2019-05-04 ENCOUNTER — Ambulatory Visit (INDEPENDENT_AMBULATORY_CARE_PROVIDER_SITE_OTHER): Payer: Medicaid Other | Admitting: Advanced Practice Midwife

## 2019-05-04 ENCOUNTER — Other Ambulatory Visit (HOSPITAL_COMMUNITY)
Admission: RE | Admit: 2019-05-04 | Discharge: 2019-05-04 | Disposition: A | Discharge status: Home / Self Care | Payer: Medicaid Other | Source: Ambulatory Visit | Attending: Advanced Practice Midwife | Admitting: Advanced Practice Midwife

## 2019-05-04 VITALS — BP 110/71 | HR 69 | Wt 104.0 lb

## 2019-05-04 DIAGNOSIS — N938 Other specified abnormal uterine and vaginal bleeding: Secondary | ICD-10-CM | POA: Diagnosis not present

## 2019-05-04 DIAGNOSIS — Z202 Contact with and (suspected) exposure to infections with a predominantly sexual mode of transmission: Secondary | ICD-10-CM | POA: Diagnosis not present

## 2019-05-04 DIAGNOSIS — Z3046 Encounter for surveillance of implantable subdermal contraceptive: Secondary | ICD-10-CM

## 2019-05-04 DIAGNOSIS — B3731 Acute candidiasis of vulva and vagina: Secondary | ICD-10-CM

## 2019-05-04 DIAGNOSIS — N898 Other specified noninflammatory disorders of vagina: Secondary | ICD-10-CM | POA: Insufficient documentation

## 2019-05-04 DIAGNOSIS — Z114 Encounter for screening for human immunodeficiency virus [HIV]: Secondary | ICD-10-CM | POA: Diagnosis not present

## 2019-05-04 DIAGNOSIS — B373 Candidiasis of vulva and vagina: Secondary | ICD-10-CM

## 2019-05-04 HISTORY — DX: Other specified abnormal uterine and vaginal bleeding: N93.8

## 2019-05-04 MED ORDER — IBUPROFEN 600 MG PO TABS: 600.0000 mg | ORAL_TABLET | Freq: Four times a day (QID) | ORAL | 0 refills | Status: DC

## 2019-05-04 NOTE — Progress Notes (Signed)
   Subjective:    Patient ID: Erin Price, female    DOB: 06-21-1991, 28 y.o.   MRN: [id number]  [name] is a 28 year old female who is presenting for Nexplanon removal and for STD testing.  She states that she and her partner have split up, and she no longer needs the Nexplanon. She is complaining of irregular bleeding and heavier periods with the Nexplanon. She is also concerned about STDs, as she recently found out her previous partner had been cheating on her with multiple people. She denies vaginal discharge, burning with urination, or vaginal pain.     Review of Systems  All other systems reviewed and are negative.      Objective:   Physical Exam Constitutional:      Appearance: Normal appearance. She is normal weight.  HENT:     Head: Normocephalic and atraumatic.     Nose: Nose normal.     Mouth/Throat:     Mouth: Mucous membranes are moist.  Neck:     Musculoskeletal: Normal range of motion.  Pulmonary:     Effort: Pulmonary effort is normal.  Abdominal:     General: Abdomen is flat.     Palpations: Abdomen is soft.  Genitourinary:    General: Normal vulva.  Musculoskeletal:        General: No swelling or deformity.  Skin:    General: Skin is warm and dry.  Neurological:     Mental Status: She is alert.           Assessment & Plan:  28 yo G1P1 presenting for STD testing and Nexplanon removal.  1. Encounter for Nexplanon removal Nexplanon removed, as per procedure note below - ibuprofen (ADVIL) 600 MG tablet; Take 1 tablet (600 mg total) by mouth every 6 (six) hours.  Dispense: 30 tablet; Refill: 0  2. Possible exposure to STD - Cervicovaginal ancillary only( [place]) - RPR - Hepatitis B surface antigen - Hepatitis C Antibody - HIV Antibody (routine testing w rflx) - ibuprofen (ADVIL) 600 MG tablet; Take 1 tablet (600 mg total) by mouth every 6 (six) hours.  Dispense: 30 tablet; Refill: 0  3. Encounter for screening for HIV - HIV Antibody  (routine testing w rflx)  4. Uterine bleeding, dysfunctional Nexplanon removed, as per procedure note below    Nexplanon Removal:  Patient given informed consent for removal of her Implanon, time out was performed.  Signed copy in the chart.  Appropriate time out taken. Implanon site identified.  Area prepped in usual sterile fashon. 2 cc of 1% lidocaine was used to anesthetize the area at the distal end of the implant. A small stab incision was made right beside the implant on the distal portion.  The implanon rod was grasped using hemostats and removed without difficulty.  There was less than 3 cc blood loss. There were no complications.  Steri-strips were applied over the small incision.  A pressure bandage was applied to reduce any bruising.   The patient tolerated the procedure well and was given post procedure instructions.

## 2019-05-04 NOTE — Patient Instructions (Signed)
NEXPLANON REMOVAL POST-PROCEDURE INSTRUCTIONS  1. Keep the pressure dressing on for 24 hours. If it is too tight you may loosen it. Keep a band-aid on the site for five to seven days.  2. You may take Ibuprofen, Aleve or Tylenol for pain if needed.    3. You may have a small amount of bleeding from the site.  4. If you are not planning to get pregnant you should start other birth control immediately to avoid unplanned pregnancy.  5. Call the office if you have fever greater that 100.4, significant bleeding, redness, swelling or pus draining from the incision.   6. Keep area dry for 24 hours.

## 2019-05-05 LAB — HIV ANTIBODY (ROUTINE TESTING W REFLEX): HIV Screen 4th Generation wRfx: NONREACTIVE

## 2019-05-05 LAB — HEPATITIS B SURFACE ANTIGEN: Hepatitis B Surface Ag: NEGATIVE

## 2019-05-05 LAB — RPR: RPR Ser Ql: NONREACTIVE

## 2019-05-05 LAB — HEPATITIS C ANTIBODY: Hep C Virus Ab: <0.1 s/co ratio (ref 0.0–0.9)

## 2019-05-06 LAB — CERVICOVAGINAL ANCILLARY ONLY
Bacterial vaginitis: NEGATIVE
Candida vaginitis: POSITIVE — AB
Chlamydia: NEGATIVE
Neisseria Gonorrhea: NEGATIVE
Trichomonas: NEGATIVE

## 2019-05-09 MED ORDER — FLUCONAZOLE 150 MG PO TABS: 150.0000 mg | ORAL_TABLET | Freq: Once | ORAL | 1 refills | Status: AC

## 2019-05-09 NOTE — Addendum Note (Signed)
Addended by: Manya Silvas on: 05/09/2019 09:20 PM   Modules accepted: Orders

## 2019-05-13 ENCOUNTER — Encounter: Payer: Self-pay | Admitting: General Practice

## 2019-10-26 ENCOUNTER — Encounter: Payer: Self-pay | Admitting: Family Medicine

## 2019-10-26 ENCOUNTER — Other Ambulatory Visit: Payer: Self-pay

## 2019-10-26 ENCOUNTER — Ambulatory Visit (INDEPENDENT_AMBULATORY_CARE_PROVIDER_SITE_OTHER): Payer: Medicaid Other | Admitting: General Practice

## 2019-10-26 VITALS — BP 111/64 | HR 68 | Ht 60.0 in | Wt 117.0 lb

## 2019-10-26 DIAGNOSIS — Z23 Encounter for immunization: Secondary | ICD-10-CM

## 2019-10-26 DIAGNOSIS — Z111 Encounter for screening for respiratory tuberculosis: Secondary | ICD-10-CM

## 2019-10-26 NOTE — Patient Instructions (Signed)

## 2019-10-26 NOTE — Progress Notes (Signed)
Patient presents to office today for TB skin test and Flu vaccine. Flu vaccine administered without complication. TB skin test given in right forearm. Patient will return in 48-72 hours for PPD reading.  Chase Caller RN BSN 10/26/19

## 2019-10-28 ENCOUNTER — Ambulatory Visit (INDEPENDENT_AMBULATORY_CARE_PROVIDER_SITE_OTHER): Payer: Medicaid Other

## 2019-10-28 ENCOUNTER — Other Ambulatory Visit: Payer: Self-pay

## 2019-10-28 DIAGNOSIS — Z111 Encounter for screening for respiratory tuberculosis: Secondary | ICD-10-CM

## 2019-10-28 NOTE — Progress Notes (Signed)
Pt here today for PPD reading on right forearm.  Observed pt with zero induration. Informed pt that she has negative PPD screening.  Pt given letter for negative results.  Pt did not have another questions or concerns.    Addison Naegeli, RN 10/28/19

## 2019-10-28 NOTE — Progress Notes (Signed)
I have reviewed this chart and agree with the RN/CMA assessment and management.    K. Meryl Ernestina Joe, M.D. [facility] ([facility]ulty [facility])   

## 2019-11-21 NOTE — Progress Notes (Signed)
Chart reviewed for nurse visit. Agree with plan of care.   Ludy Messamore Lorraine, CNM 11/21/2019 7:45 AM   

## 2020-01-06 ENCOUNTER — Encounter: Payer: Self-pay | Admitting: *Deleted

## 2020-02-07 ENCOUNTER — Encounter (HOSPITAL_COMMUNITY): Payer: Self-pay

## 2020-02-07 ENCOUNTER — Inpatient Hospital Stay (HOSPITAL_COMMUNITY)
Admission: AD | Admit: 2020-02-07 | Discharge: 2020-02-07 | Disposition: A | Discharge status: Home / Self Care | Payer: Medicaid Other | Attending: Obstetrics and Gynecology | Admitting: Obstetrics and Gynecology

## 2020-02-07 ENCOUNTER — Other Ambulatory Visit: Payer: Self-pay

## 2020-02-07 ENCOUNTER — Inpatient Hospital Stay (HOSPITAL_COMMUNITY): Payer: Medicaid Other

## 2020-02-07 DIAGNOSIS — O26891 Other specified pregnancy related conditions, first trimester: Secondary | ICD-10-CM | POA: Diagnosis not present

## 2020-02-07 DIAGNOSIS — Z3A01 Less than 8 weeks gestation of pregnancy: Secondary | ICD-10-CM | POA: Insufficient documentation

## 2020-02-07 DIAGNOSIS — Z8249 Family history of ischemic heart disease and other diseases of the circulatory system: Secondary | ICD-10-CM | POA: Diagnosis not present

## 2020-02-07 DIAGNOSIS — O26899 Other specified pregnancy related conditions, unspecified trimester: Secondary | ICD-10-CM | POA: Diagnosis not present

## 2020-02-07 DIAGNOSIS — Z833 Family history of diabetes mellitus: Secondary | ICD-10-CM | POA: Insufficient documentation

## 2020-02-07 DIAGNOSIS — R109 Unspecified abdominal pain: Secondary | ICD-10-CM

## 2020-02-07 DIAGNOSIS — R1031 Right lower quadrant pain: Secondary | ICD-10-CM | POA: Insufficient documentation

## 2020-02-07 DIAGNOSIS — O3680X0 Pregnancy with inconclusive fetal viability, not applicable or unspecified: Secondary | ICD-10-CM | POA: Insufficient documentation

## 2020-02-07 DIAGNOSIS — Z823 Family history of stroke: Secondary | ICD-10-CM | POA: Diagnosis not present

## 2020-02-07 LAB — CBC
HCT: 36.1 % (ref 36.0–46.0)
Hemoglobin: 12.2 g/dL (ref 12.0–15.0)
MCH: 31.2 pg (ref 26.0–34.0)
MCHC: 33.8 g/dL (ref 30.0–36.0)
MCV: 92.3 fL (ref 80.0–100.0)
Platelets: 209 10*3/uL (ref 150–400)
RBC: 3.91 MIL/uL (ref 3.87–5.11)
RDW: 12.1 % (ref 11.5–15.5)
WBC: 7.8 10*3/uL (ref 4.0–10.5)
nRBC: 0 % (ref 0.0–0.2)

## 2020-02-07 LAB — URINALYSIS, ROUTINE W REFLEX MICROSCOPIC
Bacteria, UA: NONE SEEN
Bilirubin Urine: NEGATIVE
Glucose, UA: NEGATIVE mg/dL
Ketones, ur: NEGATIVE mg/dL
Leukocytes,Ua: NEGATIVE
Nitrite: NEGATIVE
Protein, ur: NEGATIVE mg/dL
Specific Gravity, Urine: 1.025 (ref 1.005–1.030)
pH: 6 (ref 5.0–8.0)

## 2020-02-07 LAB — HCG, QUANTITATIVE, PREGNANCY: hCG, Beta Chain, Quant, S: 537 m[IU]/mL — ABNORMAL HIGH (ref <5)

## 2020-02-07 LAB — WET PREP, GENITAL
Clue Cells Wet Prep HPF POC: NONE SEEN
Sperm: NONE SEEN
Trich, Wet Prep: NONE SEEN
Yeast Wet Prep HPF POC: NONE SEEN

## 2020-02-07 LAB — POCT PREGNANCY, URINE: Preg Test, Ur: POSITIVE — AB
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
## 2020-02-07 NOTE — Discharge Instructions (Signed)
Return to care   If you have heavier bleeding that soaks through more that 2 pads per hour for an hour or more  If you bleed so much that you feel like you might pass out or you do pass out  If you have significant abdominal pain that is not improved with Tylenol   If you develop a fever > 100.5   Abdominal Pain During Pregnancy  Belly (abdominal) pain is common during pregnancy. There are many possible causes. Most of the time, it is not a serious problem. Other times, it can be a sign that something is wrong with the pregnancy. Always tell your doctor if you have belly pain. Follow these instructions at home:  Do not have sex or put anything in your vagina until your pain goes away completely.  Get plenty of rest until your pain gets better.  Drink enough fluid to keep your pee (urine) pale yellow.  Take over-the-counter and prescription medicines only as told by your doctor.  Keep all follow-up visits as told by your doctor. This is important. Contact a doctor if:  Your pain continues or gets worse after resting.  You have lower belly pain that: ? Comes and goes at regular times. ? Spreads to your back. ? Feels like menstrual cramps.  You have pain or burning when you pee (urinate). Get help right away if:  You have a fever or chills.  You have vaginal bleeding.  You are leaking fluid from your vagina.  You are passing tissue from your vagina.  You throw up (vomit) for more than 24 hours.  You have watery poop (diarrhea) for more than 24 hours.  Your baby is moving less than usual.  You feel very weak or faint.  You have shortness of breath.  You have very bad pain in your upper belly. Summary  Belly (abdominal) pain is common during pregnancy. There are many possible causes.  If you have belly pain during pregnancy, tell your doctor right away.  Keep all follow-up visits as told by your doctor. This is important. This information is not intended to  replace advice given to you by your health care provider. Make sure you discuss any questions you have with your health care provider. Document Revised: 12/28/2018 Document Reviewed: 12/12/2016 Elsevier Patient Education  2020 Elsevier Inc.  

## 2020-02-08 LAB — GC/CHLAMYDIA PROBE AMP (~~LOC~~) NOT AT ARMC
Chlamydia: NEGATIVE
Comment: NEGATIVE
Comment: NORMAL
Neisseria Gonorrhea: NEGATIVE

## 2020-02-09 LAB — CULTURE, OB URINE: Culture: >=100000 — AB

## 2020-02-10 ENCOUNTER — Ambulatory Visit (INDEPENDENT_AMBULATORY_CARE_PROVIDER_SITE_OTHER): Payer: Medicaid Other | Admitting: General Practice

## 2020-02-10 ENCOUNTER — Other Ambulatory Visit: Payer: Self-pay

## 2020-02-10 DIAGNOSIS — O3680X0 Pregnancy with inconclusive fetal viability, not applicable or unspecified: Secondary | ICD-10-CM

## 2020-02-10 LAB — BETA HCG QUANT (REF LAB): hCG Quant: 1829 m[IU]/mL

## 2020-02-10 NOTE — Progress Notes (Signed)
Patient presents to office today for stat bhcg following up from recent MAU visit on 5/17. Patient states pain has decreased since then and currently rates pain at a 3-4. Denies bleeding. Discussed with patient we are monitoring your bhcg levels today, results take approximately 2 hours & will be reviewed with a provider in the office. We will then call you with results. Patient verbalized understanding and provided call back number [phone].   Reviewed results with Dr Shawnie Pons who finds appropriate rise in bhcg levels- patient should have follow up ultrasound in 10 days. U/s scheduled 6/3 @ 10am.   Called patient and informed her of results and ultrasound appt. Patient verbalized understanding.   Chase Caller RN BSN 02/10/20

## 2020-02-10 NOTE — Progress Notes (Signed)
Patient seen and assessed by nursing staff.  Agree with documentation and plan.  

## 2020-02-16 ENCOUNTER — Telehealth: Payer: Self-pay | Admitting: *Deleted
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
## 2020-02-24 ENCOUNTER — Ambulatory Visit: Payer: Medicaid Other

## 2020-02-24 ENCOUNTER — Other Ambulatory Visit: Payer: Medicaid Other

## 2020-02-28 ENCOUNTER — Other Ambulatory Visit: Payer: Self-pay

## 2020-02-28 ENCOUNTER — Ambulatory Visit (INDEPENDENT_AMBULATORY_CARE_PROVIDER_SITE_OTHER): Payer: Medicaid Other | Admitting: *Deleted

## 2020-02-28 ENCOUNTER — Encounter: Payer: Self-pay | Admitting: Family Medicine

## 2020-02-28 ENCOUNTER — Ambulatory Visit
Admission: RE | Admit: 2020-02-28 | Discharge: 2020-02-28 | Disposition: A | Discharge status: Home / Self Care | Payer: Medicaid Other | Source: Ambulatory Visit | Attending: Family Medicine | Admitting: Family Medicine

## 2020-02-28 DIAGNOSIS — O3680X0 Pregnancy with inconclusive fetal viability, not applicable or unspecified: Secondary | ICD-10-CM | POA: Insufficient documentation

## 2020-02-28 DIAGNOSIS — Z712 Person consulting for explanation of examination or test findings: Secondary | ICD-10-CM

## 2020-02-28 NOTE — Progress Notes (Signed)
Chart reviewed - agree with CMA/RN documentation.  ° °

## 2020-02-28 NOTE — Progress Notes (Signed)
[place] results reviewed by Dr. Adrian Blackwater. Pt informed of viable intrauterine pregnancy, size - [redacted]w[redacted]d, EDD 10/12/20. She has sure LMP of 01/10/20 which yields EDD 10/16/20. Pt was advised the due date we will most likely use is 10/16/20 by sure LMP. She voiced understanding. Pt will schedule prenatal care in this office.

## 2020-03-29 ENCOUNTER — Telehealth (INDEPENDENT_AMBULATORY_CARE_PROVIDER_SITE_OTHER): Payer: Medicaid Other | Admitting: *Deleted

## 2020-03-29 ENCOUNTER — Other Ambulatory Visit: Payer: Self-pay

## 2020-03-29 DIAGNOSIS — O99350 Diseases of the nervous system complicating pregnancy, unspecified trimester: Secondary | ICD-10-CM

## 2020-03-29 DIAGNOSIS — O0990 Supervision of high risk pregnancy, unspecified, unspecified trimester: Secondary | ICD-10-CM

## 2020-03-29 DIAGNOSIS — Z3490 Encounter for supervision of normal pregnancy, unspecified, unspecified trimester: Secondary | ICD-10-CM

## 2020-03-29 DIAGNOSIS — O09899 Supervision of other high risk pregnancies, unspecified trimester: Secondary | ICD-10-CM

## 2020-03-29 DIAGNOSIS — R569 Unspecified convulsions: Secondary | ICD-10-CM

## 2020-03-29 DIAGNOSIS — O99891 Other specified diseases and conditions complicating pregnancy: Secondary | ICD-10-CM

## 2020-03-29 DIAGNOSIS — M419 Scoliosis, unspecified: Secondary | ICD-10-CM

## 2020-03-29 DIAGNOSIS — O09219 Supervision of pregnancy with history of pre-term labor, unspecified trimester: Secondary | ICD-10-CM

## 2020-03-29 DIAGNOSIS — O219 Vomiting of pregnancy, unspecified: Secondary | ICD-10-CM

## 2020-03-29 HISTORY — DX: Supervision of other high risk pregnancies, unspecified trimester: O09.899

## 2020-03-29 HISTORY — DX: Supervision of high risk pregnancy, unspecified, unspecified trimester: O09.90

## 2020-03-29 MED ORDER — PRENATAL 27-0.8 MG PO TABS: 1.0000 | ORAL_TABLET | Freq: Every day | ORAL | 11 refills | Status: AC

## 2020-03-29 MED ORDER — PROMETHAZINE HCL 25 MG PO TABS: 25.0000 mg | ORAL_TABLET | Freq: Four times a day (QID) | ORAL | 0 refills | Status: DC | PRN

## 2020-03-29 NOTE — Progress Notes (Signed)
I connected with  Erin Price on 03/29/20 at [phone] by virtual  and verified that I am speaking with the correct person using two identifiers.   I discussed the limitations, risks, security and privacy concerns of performing an evaluation and management service by virtual and the availability of in person appointments. I also discussed with the patient that there may be a patient responsible charge related to this service. The patient expressed understanding and agreed to proceed.  I explained I am completing her New OB Intake today. We discussed Her EDD and that it is based on  sure LMP . I reviewed her allergies, meds, OB History, Medical /Surgical history, and appropriate screenings. I informed her of [facility] services.  She is a [id number] with HX PTD .  She c/o nausea and would like medication. We discussed her options per protocol and she chose phenergan. Rx sent in per protocol.  Also ordered PNV per protocol also.  I explained I will send her the [employer] app and app was sent to her while on phone. She will download after virtual visit.    I explained we will have her take her blood pressure weekly during her pregnancy. 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 I explained  then we will have her take her blood pressure weekly and enter into the app.  I explained she will have some visits in office and some virtually. She already has Sports coach.  I reviewed her new ob  appointment date/ time with her , our location and to wear mask, may have one visitor.  I explained she may  have a pelvic exam, ob bloodwork, hemoglobin a1C, cbg , and  genetic testing if desired,- she does want a panorama. She states she thinks she had a pap with [facility] recently.   I explained I will  Schedule  an [place] at 19 weeks later today and she will see the appointment in MyChart. I explained she may also have another [place] due to her hx of PTD/ early dilation but MD  will review chart and order if medically necessary. She voices understanding.   Sha Burling,RN 03/29/2020  0915

## 2020-03-30 NOTE — Progress Notes (Signed)
I have reviewed this chart and agree with the RN/CMA assessment and management.    K. Meryl Theola Cuellar, M.D. Attending [facility] (Faculty Practice)   

## 2020-04-05 ENCOUNTER — Other Ambulatory Visit (HOSPITAL_COMMUNITY)
Admission: RE | Admit: 2020-04-05 | Discharge: 2020-04-05 | Disposition: A | Discharge status: Home / Self Care | Payer: Medicaid Other | Source: Ambulatory Visit | Attending: Obstetrics and Gynecology | Admitting: Obstetrics and Gynecology

## 2020-04-05 ENCOUNTER — Ambulatory Visit (INDEPENDENT_AMBULATORY_CARE_PROVIDER_SITE_OTHER): Payer: Medicaid Other | Admitting: Obstetrics and Gynecology

## 2020-04-05 ENCOUNTER — Encounter: Payer: Self-pay | Admitting: Obstetrics and Gynecology

## 2020-04-05 ENCOUNTER — Other Ambulatory Visit: Payer: Self-pay

## 2020-04-05 VITALS — Wt 113.0 lb

## 2020-04-05 DIAGNOSIS — Z3009 Encounter for other general counseling and advice on contraception: Secondary | ICD-10-CM | POA: Insufficient documentation

## 2020-04-05 DIAGNOSIS — Z3A12 12 weeks gestation of pregnancy: Secondary | ICD-10-CM | POA: Diagnosis not present

## 2020-04-05 DIAGNOSIS — Z8759 Personal history of other complications of pregnancy, childbirth and the puerperium: Secondary | ICD-10-CM

## 2020-04-05 DIAGNOSIS — O0990 Supervision of high risk pregnancy, unspecified, unspecified trimester: Secondary | ICD-10-CM | POA: Diagnosis not present

## 2020-04-05 DIAGNOSIS — O09899 Supervision of other high risk pregnancies, unspecified trimester: Secondary | ICD-10-CM

## 2020-04-05 DIAGNOSIS — O0991 Supervision of high risk pregnancy, unspecified, first trimester: Secondary | ICD-10-CM

## 2020-04-05 DIAGNOSIS — Z8751 Personal history of pre-term labor: Secondary | ICD-10-CM

## 2020-04-05 DIAGNOSIS — Z98891 History of uterine scar from previous surgery: Secondary | ICD-10-CM

## 2020-04-05 MED ORDER — BLOOD PRESSURE KIT DEVI: 1.0000 | Freq: Two times a day (BID) | 0 refills | Status: DC | PRN
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
## 2020-04-05 NOTE — Addendum Note (Signed)
Addended by: Faythe Casa on: 04/05/2020 11:43 AM   Modules accepted: Orders

## 2020-04-05 NOTE — Progress Notes (Addendum)
New OB packet given  Home Medicaid Form Completed

## 2020-04-06 ENCOUNTER — Encounter: Payer: Self-pay | Admitting: *Deleted

## 2020-04-06 LAB — HEMOGLOBIN A1C
Est. average glucose Bld gHb Est-mCnc: 103 mg/dL
Hgb A1c MFr Bld: 5.2 % (ref 4.8–5.6)

## 2020-04-06 LAB — CBC/D/PLT+RPR+RH+ABO+RUB AB...
Antibody Screen: NEGATIVE
Basophils Absolute: 0.1 10*3/uL (ref 0.0–0.2)
Basos: 1 %
EOS (ABSOLUTE): 0.1 10*3/uL (ref 0.0–0.4)
Eos: 1 %
HCV Ab: <0.1 s/co ratio (ref 0.0–0.9)
HIV Screen 4th Generation wRfx: NONREACTIVE
Hematocrit: 31.7 % — ABNORMAL LOW (ref 34.0–46.6)
Hemoglobin: 10.5 g/dL — ABNORMAL LOW (ref 11.1–15.9)
Hepatitis B Surface Ag: NEGATIVE
Immature Grans (Abs): 0.1 10*3/uL (ref 0.0–0.1)
Immature Granulocytes: 1 %
Lymphocytes Absolute: 1.7 10*3/uL (ref 0.7–3.1)
Lymphs: 18 %
MCH: 30.7 pg (ref 26.6–33.0)
MCHC: 33.1 g/dL (ref 31.5–35.7)
MCV: 93 fL (ref 79–97)
Monocytes Absolute: 0.6 10*3/uL (ref 0.1–0.9)
Monocytes: 6 %
Neutrophils Absolute: 6.9 10*3/uL (ref 1.4–7.0)
Neutrophils: 73 %
Platelets: 190 10*3/uL (ref 150–450)
RBC: 3.42 x10E6/uL — ABNORMAL LOW (ref 3.77–5.28)
RDW: 12.9 % (ref 11.7–15.4)
RPR Ser Ql: NONREACTIVE
Rh Factor: POSITIVE
Rubella Antibodies, IGG: 1.41 index (ref >0.99)
WBC: 9.4 10*3/uL (ref 3.4–10.8)

## 2020-04-06 LAB — HCV INTERPRETATION

## 2020-04-06 MED ORDER — FERROUS SULFATE 325 (65 FE) MG PO TABS: 325.0000 mg | ORAL_TABLET | Freq: Two times a day (BID) | ORAL | 1 refills | Status: DC

## 2020-04-06 NOTE — Addendum Note (Signed)
Addended by: Leroy Libman on: 04/06/2020 03:22 PM   Modules accepted: Orders

## 2020-04-07 LAB — CYTOLOGY - PAP
Chlamydia: NEGATIVE
Comment: NEGATIVE
Comment: NORMAL
Diagnosis: NEGATIVE
Neisseria Gonorrhea: NEGATIVE

## 2020-04-24 ENCOUNTER — Telehealth: Payer: Self-pay | Admitting: Family Medicine

## 2020-04-24 NOTE — Telephone Encounter (Signed)
Rosey Bath from [facility] state they have not been able to contact the patient to get her medication sent out to her.

## 2020-04-25 NOTE — Telephone Encounter (Signed)
[facility] called to confirm delivery to our office of [facility] injections on 04/27/20.

## 2020-04-25 NOTE — Telephone Encounter (Signed)
Pt returned phone call to the office. Pt states she does have a missed call and voicemail from [facility] regarding medication delivery. Pt states she will call them back now.

## 2020-04-25 NOTE — Telephone Encounter (Signed)
Received phone call message from Tobi Bastos from [place] trying to reach a nurse. Called her back, no answer- left message on her voicemail to call us back if she needs assistance.   Attempted to call the patient, no answer- left message asking her to call us back regarding her medication.

## 2020-04-27 ENCOUNTER — Telehealth: Payer: Self-pay | Admitting: Lactation Services

## 2020-04-27 NOTE — Telephone Encounter (Signed)
Called patient. LM to to let her know Cruzita Lederer has been received in the office and she will receive dose at visit on 8/11.

## 2020-05-01 ENCOUNTER — Encounter: Payer: Self-pay | Admitting: *Deleted

## 2020-05-03 ENCOUNTER — Encounter: Payer: Medicaid Other | Admitting: Medical

## 2020-05-03 ENCOUNTER — Encounter: Payer: Self-pay | Admitting: Obstetrics & Gynecology

## 2020-05-03 ENCOUNTER — Ambulatory Visit (INDEPENDENT_AMBULATORY_CARE_PROVIDER_SITE_OTHER): Payer: Medicaid Other | Admitting: Obstetrics & Gynecology

## 2020-05-03 ENCOUNTER — Other Ambulatory Visit: Payer: Self-pay

## 2020-05-03 VITALS — BP 103/53 | HR 90 | Wt 119.2 lb

## 2020-05-03 DIAGNOSIS — O0990 Supervision of high risk pregnancy, unspecified, unspecified trimester: Secondary | ICD-10-CM | POA: Diagnosis not present

## 2020-05-03 DIAGNOSIS — O09899 Supervision of other high risk pregnancies, unspecified trimester: Secondary | ICD-10-CM

## 2020-05-03 MED ORDER — HYDROXYPROGESTERONE CAPROATE 275 MG/1.1ML ~~LOC~~ SOAJ
275.0000 mg | SUBCUTANEOUS | Status: DC
  Administered 2020-05-03 – 2020-06-07 (×3): 275 mg via SUBCUTANEOUS

## 2020-05-03 NOTE — Progress Notes (Signed)
Given first [facility] and she will make next weekly appt at checkout. Jamari Moten,RN

## 2020-05-03 NOTE — Patient Instructions (Signed)
[facility] - [place], [place] - [address]  [address] [place], [place] [address]  Phone:  [phone] Fax:  [phone]

## 2020-05-03 NOTE — Progress Notes (Signed)
   PRENATAL VISIT NOTE  Subjective:  Erin Price is a 29 y.o. [phone] at [redacted]w[redacted]d being seen today for ongoing prenatal care.  She is currently monitored for the following issues for this high-risk pregnancy and has Scoliosis; Migraine; Asthma, mild intermittent; Seizures (HCC); History of substance abuse (HCC); Uterine bleeding, dysfunctional; Supervision of high risk pregnancy, antepartum; History of preterm delivery, currently pregnant; and Unwanted fertility on their problem list.  Patient reports no complaints.  Contractions: Not present. Vag. Bleeding: None.  Movement: Absent. Denies leaking of fluid.   The following portions of the patient's history were reviewed and updated as appropriate: allergies, current medications, past family history, past medical history, past social history, past surgical history and problem list.   Objective:   Vitals:   05/03/20 1043  BP: (!) 103/53  Pulse: 90  Weight: 119 lb 3.2 oz (54.1 kg)    Fetal Status:     Movement: Absent     General:  Alert, oriented and cooperative. Patient is in no acute distress.  Skin: Skin is warm and dry. No rash noted.   Cardiovascular: Normal heart rate noted  Respiratory: Normal respiratory effort, no problems with respiration noted  Abdomen: Soft, gravid, appropriate for gestational age.  Pain/Pressure: Absent     Pelvic: Cervical exam deferred        Extremities: Normal range of motion.  Edema: None  Mental Status: Normal mood and affect. Normal behavior. Normal judgment and thought content.   Assessment and Plan:  Pregnancy: [id number] at [redacted]w[redacted]d 1. Supervision of high risk pregnancy, antepartum - HYDROXYprogesterone caproate (Makena) autoinjector 275 mg  2. History of preterm delivery, currently pregnant Starts 17-P and cervical surveillance screening. Of note pt had short cervix on last visit.  - [place] MFM OB Transvaginal; Future - HYDROXYprogesterone caproate (Makena) autoinjector 275 mg  Preterm labor  symptoms and general obstetric precautions including but not limited to vaginal bleeding, contractions, leaking of fluid and fetal movement were reviewed in detail with the patient. Please refer to After Visit Summary for other counseling recommendations.   No follow-ups on file.  Future Appointments  Date Time Provider Department Center  05/10/2020 10:20 AM [facility]URSE [facility]  05/17/2020 10:20 AM [facility] NURSE [facility]  05/22/2020  8:15 AM [facility] NURSE [facility]  05/22/2020  8:30 AM [facility] US3 [facility]  05/24/2020 10:15 AM Anyanwu, Jethro Bastos, MD [facility]    Malachy Chamber, MD

## 2020-05-10 ENCOUNTER — Other Ambulatory Visit: Payer: Self-pay

## 2020-05-10 ENCOUNTER — Encounter: Payer: Self-pay | Admitting: *Deleted

## 2020-05-10 ENCOUNTER — Ambulatory Visit (INDEPENDENT_AMBULATORY_CARE_PROVIDER_SITE_OTHER): Payer: Medicaid Other | Admitting: *Deleted

## 2020-05-10 VITALS — BP 102/66 | HR 101 | Ht 60.0 in | Wt 118.1 lb

## 2020-05-10 DIAGNOSIS — O09899 Supervision of other high risk pregnancies, unspecified trimester: Secondary | ICD-10-CM | POA: Diagnosis not present

## 2020-05-10 NOTE — Progress Notes (Signed)
Erin Price 275 mg auto injector administered as scheduled. Pt tolerated well. She denies UC's or vaginal bleeding and reports no problems. Next injection scheduled 05/17/20.

## 2020-05-17 ENCOUNTER — Ambulatory Visit: Payer: Medicaid Other | Admitting: *Deleted

## 2020-05-17 ENCOUNTER — Ambulatory Visit: Payer: Medicaid Other

## 2020-05-17 ENCOUNTER — Other Ambulatory Visit: Payer: Self-pay

## 2020-05-17 DIAGNOSIS — O09899 Supervision of other high risk pregnancies, unspecified trimester: Secondary | ICD-10-CM

## 2020-05-17 DIAGNOSIS — O0990 Supervision of high risk pregnancy, unspecified, unspecified trimester: Secondary | ICD-10-CM

## 2020-05-17 NOTE — Progress Notes (Signed)
Patient left without being seen. Informed registrar she needed to be somewhere. Yahia Bottger,RN

## 2020-05-17 NOTE — Progress Notes (Signed)
Patient was assessed and managed by nursing staff during this encounter. I have reviewed the chart and agree with the documentation and plan. I have also made any necessary editorial changes.  [place] Bing, MD 05/17/2020 6:34 PM

## 2020-05-22 ENCOUNTER — Ambulatory Visit: Payer: Medicaid Other | Admitting: *Deleted

## 2020-05-22 ENCOUNTER — Encounter: Payer: Self-pay | Admitting: *Deleted

## 2020-05-22 ENCOUNTER — Other Ambulatory Visit: Payer: Self-pay | Admitting: *Deleted

## 2020-05-22 ENCOUNTER — Ambulatory Visit: Payer: Medicaid Other | Attending: Obstetrics and Gynecology

## 2020-05-22 ENCOUNTER — Other Ambulatory Visit: Payer: Self-pay

## 2020-05-22 DIAGNOSIS — O0990 Supervision of high risk pregnancy, unspecified, unspecified trimester: Secondary | ICD-10-CM | POA: Diagnosis not present

## 2020-05-22 DIAGNOSIS — G40909 Epilepsy, unspecified, not intractable, without status epilepticus: Secondary | ICD-10-CM | POA: Diagnosis not present

## 2020-05-22 DIAGNOSIS — R569 Unspecified convulsions: Secondary | ICD-10-CM | POA: Diagnosis present

## 2020-05-22 DIAGNOSIS — O09212 Supervision of pregnancy with history of pre-term labor, second trimester: Secondary | ICD-10-CM

## 2020-05-22 DIAGNOSIS — O99352 Diseases of the nervous system complicating pregnancy, second trimester: Secondary | ICD-10-CM

## 2020-05-22 DIAGNOSIS — Z363 Encounter for antenatal screening for malformations: Secondary | ICD-10-CM

## 2020-05-22 DIAGNOSIS — O09899 Supervision of other high risk pregnancies, unspecified trimester: Secondary | ICD-10-CM

## 2020-05-22 DIAGNOSIS — Z362 Encounter for other antenatal screening follow-up: Secondary | ICD-10-CM

## 2020-05-22 DIAGNOSIS — Z3A19 19 weeks gestation of pregnancy: Secondary | ICD-10-CM

## 2020-05-24 ENCOUNTER — Encounter: Payer: Medicaid Other | Admitting: Obstetrics & Gynecology

## 2020-05-24 ENCOUNTER — Encounter: Payer: Self-pay | Admitting: Obstetrics & Gynecology

## 2020-06-01 ENCOUNTER — Other Ambulatory Visit: Payer: Self-pay

## 2020-06-01 ENCOUNTER — Ambulatory Visit (INDEPENDENT_AMBULATORY_CARE_PROVIDER_SITE_OTHER): Payer: Medicaid Other | Admitting: *Deleted

## 2020-06-01 VITALS — Wt 126.1 lb

## 2020-06-01 DIAGNOSIS — O09899 Supervision of other high risk pregnancies, unspecified trimester: Secondary | ICD-10-CM

## 2020-06-01 DIAGNOSIS — O0990 Supervision of high risk pregnancy, unspecified, unspecified trimester: Secondary | ICD-10-CM

## 2020-06-01 MED ORDER — HYDROXYPROGESTERONE CAPROATE 275 MG/1.1ML ~~LOC~~ SOAJ
275.0000 mg | Freq: Once | SUBCUTANEOUS | Status: AC
Start: 2020-06-01 — End: 2020-06-01
  Administered 2020-06-01: 275 mg via SUBCUTANEOUS

## 2020-06-01 NOTE — Progress Notes (Signed)
Here for 17p today. FHR 145.

## 2020-06-06 ENCOUNTER — Encounter: Payer: Self-pay | Admitting: General Practice

## 2020-06-07 ENCOUNTER — Ambulatory Visit (INDEPENDENT_AMBULATORY_CARE_PROVIDER_SITE_OTHER): Payer: Medicaid Other | Admitting: General Practice

## 2020-06-07 ENCOUNTER — Encounter: Payer: Self-pay | Admitting: General Practice

## 2020-06-07 ENCOUNTER — Other Ambulatory Visit: Payer: Self-pay

## 2020-06-07 VITALS — BP 101/59 | HR 101 | Ht 61.0 in | Wt 124.0 lb

## 2020-06-07 DIAGNOSIS — O09899 Supervision of other high risk pregnancies, unspecified trimester: Secondary | ICD-10-CM

## 2020-06-07 DIAGNOSIS — O0990 Supervision of high risk pregnancy, unspecified, unspecified trimester: Secondary | ICD-10-CM

## 2020-06-07 NOTE — Progress Notes (Signed)
Erin Price here for 17-P  Injection. Discussed with patient her performing 17p injections herself at home. Provided demonstration/education using teaching auto-injector tool and patient provided return demonstration. Patient administered her own injection without complication. Patient will return for next OB visit on 9/20. FHR 146bpm today. Makena auto-injector refill placed through [facility] [phone].   [name], RN 06/07/2020  3:47 PM

## 2020-06-08 ENCOUNTER — Ambulatory Visit: Payer: Medicaid Other

## 2020-06-08 NOTE — Progress Notes (Signed)
Patient was assessed and managed by nursing staff during this encounter. I have reviewed the chart and agree with the documentation and plan. I have also made any necessary editorial changes.  Faige Seely, MD 06/08/2020 10:58 PM   

## 2020-06-12 ENCOUNTER — Other Ambulatory Visit: Payer: Self-pay

## 2020-06-12 ENCOUNTER — Ambulatory Visit (INDEPENDENT_AMBULATORY_CARE_PROVIDER_SITE_OTHER): Payer: Medicaid Other | Admitting: Obstetrics and Gynecology

## 2020-06-12 VITALS — BP 102/63 | HR 87 | Wt 123.0 lb

## 2020-06-12 DIAGNOSIS — O09899 Supervision of other high risk pregnancies, unspecified trimester: Secondary | ICD-10-CM

## 2020-06-12 DIAGNOSIS — J4520 Mild intermittent asthma, uncomplicated: Secondary | ICD-10-CM

## 2020-06-12 DIAGNOSIS — R569 Unspecified convulsions: Secondary | ICD-10-CM

## 2020-06-12 DIAGNOSIS — O0990 Supervision of high risk pregnancy, unspecified, unspecified trimester: Secondary | ICD-10-CM

## 2020-06-12 DIAGNOSIS — Z3A22 22 weeks gestation of pregnancy: Secondary | ICD-10-CM | POA: Insufficient documentation

## 2020-06-12 DIAGNOSIS — F1911 Other psychoactive substance abuse, in remission: Secondary | ICD-10-CM

## 2020-06-12 NOTE — Patient Instructions (Signed)
Preventing Preterm Birth Preterm birth is when your baby is delivered between 20 weeks and 37 weeks of pregnancy. A full-term pregnancy lasts for at least 37 weeks. Preterm birth can be dangerous for your baby because the last few weeks of pregnancy are an important time for your baby's brain and lungs to grow. Many things can cause a baby to be born early. Sometimes the cause is not known. There are certain factors that make you more likely to experience preterm birth, such as:  Having a previous baby born preterm.  Being pregnant with twins or other multiples.  Having had fertility treatment.  Being overweight or underweight at the start of your pregnancy.  Having any of the following during pregnancy: ? An infection, including a urinary tract infection (UTI) or an STI (sexually transmitted infection). ? High blood pressure. ? Diabetes. ? Vaginal bleeding.  Being age 35 or older.  Being age 18 or younger.  Getting pregnant within 6 months of a previous pregnancy.  Suffering extreme stress or physical or emotional abuse during pregnancy.  Standing for long periods of time during pregnancy, such as working at a job that requires standing. What are the risks? The most serious risk of preterm birth is that the baby may not survive. This is more likely to happen if a baby is born before 34 weeks. Other risks and complications of preterm birth may include your baby having:  Breathing problems.  Brain damage that affects movement and coordination (cerebral palsy).  Feeding difficulties.  Vision or hearing problems.  Infections or inflammation of the digestive tract (colitis).  Developmental delays.  Learning disabilities.  Higher risk for diabetes, heart disease, and high blood pressure later in life. What can I do to lower my risk?  Medical care The most important thing you can do to lower your risk for preterm birth is to get routine medical care during pregnancy (prenatal  care). If you have a high risk of preterm birth, you may be referred to a health care provider who specializes in managing high-risk pregnancies (perinatologist). You may be given medicine to help prevent preterm birth. Lifestyle changes Certain lifestyle changes can also lower your risk of preterm birth:  Wait at least 6 months after a pregnancy to become pregnant again.  Try to plan pregnancy for when you are between 19 and 35 years old.  Get to a healthy weight before getting pregnant. If you are overweight, work with your health care provider to safely lose weight.  Do not use any products that contain nicotine or tobacco, such as cigarettes and e-cigarettes. If you need help quitting, ask your health care provider.  Do not drink alcohol.  Do not use drugs. Where to find support For more support, consider:  Talking with your health care provider.  Talking with a therapist or substance abuse counselor, if you need help quitting.  Working with a diet and nutrition specialist (dietitian) or a personal trainer to maintain a healthy weight.  Joining a support group. Where to find more information Learn more about preventing preterm birth from:  Centers for Disease Control and Prevention: cdc.gov/reproductivehealth/maternalinfanthealth/pretermbirth.htm  March of Dimes: marchofdimes.org/complications/premature-babies.aspx  American Pregnancy Association: americanpregnancy.org/labor-and-birth/premature-labor Contact a health care provider if:  You have any of the following signs of preterm labor before 37 weeks: ? A change or increase in vaginal discharge. ? Fluid leaking from your vagina. ? Pressure or cramps in your lower abdomen. ? A backache that does not go away or gets worse. ?   Regular tightening (contractions) in your lower abdomen. Summary  Preterm birth means having your baby during weeks 20-37 of pregnancy.  Preterm birth may put your baby at risk for physical and  mental problems.  Getting good prenatal care can help prevent preterm birth.  You can lower your risk of preterm birth by making certain lifestyle changes, such as not smoking and not using alcohol. This information is not intended to replace advice given to you by your health care provider. Make sure you discuss any questions you have with your health care provider. Document Revised: 08/22/2017 Document Reviewed: 05/18/2016 Elsevier Patient Education  2020 Elsevier Inc.   Preterm Labor and Birth Information Pregnancy normally lasts 39-41 weeks. Preterm labor is when labor starts early. It starts before you have been pregnant for 37 whole weeks. What are the risk factors for preterm labor? Preterm labor is more likely to occur in women who:  Have an infection while pregnant.  Have a cervix that is short.  Have gone into preterm labor before.  Have had surgery on their cervix.  Are younger than age 17.  Are older than age 35.  Are African American.  Are pregnant with two or more babies.  Take street drugs while pregnant.  Smoke while pregnant.  Do not gain enough weight while pregnant.  Got pregnant right after another pregnancy. What are the symptoms of preterm labor? Symptoms of preterm labor include:  Cramps. The cramps may feel like the cramps some women get during their period. The cramps may happen with watery poop (diarrhea).  Pain in the belly (abdomen).  Pain in the lower back.  Regular contractions or tightening. It may feel like your belly is getting tighter.  Pressure in the lower belly that seems to get stronger.  More fluid (discharge) leaking from the vagina. The fluid may be watery or bloody.  Water breaking. Why is it important to notice signs of preterm labor? Babies who are born early may not be fully developed. They have a higher chance for:  Long-term heart problems.  Long-term lung problems.  Trouble controlling body systems, like  breathing.  Bleeding in the brain.  A condition called cerebral palsy.  Learning difficulties.  Death. These risks are highest for babies who are born before 34 weeks of pregnancy. How is preterm labor treated? Treatment depends on:  How long you were pregnant.  Your condition.  The health of your baby. Treatment may involve:  Having a stitch (suture) placed in your cervix. When you give birth, your cervix opens so the baby can come out. The stitch keeps the cervix from opening too soon.  Staying at the hospital.  Taking or getting medicines, such as: ? Hormone medicines. ? Medicines to stop contractions. ? Medicines to help the baby's lungs develop. ? Medicines to prevent your baby from having cerebral palsy. What should I do if I am in preterm labor? If you think you are going into labor too soon, call your doctor right away. How can I prevent preterm labor?  Do not use any tobacco products. ? Examples of these are cigarettes, chewing tobacco, and e-cigarettes. ? If you need help quitting, ask your doctor.  Do not use street drugs.  Do not use any medicines unless you ask your doctor if they are safe for you.  Talk with your doctor before taking any herbal supplements.  Make sure you gain enough weight.  Watch for infection. If you think you might have an infection, get   right away.  If you have gone into preterm labor before, tell your doctor. This information is not intended to replace advice given to you by your health care provider. Make sure you discuss any questions you have with your health care provider. Document Revised: 01/01/2019 Document Reviewed: 01/31/2016 Elsevier Patient Education  2020 [employer].

## 2020-06-12 NOTE — Progress Notes (Addendum)
   PRENATAL VISIT NOTE  Subjective:  Erin Price is a 29 y.o. [phone] at [redacted]w[redacted]d being seen today for ongoing prenatal care.  She is currently monitored for the following issues for this high-risk pregnancy and has Scoliosis; Migraine; Asthma, mild intermittent; Seizures (HCC); History of substance abuse (HCC); Uterine bleeding, dysfunctional; Supervision of high risk pregnancy, antepartum; History of preterm delivery, currently pregnant; Unwanted fertility; and [redacted] weeks gestation of pregnancy on their problem list.  Patient doing well with no acute concerns today. She reports no complaints.  Contractions: Not present. Vag. Bleeding: None.  Movement: Present. Denies leaking of fluid.    Pt took her progesterone injection on her own last week.  Per pt she will continue to self inject.  The following portions of the patient's history were reviewed and updated as appropriate: allergies, current medications, past family history, past medical history, past social history, past surgical history and problem list. Problem list updated.  Objective:   Vitals:   06/12/20 1439  BP: 102/63  Pulse: 87  Weight: 123 lb (55.8 kg)    Fetal Status: Fetal Heart Rate (bpm): 144   Movement: Present     General:  Alert, oriented and cooperative. Patient is in no acute distress.  Skin: Skin is warm and dry. No rash noted.   Cardiovascular: Normal heart rate noted  Respiratory: Normal respiratory effort, no problems with respiration noted  Abdomen: Soft, gravid, appropriate for gestational age.  Pain/Pressure: Present     Pelvic: Cervical exam deferred        Extremities: Normal range of motion.  Edema: None  Mental Status:  Normal mood and affect. Normal behavior. Normal judgment and thought content.   Assessment and Plan:  Pregnancy: [id number] at [redacted]w[redacted]d  1. Seizures (HCC) Usually associated with migraines, See Erin Price note, neuro states she is not epileptic  2. History of substance abuse  (HCC)   3. Supervision of high risk pregnancy, antepartum AFP drawn today  4. History of preterm delivery, currently pregnant Pt continues with Erin Price, will self inject q Thursday Last cervical length 3.2 CM, she has repeat scan on 9/29  5. [redacted] weeks gestation of pregnancy   6. Mild intermittent asthma without complication   Preterm labor symptoms and general obstetric precautions including but not limited to vaginal bleeding, contractions, leaking of fluid and fetal movement were reviewed in detail with the patient.  Please refer to After Visit Summary for other counseling recommendations.   Return in about 4 weeks (around 07/10/2020) for [facility], in person.   Erin Aloe, MD

## 2020-06-13 LAB — AFP, SERUM, OPEN SPINA BIFIDA
AFP MoM: 0.78
AFP Value: 66.9 ng/mL
Gest. Age on Collection Date: 22 weeks
Maternal Age At EDD: 29.2 yr
OSBR Risk 1 IN: 10000
Test Results:: NEGATIVE
Weight: 123 [lb_av]

## 2020-06-19 ENCOUNTER — Encounter: Payer: Self-pay | Admitting: *Deleted

## 2020-06-19 ENCOUNTER — Ambulatory Visit: Payer: Medicaid Other | Admitting: *Deleted

## 2020-06-19 ENCOUNTER — Other Ambulatory Visit: Payer: Self-pay | Admitting: *Deleted

## 2020-06-19 ENCOUNTER — Other Ambulatory Visit: Payer: Self-pay

## 2020-06-19 ENCOUNTER — Ambulatory Visit: Payer: Medicaid Other | Attending: Obstetrics and Gynecology

## 2020-06-19 DIAGNOSIS — O0990 Supervision of high risk pregnancy, unspecified, unspecified trimester: Secondary | ICD-10-CM | POA: Diagnosis present

## 2020-06-19 DIAGNOSIS — Z362 Encounter for other antenatal screening follow-up: Secondary | ICD-10-CM

## 2020-06-19 DIAGNOSIS — O09899 Supervision of other high risk pregnancies, unspecified trimester: Secondary | ICD-10-CM | POA: Diagnosis present

## 2020-06-19 DIAGNOSIS — O99352 Diseases of the nervous system complicating pregnancy, second trimester: Secondary | ICD-10-CM

## 2020-06-19 DIAGNOSIS — O09212 Supervision of pregnancy with history of pre-term labor, second trimester: Secondary | ICD-10-CM

## 2020-06-19 DIAGNOSIS — Z3A23 23 weeks gestation of pregnancy: Secondary | ICD-10-CM

## 2020-06-19 DIAGNOSIS — G40909 Epilepsy, unspecified, not intractable, without status epilepticus: Secondary | ICD-10-CM

## 2020-07-10 ENCOUNTER — Other Ambulatory Visit: Payer: Self-pay

## 2020-07-10 ENCOUNTER — Ambulatory Visit (INDEPENDENT_AMBULATORY_CARE_PROVIDER_SITE_OTHER): Payer: Medicaid Other | Admitting: Obstetrics and Gynecology

## 2020-07-10 VITALS — BP 96/51 | HR 91 | Wt 129.5 lb

## 2020-07-10 DIAGNOSIS — O0990 Supervision of high risk pregnancy, unspecified, unspecified trimester: Secondary | ICD-10-CM | POA: Diagnosis not present

## 2020-07-10 DIAGNOSIS — J4520 Mild intermittent asthma, uncomplicated: Secondary | ICD-10-CM

## 2020-07-10 DIAGNOSIS — O09899 Supervision of other high risk pregnancies, unspecified trimester: Secondary | ICD-10-CM

## 2020-07-10 DIAGNOSIS — Z3A26 26 weeks gestation of pregnancy: Secondary | ICD-10-CM | POA: Diagnosis not present

## 2020-07-10 DIAGNOSIS — Z23 Encounter for immunization: Secondary | ICD-10-CM

## 2020-07-10 NOTE — Progress Notes (Signed)
Pt states she has one Makena auto-injector remaining. [facility]. Makena delivery scheduled for 07/17/20. Pt notified.   Fleet Contras RN  07/10/20

## 2020-07-10 NOTE — Patient Instructions (Signed)
Glucose Tolerance Test During Pregnancy Why am I having this test? The glucose tolerance test (GTT) is done to check how your body processes sugar (glucose). This is one of several tests used to diagnose diabetes that develops during pregnancy (gestational diabetes mellitus). Gestational diabetes is a temporary form of diabetes that some women develop during pregnancy. It usually occurs during the second trimester of pregnancy and goes away after delivery. Testing (screening) for gestational diabetes usually occurs between 24 and 28 weeks of pregnancy. You may have the GTT test after having a 1-hour glucose screening test if the results from that test indicate that you may have gestational diabetes. You may also have this test if:  You have a history of gestational diabetes.  You have a history of giving birth to very large babies or have experienced repeated fetal loss (stillbirth).  You have signs and symptoms of diabetes, such as: ? Changes in your vision. ? Tingling or numbness in your hands or feet. ? Changes in hunger, thirst, and urination that are not otherwise explained by your pregnancy. What is being tested? This test measures the amount of glucose in your blood at different times during a period of 3 hours. This indicates how well your body is able to process glucose. What kind of sample is taken?  Blood samples are required for this test. They are usually collected by inserting a needle into a blood vessel. How do I prepare for this test?  For 3 days before your test, eat normally. Have plenty of carbohydrate-rich foods.  Follow instructions from your health care provider about: ? Eating or drinking restrictions on the day of the test. You may be asked to not eat or drink anything other than water (fast) starting 8-10 hours before the test. ? Changing or stopping your regular medicines. Some medicines may interfere with this test. Tell a health care provider about:  All  medicines you are taking, including vitamins, herbs, eye drops, creams, and over-the-counter medicines.  Any blood disorders you have.  Any surgeries you have had.  Any medical conditions you have. What happens during the test? First, your blood glucose will be measured. This is referred to as your fasting blood glucose, since you fasted before the test. Then, you will drink a glucose solution that contains a certain amount of glucose. Your blood glucose will be measured again 1, 2, and 3 hours after drinking the solution. This test takes about 3 hours to complete. You will need to stay at the testing location during this time. During the testing period:  Do not eat or drink anything other than the glucose solution.  Do not exercise.  Do not use any products that contain nicotine or tobacco, such as cigarettes and e-cigarettes. If you need help stopping, ask your health care provider. The testing procedure may vary among health care providers and hospitals. How are the results reported? Your results will be reported as milligrams of glucose per deciliter of blood (mg/dL) or millimoles per liter (mmol/L). Your health care provider will compare your results to normal ranges that were established after testing a large group of people (reference ranges). Reference ranges may vary among labs and hospitals. For this test, common reference ranges are:  Fasting: less than 95-105 mg/dL (5.3-5.8 mmol/L).  1 hour after drinking glucose: less than 180-190 mg/dL (10.0-10.5 mmol/L).  2 hours after drinking glucose: less than 155-165 mg/dL (8.6-9.2 mmol/L).  3 hours after drinking glucose: 140-145 mg/dL (7.8-8.1 mmol/L). What do the   results mean? Results within reference ranges are considered normal, meaning that your glucose levels are well-controlled. If two or more of your blood glucose levels are high, you may be diagnosed with gestational diabetes. If only one level is high, your health care  provider may suggest repeat testing or other tests to confirm a diagnosis. Talk with your health care provider about what your results mean. Questions to ask your health care provider Ask your health care provider, or the department that is doing the test:  When will my results be ready?  How will I get my results?  What are my treatment options?  What other tests do I need?  What are my next steps? Summary  The glucose tolerance test (GTT) is one of several tests used to diagnose diabetes that develops during pregnancy (gestational diabetes mellitus). Gestational diabetes is a temporary form of diabetes that some women develop during pregnancy.  You may have the GTT test after having a 1-hour glucose screening test if the results from that test indicate that you may have gestational diabetes. You may also have this test if you have any symptoms or risk factors for gestational diabetes.  Talk with your health care provider about what your results mean. This information is not intended to replace advice given to you by your health care provider. Make sure you discuss any questions you have with your health care provider. Document Revised: 12/31/2018 Document Reviewed: 04/21/2017 Elsevier Patient Education  2020 Elsevier Inc.  

## 2020-07-10 NOTE — Progress Notes (Signed)
   PRENATAL VISIT NOTE  Subjective:  Erin Price is a 29 y.o. [phone] at [redacted]w[redacted]d being seen today for ongoing prenatal care.  She is currently monitored for the following issues for this high-risk pregnancy and has Scoliosis; Migraine; Asthma, mild intermittent; Seizures (HCC); History of substance abuse (HCC); Uterine bleeding, dysfunctional; Supervision of high risk pregnancy, antepartum; History of preterm delivery, currently pregnant; Unwanted fertility; [redacted] weeks gestation of pregnancy; and [redacted] weeks gestation of pregnancy on their problem list.  Patient doing well with no acute concerns today. She reports no complaints.  Contractions: Not present. Vag. Bleeding: None.  Movement: Present. Denies leaking of fluid.   The following portions of the patient's history were reviewed and updated as appropriate: allergies, current medications, past family history, past medical history, past social history, past surgical history and problem list. Problem list updated.  Objective:   Vitals:   07/10/20 1607  BP: (!) 96/51  Pulse: 91  Weight: 129 lb 8 oz (58.7 kg)    Fetal Status: Fetal Heart Rate (bpm): 140   Movement: Present     General:  Alert, oriented and cooperative. Patient is in no acute distress.  Skin: Skin is warm and dry. No rash noted.   Cardiovascular: Normal heart rate noted  Respiratory: Normal respiratory effort, no problems with respiration noted  Abdomen: Soft, gravid, appropriate for gestational age.  Pain/Pressure: Absent     Pelvic: Cervical exam deferred        Extremities: Normal range of motion.  Edema: None  Mental Status:  Normal mood and affect. Normal behavior. Normal judgment and thought content.   Assessment and Plan:  Pregnancy: [id number] at [redacted]w[redacted]d  1. Supervision of high risk pregnancy, antepartum 2 hour GTT next visit - Tdap vaccine greater than or equal to 7yo IM - Flu Vaccine QUAD 36+ mos IM  2. Mild intermittent asthma without complication   3.  History of preterm delivery, currently pregnant Pt denies s/sx of PTL, continues with makena  4. [redacted] weeks gestation of pregnancy   Preterm labor symptoms and general obstetric precautions including but not limited to vaginal bleeding, contractions, leaking of fluid and fetal movement were reviewed in detail with the patient.  Please refer to After Visit Summary for other counseling recommendations.   Return in about 2 weeks (around 07/24/2020) for [facility], in person, 2 hr GTT.   Mariel Aloe, MD

## 2020-07-12 ENCOUNTER — Encounter: Payer: Self-pay | Admitting: *Deleted

## 2020-07-17 ENCOUNTER — Encounter: Payer: Self-pay | Admitting: *Deleted

## 2020-07-17 ENCOUNTER — Ambulatory Visit: Payer: Medicaid Other | Admitting: *Deleted

## 2020-07-17 ENCOUNTER — Ambulatory Visit: Payer: Medicaid Other | Attending: Obstetrics and Gynecology

## 2020-07-17 ENCOUNTER — Other Ambulatory Visit: Payer: Self-pay

## 2020-07-17 ENCOUNTER — Other Ambulatory Visit: Payer: Self-pay | Admitting: *Deleted

## 2020-07-17 DIAGNOSIS — O09899 Supervision of other high risk pregnancies, unspecified trimester: Secondary | ICD-10-CM

## 2020-07-17 DIAGNOSIS — Z362 Encounter for other antenatal screening follow-up: Secondary | ICD-10-CM | POA: Insufficient documentation

## 2020-07-17 DIAGNOSIS — O09212 Supervision of pregnancy with history of pre-term labor, second trimester: Secondary | ICD-10-CM

## 2020-07-17 DIAGNOSIS — O0990 Supervision of high risk pregnancy, unspecified, unspecified trimester: Secondary | ICD-10-CM | POA: Insufficient documentation

## 2020-07-17 DIAGNOSIS — O99352 Diseases of the nervous system complicating pregnancy, second trimester: Secondary | ICD-10-CM

## 2020-07-17 DIAGNOSIS — G40909 Epilepsy, unspecified, not intractable, without status epilepticus: Secondary | ICD-10-CM | POA: Diagnosis not present

## 2020-07-17 DIAGNOSIS — E669 Obesity, unspecified: Secondary | ICD-10-CM

## 2020-07-17 DIAGNOSIS — Z3A27 27 weeks gestation of pregnancy: Secondary | ICD-10-CM

## 2020-07-19 ENCOUNTER — Other Ambulatory Visit: Payer: Self-pay

## 2020-07-19 DIAGNOSIS — O0990 Supervision of high risk pregnancy, unspecified, unspecified trimester: Secondary | ICD-10-CM

## 2020-07-21 ENCOUNTER — Ambulatory Visit: Payer: Medicaid Other

## 2020-07-25 ENCOUNTER — Ambulatory Visit (INDEPENDENT_AMBULATORY_CARE_PROVIDER_SITE_OTHER): Payer: Medicaid Other | Admitting: Obstetrics & Gynecology

## 2020-07-25 ENCOUNTER — Other Ambulatory Visit: Payer: Self-pay

## 2020-07-25 ENCOUNTER — Other Ambulatory Visit: Payer: Medicaid Other

## 2020-07-25 VITALS — BP 106/58 | HR 83 | Wt 130.7 lb

## 2020-07-25 DIAGNOSIS — O0990 Supervision of high risk pregnancy, unspecified, unspecified trimester: Secondary | ICD-10-CM

## 2020-07-25 DIAGNOSIS — O09899 Supervision of other high risk pregnancies, unspecified trimester: Secondary | ICD-10-CM

## 2020-07-25 DIAGNOSIS — R569 Unspecified convulsions: Secondary | ICD-10-CM

## 2020-07-25 NOTE — Progress Notes (Signed)
   PRENATAL VISIT NOTE  Subjective:  Erin Price is a 29 y.o. [phone] at [redacted]w[redacted]d being seen today for ongoing prenatal care.  She is currently monitored for the following issues for this high-risk pregnancy and has Scoliosis; Migraine; Asthma, mild intermittent; Seizures (HCC); History of substance abuse (HCC); Uterine bleeding, dysfunctional; Supervision of high risk pregnancy, antepartum; History of preterm delivery, currently pregnant; and Unwanted fertility on their problem list.  Patient reports no complaints.  Contractions: Not present. Vag. Bleeding: None.  Movement: Present. Denies leaking of fluid.   The following portions of the patient's history were reviewed and updated as appropriate: allergies, current medications, past family history, past medical history, past social history, past surgical history and problem list.   Objective:   Vitals:   07/25/20 0912  BP: (!) 106/58  Pulse: 83  Weight: 130 lb 11.2 oz (59.3 kg)    Fetal Status: Fetal Heart Rate (bpm): 158   Movement: Present     General:  Alert, oriented and cooperative. Patient is in no acute distress.  Skin: Skin is warm and dry. No rash noted.   Cardiovascular: Normal heart rate noted  Respiratory: Normal respiratory effort, no problems with respiration noted  Abdomen: Soft, gravid, appropriate for gestational age.  Pain/Pressure: Absent     Pelvic: Cervical exam deferred        Extremities: Normal range of motion.  Edema: None  Mental Status: Normal mood and affect. Normal behavior. Normal judgment and thought content.   Assessment and Plan:  Pregnancy: [id number] at [redacted]w[redacted]d 1. Supervision of high risk pregnancy, antepartum [place] 10/25 reviewed  2. History of preterm delivery, currently pregnant Makena dosed at home  3. Seizures (HCC) No medications  Preterm labor symptoms and general obstetric precautions including but not limited to vaginal bleeding, contractions, leaking of fluid and fetal movement were  reviewed in detail with the patient. Please refer to After Visit Summary for other counseling recommendations.   No follow-ups on file.  Future Appointments  Date Time Provider Department Center  08/21/2020  1:00 PM [facility] NURSE [facility]  08/21/2020  1:15 PM [facility]    Scheryl Darter, MD

## 2020-07-25 NOTE — Patient Instructions (Addendum)

## 2020-07-26 LAB — GLUCOSE TOLERANCE, 2 HOURS W/ 1HR
Glucose, 1 hour: 114 mg/dL (ref 65–179)
Glucose, 2 hour: 99 mg/dL (ref 65–152)
Glucose, Fasting: 74 mg/dL (ref 65–91)

## 2020-07-26 LAB — CBC
Hematocrit: 29.9 % — ABNORMAL LOW (ref 34.0–46.6)
Hemoglobin: 10.2 g/dL — ABNORMAL LOW (ref 11.1–15.9)
MCH: 32.1 pg (ref 26.6–33.0)
MCHC: 34.1 g/dL (ref 31.5–35.7)
MCV: 94 fL (ref 79–97)
Platelets: 179 10*3/uL (ref 150–450)
RBC: 3.18 x10E6/uL — ABNORMAL LOW (ref 3.77–5.28)
RDW: 12.3 % (ref 11.7–15.4)
WBC: 9.4 10*3/uL (ref 3.4–10.8)

## 2020-07-26 LAB — RPR: RPR Ser Ql: NONREACTIVE

## 2020-07-26 LAB — HIV ANTIBODY (ROUTINE TESTING W REFLEX): HIV Screen 4th Generation wRfx: NONREACTIVE

## 2020-08-21 ENCOUNTER — Ambulatory Visit: Payer: Medicaid Other | Admitting: *Deleted

## 2020-08-21 ENCOUNTER — Ambulatory Visit: Payer: Medicaid Other | Attending: Obstetrics

## 2020-08-21 ENCOUNTER — Encounter: Payer: Self-pay | Admitting: *Deleted

## 2020-08-21 ENCOUNTER — Encounter: Payer: Medicaid Other | Admitting: Obstetrics and Gynecology

## 2020-08-21 ENCOUNTER — Other Ambulatory Visit: Payer: Self-pay

## 2020-08-21 DIAGNOSIS — Z3A32 32 weeks gestation of pregnancy: Secondary | ICD-10-CM

## 2020-08-21 DIAGNOSIS — O0990 Supervision of high risk pregnancy, unspecified, unspecified trimester: Secondary | ICD-10-CM

## 2020-08-21 DIAGNOSIS — O99353 Diseases of the nervous system complicating pregnancy, third trimester: Secondary | ICD-10-CM | POA: Diagnosis not present

## 2020-08-21 DIAGNOSIS — Z362 Encounter for other antenatal screening follow-up: Secondary | ICD-10-CM | POA: Diagnosis not present

## 2020-08-21 DIAGNOSIS — O09213 Supervision of pregnancy with history of pre-term labor, third trimester: Secondary | ICD-10-CM

## 2020-08-21 DIAGNOSIS — O09899 Supervision of other high risk pregnancies, unspecified trimester: Secondary | ICD-10-CM | POA: Diagnosis not present

## 2020-08-21 DIAGNOSIS — G40909 Epilepsy, unspecified, not intractable, without status epilepticus: Secondary | ICD-10-CM

## 2020-08-29 ENCOUNTER — Encounter: Payer: Medicaid Other | Admitting: Certified Nurse Midwife

## 2020-09-20 ENCOUNTER — Other Ambulatory Visit (HOSPITAL_COMMUNITY)
Admission: RE | Admit: 2020-09-20 | Discharge: 2020-09-20 | Disposition: A | Discharge status: Home / Self Care | Payer: Medicaid Other | Source: Ambulatory Visit | Attending: Obstetrics and Gynecology | Admitting: Obstetrics and Gynecology

## 2020-09-20 ENCOUNTER — Ambulatory Visit (INDEPENDENT_AMBULATORY_CARE_PROVIDER_SITE_OTHER): Payer: Medicaid Other | Admitting: Obstetrics & Gynecology

## 2020-09-20 ENCOUNTER — Other Ambulatory Visit: Payer: Self-pay

## 2020-09-20 VITALS — BP 113/67 | HR 101 | Wt 137.0 lb

## 2020-09-20 DIAGNOSIS — O0990 Supervision of high risk pregnancy, unspecified, unspecified trimester: Secondary | ICD-10-CM

## 2020-09-20 DIAGNOSIS — O09899 Supervision of other high risk pregnancies, unspecified trimester: Secondary | ICD-10-CM

## 2020-09-20 NOTE — Patient Instructions (Signed)

## 2020-09-20 NOTE — Progress Notes (Signed)
   PRENATAL VISIT NOTE  Subjective:  Erin Price is a 29 y.o. [phone] at [redacted]w[redacted]d being seen today for ongoing prenatal care.  She is currently monitored for the following issues for this low-risk pregnancy and has Scoliosis; Migraine; Asthma, mild intermittent; Seizures (HCC); History of substance abuse (HCC); Uterine bleeding, dysfunctional; Supervision of high risk pregnancy, antepartum; History of preterm delivery, currently pregnant; and Unwanted fertility on their problem list.  Patient reports occasional contractions.  Contractions: Irritability. Vag. Bleeding: None.  Movement: Present. Denies leaking of fluid.   The following portions of the patient's history were reviewed and updated as appropriate: allergies, current medications, past family history, past medical history, past social history, past surgical history and problem list.   Objective:   Vitals:   09/20/20 0920  BP: 113/67  Pulse: (!) 101  Weight: 137 lb (62.1 kg)    Fetal Status: Fetal Heart Rate (bpm): 128 Fundal Height: 36 cm Movement: Present  Presentation: Vertex  General:  Alert, oriented and cooperative. Patient is in no acute distress.  Skin: Skin is warm and dry. No rash noted.   Cardiovascular: Normal heart rate noted  Respiratory: Normal respiratory effort, no problems with respiration noted  Abdomen: Soft, gravid, appropriate for gestational age.  Pain/Pressure: Present     Pelvic: Cervical exam performed in the presence of a chaperone Dilation: 3.5 Effacement (%): 60 Station: -3  Extremities: Normal range of motion.  Edema: None  Mental Status: Normal mood and affect. Normal behavior. Normal judgment and thought content.   Assessment and Plan:  Pregnancy: [id number] at [redacted]w[redacted]d 1. Supervision of high risk pregnancy, antepartum Routine labs - Culture, beta strep (group b only) - GC/Chlamydia probe amp ([place])not at [facility]  Preterm labor symptoms and general obstetric precautions including but not  limited to vaginal bleeding, contractions, leaking of fluid and fetal movement were reviewed in detail with the patient. Please refer to After Visit Summary for other counseling recommendations.   Return in about 1 week (around 09/27/2020).  No future appointments.  Scheryl Darter, MD

## 2020-09-21 LAB — GC/CHLAMYDIA PROBE AMP (~~LOC~~) NOT AT ARMC
Chlamydia: NEGATIVE
Comment: NEGATIVE
Comment: NORMAL
Neisseria Gonorrhea: NEGATIVE

## 2020-09-22 ENCOUNTER — Other Ambulatory Visit: Payer: Self-pay

## 2020-09-22 ENCOUNTER — Encounter (HOSPITAL_COMMUNITY): Payer: Self-pay | Admitting: Family Medicine

## 2020-09-22 ENCOUNTER — Inpatient Hospital Stay (HOSPITAL_COMMUNITY)
Admission: AD | Admit: 2020-09-22 | Discharge: 2020-09-22 | Disposition: A | Discharge status: Home / Self Care | Payer: Medicaid Other | Attending: Family Medicine | Admitting: Family Medicine

## 2020-09-22 DIAGNOSIS — Z3A36 36 weeks gestation of pregnancy: Secondary | ICD-10-CM | POA: Insufficient documentation

## 2020-09-22 DIAGNOSIS — O4703 False labor before 37 completed weeks of gestation, third trimester: Secondary | ICD-10-CM

## 2020-09-22 LAB — URINALYSIS, ROUTINE W REFLEX MICROSCOPIC
Bilirubin Urine: NEGATIVE
Glucose, UA: NEGATIVE mg/dL
Hgb urine dipstick: NEGATIVE
Ketones, ur: 5 mg/dL — AB
Nitrite: NEGATIVE
Protein, ur: 30 mg/dL — AB
Specific Gravity, Urine: 1.018 (ref 1.005–1.030)
WBC, UA: >50 WBC/hpf — ABNORMAL HIGH (ref 0–5)
pH: 7 (ref 5.0–8.0)

## 2020-09-22 MED ORDER — LACTATED RINGERS IV BOLUS
1000.0000 mL | Freq: Once | INTRAVENOUS | Status: AC
  Administered 2020-09-22: 1000 mL via INTRAVENOUS

## 2020-09-22 MED ORDER — CYCLOBENZAPRINE HCL 5 MG PO TABS
10.0000 mg | ORAL_TABLET | Freq: Once | ORAL | Status: AC
  Administered 2020-09-22: 10 mg via ORAL
  Filled 2020-09-22: qty 2

## 2020-09-22 MED ORDER — LACTATED RINGERS IV SOLN
Freq: Once | INTRAVENOUS | Status: DC
Start: 2020-09-22 — End: 2020-09-22

## 2020-09-22 NOTE — MAU Note (Signed)
Patient reports ctx all night, none now but is feeling pressure when she walks and lower back pain.  Had some brownies 2 nights ago that she didn't know had THC in them.  Started making herself vomit since then and has set herself into contractions that come and go and stomach pain. Denies any VB.  Endorses + FM.  Denies LOF.

## 2020-09-22 NOTE — MAU Provider Note (Signed)
History     CSN: [id number]  Arrival date and time: 09/22/20 1123   Event Date/Time   First Provider Initiated Contact with Patient 09/22/20 1203      Chief Complaint  Patient presents with  . Contractions   HPI Erin Price 29 y.o. [redacted]w[redacted]d Comes to MAU with lower abdominal pain when she is walking and low back pain.  Had vomiting yesterday but none today.  No vaginal bleeding or leaking.  Baby is moving well.  History of preterm labor and stopped 17p last week.  OB History    Gravida  4   Para  3   Term  2   Preterm  1   AB      Living  3     SAB      IAB      Ectopic      Multiple  0   Live Births  3        Obstetric Comments  G1: 37wks. 7lbs 4oz.         Past Medical History:  Diagnosis Date  . 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 Asthma   . Chlamydia   . Closed head injury   . Migraine   . Ovarian cyst   . Preterm labor   . Scoliosis   . Seizures (HCC)    last seizure May 2018  . Status epilepticus (HCC) 05/19/2015    Past Surgical History:  Procedure Laterality Date  . BACK SURGERY     screws placed for scoliosis    Family History  Problem Relation Age of Onset  . Hypertension Father   . Stroke Father   . Diabetes Maternal Grandmother   . Hypertension Maternal Grandmother   . Diabetes Paternal Grandmother   . Hypertension Paternal Grandmother   . Anesthesia problems Neg Hx     Social History   Tobacco Use  . Smoking status: Never Smoker  . Smokeless tobacco: Never Used  Vaping Use  . Vaping Use: Never used  Substance Use Topics  . Alcohol use: No  . Drug use: Not Currently    Types: Marijuana    Comment: last use 02/04/2020    Allergies: No Known Allergies  No medications prior to admission.    Review of Systems  Constitutional: Negative for fever.  Gastrointestinal: Positive for abdominal pain. Negative for diarrhea, nausea and vomiting.  Genitourinary:  Negative for dysuria, vaginal bleeding and vaginal discharge.   Physical Exam   Blood pressure 107/64, pulse 87, temperature 98.3 F (36.8 C), resp. rate 15, weight 61.4 kg, last menstrual period 01/10/2020, SpO2 100 %, unknown if currently breastfeeding.  Physical Exam Vitals and nursing note reviewed.  Constitutional:      Appearance: She is well-developed and well-nourished.  HENT:     Head: Normocephalic.  Eyes:     Extraocular Movements: EOM normal.  Abdominal:     Palpations: Abdomen is soft.     Tenderness: There is no abdominal tenderness. There is no guarding or rebound.     Comments: FHT 130 with moderate variability and 15x15 accels noted. Contractions irregular with uterine irritability.  No decelerations.  Category 1 strip.  Genitourinary:    Comments: RN cervical exam - 4 cm 70% Musculoskeletal:        General: Normal range of motion.     Cervical back: Neck supple.  Skin:    General: Skin is warm and dry.  Neurological:  Mental Status: She is alert and oriented to person, place, and time.  Psychiatric:        Mood and Affect: Mood and affect normal.     MAU Course  Procedures LABS Results for orders placed or performed during the hospital encounter of 09/22/20 (from the past 24 hour(s))  Urinalysis, Routine w reflex microscopic Urine, Clean Catch     Status: Abnormal   Collection Time: 09/22/20 11:56 AM  Result Value Ref Range   Color, Urine AMBER (A) YELLOW   APPearance CLOUDY (A) CLEAR   Specific Gravity, Urine 1.018 1.005 - 1.030   pH 7.0 5.0 - 8.0   Glucose, UA NEGATIVE NEGATIVE mg/dL   Hgb urine dipstick NEGATIVE NEGATIVE   Bilirubin Urine NEGATIVE NEGATIVE   Ketones, ur 5 (A) NEGATIVE mg/dL   Protein, ur 30 (A) NEGATIVE mg/dL   Nitrite NEGATIVE NEGATIVE   Leukocytes,Ua MODERATE (A) NEGATIVE   RBC / HPF 0-5 0 - 5 RBC/hpf   WBC, UA >50 (H) 0 - 5 WBC/hpf   Bacteria, UA FEW (A) NONE SEEN   Squamous Epithelial / LPF 0-5 0 - 5   Mucus PRESENT     Amorphous Crystal PRESENT      MDM Having lots of uterine irritability and some abd pain and back pain.  Will culture urine to check to make sure there is no UTI.  No cervical change made in one hour.  Will give a bag of LR and flexeril PO to see if pain abates and uterine irritablity stops.  1500 - Client feeling much better.  Pain is much improved.  Will discharge home for now.  Awaiting regular labor contractions.  Cervix has not changed during visit in MAU  Assessment and Plan  Threatened preterm labor  Plan Keep your appointments in the office - next on 09-27-20 Reviewed signs of labor.  Could be in labor at any time - return with regular contractions, vaginal bleeding, rupture of membranes or lack of fetal movement. Drink at least 8 8-oz glasses of water every day. Urine culture pending.  Jamoni Broadfoot L Emmersen Garraway 09/22/2020, 3:11 PM

## 2020-09-22 NOTE — Discharge Instructions (Signed)
Braxton Hicks Contractions °Contractions of the uterus can occur throughout pregnancy, but they are not always a sign that you are in labor. You may have practice contractions called Braxton Hicks contractions. These false labor contractions are sometimes confused with true labor. °What are Braxton Hicks contractions? °Braxton Hicks contractions are tightening movements that occur in the muscles of the uterus before labor. Unlike true labor contractions, these contractions do not result in opening (dilation) and thinning of the cervix. Toward the end of pregnancy (32-34 weeks), Braxton Hicks contractions can happen more often and may become stronger. These contractions are sometimes difficult to tell apart from true labor because they can be very uncomfortable. You should not feel embarrassed if you go to the hospital with false labor. °Sometimes, the only way to tell if you are in true labor is for your health care provider to look for changes in the cervix. The health care provider will do a physical exam and may monitor your contractions. If you are not in true labor, the exam should show that your cervix is not dilating and your water has not broken. °If there are no other health problems associated with your pregnancy, it is completely safe for you to be sent home with false labor. You may continue to have Braxton Hicks contractions until you go into true labor. °How to tell the difference between true labor and false labor °True labor °· Contractions last 30-70 seconds. °· Contractions become very regular. °· Discomfort is usually felt in the top of the uterus, and it spreads to the lower abdomen and low back. °· Contractions do not go away with walking. °· Contractions usually become more intense and increase in frequency. °· The cervix dilates and gets thinner. °False labor °· Contractions are usually shorter and not as strong as true labor contractions. °· Contractions are usually irregular. °· Contractions  are often felt in the front of the lower abdomen and in the groin. °· Contractions may go away when you walk around or change positions while lying down. °· Contractions get weaker and are shorter-lasting as time goes on. °· The cervix usually does not dilate or become thin. °Follow these instructions at home: ° °· Take over-the-counter and prescription medicines only as told by your health care provider. °· Keep up with your usual exercises and follow other instructions from your health care provider. °· Eat and drink lightly if you think you are going into labor. °· If Braxton Hicks contractions are making you uncomfortable: °? Change your position from lying down or resting to walking, or change from walking to resting. °? Sit and rest in a tub of warm water. °? Drink enough fluid to keep your urine pale yellow. Dehydration may cause these contractions. °? Do slow and deep breathing several times an hour. °· Keep all follow-up prenatal visits as told by your health care provider. This is important. °Contact a health care provider if: °· You have a fever. °· You have continuous pain in your abdomen. °Get help right away if: °· Your contractions become stronger, more regular, and closer together. °· You have fluid leaking or gushing from your vagina. °· You pass blood-tinged mucus (bloody show). °· You have bleeding from your vagina. °· You have low back pain that you never had before. °· You feel your baby’s head pushing down and causing pelvic pressure. °· Your baby is not moving inside you as much as it used to. °Summary °· Contractions that occur before labor are   called Braxton Hicks contractions, false labor, or practice contractions. °· Braxton Hicks contractions are usually shorter, weaker, farther apart, and less regular than true labor contractions. True labor contractions usually become progressively stronger and regular, and they become more frequent. °· Manage discomfort from Braxton Hicks contractions  by changing position, resting in a warm bath, drinking plenty of water, or practicing deep breathing. °This information is not intended to replace advice given to you by your health care provider. Make sure you discuss any questions you have with your health care provider. °Document Revised: 08/22/2017 Document Reviewed: 01/23/2017 °Elsevier Patient Education © 2020 [employer] Inc[employer] ° °

## 2020-09-23 LAB — CULTURE, OB URINE: Culture: 80000 — AB

## 2020-09-23 NOTE — L&D Delivery Note (Signed)
LABOR COURSE Patient was admitted for SOL. She was augmented with Pitocin and AROM.   Delivery Note Called to room and patient was C/C/+2-+3. Head delivered LOT. No nuchal cord present. Shoulder and body delivered in usual fashion. Total of two pushes. At 1609 a viable female was delivered via Vaginal, Spontaneous (Presentation:LOT; LOA).  Infant with spontaneous cry, placed on mother's abdomen, dried and stimulated. Cord clamped x 2 after two-minute delay, and cut by patient. Cord blood drawn. Placenta delivered spontaneously with gentle cord traction. Appears intact. Fundus firm with massage and Pitocin. Labia, perineum, vagina, and cervix inspected. Fundus firm, scant but consistent trickle, multiple small clots dislodged with firm fundal massage. Discussed management with patient, advised 1000 mcg Cytotec. Pt agreeable. 1000 mcg Cytotec placed per rectum by CNM. Negligible bleeding after administration, fundus firm throughout, VSS.   APGAR: 8,9; weight: 3360g.   Cord: 3VC with the following complications: none  Anesthesia: epidural Episiotomy: None Lacerations: Scant superficial perineal abrasion, hemostatic after pressure applied Est. Blood Loss (mL): 221  Mom to postpartum.  Baby to Couplet care / Skin to Skin.  Clayton Bibles, CNM 09/25/20 4:32 PM

## 2020-09-24 LAB — CULTURE, BETA STREP (GROUP B ONLY): Strep Gp B Culture: NEGATIVE

## 2020-09-25 ENCOUNTER — Encounter (HOSPITAL_COMMUNITY): Payer: Self-pay | Admitting: Obstetrics and Gynecology

## 2020-09-25 ENCOUNTER — Inpatient Hospital Stay (HOSPITAL_COMMUNITY): Payer: Medicaid Other | Admitting: Anesthesiology

## 2020-09-25 ENCOUNTER — Other Ambulatory Visit: Payer: Self-pay

## 2020-09-25 ENCOUNTER — Inpatient Hospital Stay (HOSPITAL_COMMUNITY)
Admission: AD | Admit: 2020-09-25 | Discharge: 2020-09-27 | DRG: 807 | Disposition: A | Discharge status: Home / Self Care | Payer: Medicaid Other | Attending: Obstetrics and Gynecology | Admitting: Obstetrics and Gynecology

## 2020-09-25 DIAGNOSIS — O9952 Diseases of the respiratory system complicating childbirth: Secondary | ICD-10-CM | POA: Diagnosis present

## 2020-09-25 DIAGNOSIS — J4520 Mild intermittent asthma, uncomplicated: Secondary | ICD-10-CM | POA: Diagnosis present

## 2020-09-25 DIAGNOSIS — M419 Scoliosis, unspecified: Secondary | ICD-10-CM | POA: Diagnosis present

## 2020-09-25 DIAGNOSIS — O09899 Supervision of other high risk pregnancies, unspecified trimester: Secondary | ICD-10-CM

## 2020-09-25 DIAGNOSIS — Z3A37 37 weeks gestation of pregnancy: Secondary | ICD-10-CM

## 2020-09-25 DIAGNOSIS — O26893 Other specified pregnancy related conditions, third trimester: Secondary | ICD-10-CM | POA: Diagnosis present

## 2020-09-25 DIAGNOSIS — O4202 Full-term premature rupture of membranes, onset of labor within 24 hours of rupture: Secondary | ICD-10-CM | POA: Diagnosis not present

## 2020-09-25 DIAGNOSIS — Z87898 Personal history of other specified conditions: Secondary | ICD-10-CM

## 2020-09-25 DIAGNOSIS — O99892 Other specified diseases and conditions complicating childbirth: Secondary | ICD-10-CM | POA: Diagnosis present

## 2020-09-25 DIAGNOSIS — Z20822 Contact with and (suspected) exposure to covid-19: Secondary | ICD-10-CM | POA: Diagnosis present

## 2020-09-25 DIAGNOSIS — Z3009 Encounter for other general counseling and advice on contraception: Secondary | ICD-10-CM | POA: Diagnosis present

## 2020-09-25 DIAGNOSIS — O0990 Supervision of high risk pregnancy, unspecified, unspecified trimester: Secondary | ICD-10-CM

## 2020-09-25 DIAGNOSIS — Z3493 Encounter for supervision of normal pregnancy, unspecified, third trimester: Secondary | ICD-10-CM

## 2020-09-25 DIAGNOSIS — R569 Unspecified convulsions: Secondary | ICD-10-CM

## 2020-09-25 HISTORY — DX: Encounter for supervision of normal pregnancy, unspecified, third trimester: Z34.93

## 2020-09-25 LAB — TYPE AND SCREEN
ABO/RH(D): O POS
Antibody Screen: NEGATIVE

## 2020-09-25 LAB — CBC
HCT: 31.1 % — ABNORMAL LOW (ref 36.0–46.0)
Hemoglobin: 10.3 g/dL — ABNORMAL LOW (ref 12.0–15.0)
MCH: 31 pg (ref 26.0–34.0)
MCHC: 33.1 g/dL (ref 30.0–36.0)
MCV: 93.7 fL (ref 80.0–100.0)
Platelets: 187 10*3/uL (ref 150–400)
RBC: 3.32 MIL/uL — ABNORMAL LOW (ref 3.87–5.11)
RDW: 12.7 % (ref 11.5–15.5)
WBC: 10.9 10*3/uL — ABNORMAL HIGH (ref 4.0–10.5)
nRBC: 0 % (ref 0.0–0.2)

## 2020-09-25 LAB — SYPHILIS: RPR W/REFLEX TO RPR TITER AND TREPONEMAL ANTIBODIES, TRADITIONAL SCREENING AND DIAGNOSIS ALGORITHM: RPR Ser Ql: NONREACTIVE

## 2020-09-25 LAB — RESP PANEL BY RT-PCR (FLU A&B, COVID) ARPGX2
Influenza A by PCR: NEGATIVE
Influenza B by PCR: NEGATIVE
SARS Coronavirus 2 by RT PCR: NEGATIVE

## 2020-09-25 MED ORDER — COCONUT OIL OIL: 1.0000 "application " | TOPICAL_OIL | Status: DC | PRN

## 2020-09-25 MED ORDER — PRENATAL MULTIVITAMIN CH
1.0000 | ORAL_TABLET | Freq: Every day | ORAL | Status: DC
  Administered 2020-09-26: 1 via ORAL
  Filled 2020-09-25: qty 1

## 2020-09-25 MED ORDER — OXYCODONE-ACETAMINOPHEN 5-325 MG PO TABS: 2.0000 | ORAL_TABLET | ORAL | Status: DC | PRN

## 2020-09-25 MED ORDER — BENZOCAINE-MENTHOL 20-0.5 % EX AERO
1.0000 "application " | INHALATION_SPRAY | CUTANEOUS | Status: DC | PRN
  Administered 2020-09-25: 1 via TOPICAL
  Filled 2020-09-25: qty 56

## 2020-09-25 MED ORDER — PHENYLEPHRINE 40 MCG/ML (10ML) SYRINGE FOR IV PUSH (FOR BLOOD PRESSURE SUPPORT)
80.0000 ug | PREFILLED_SYRINGE | INTRAVENOUS | Status: DC | PRN
  Filled 2020-09-25: qty 10

## 2020-09-25 MED ORDER — ONDANSETRON HCL 4 MG/2ML IJ SOLN: 4.0000 mg | Freq: Four times a day (QID) | INTRAMUSCULAR | Status: DC | PRN

## 2020-09-25 MED ORDER — DIPHENHYDRAMINE HCL 50 MG/ML IJ SOLN
12.5000 mg | INTRAMUSCULAR | Status: DC | PRN
  Administered 2020-09-25: 12.5 mg via INTRAVENOUS
  Filled 2020-09-25: qty 1

## 2020-09-25 MED ORDER — OXYTOCIN-SODIUM CHLORIDE 30-0.9 UT/500ML-% IV SOLN
1.0000 m[IU]/min | INTRAVENOUS | Status: DC
  Administered 2020-09-25: 2 m[IU]/min via INTRAVENOUS
  Filled 2020-09-25: qty 500

## 2020-09-25 MED ORDER — ONDANSETRON HCL 4 MG/2ML IJ SOLN: 4.0000 mg | INTRAMUSCULAR | Status: DC | PRN

## 2020-09-25 MED ORDER — OXYTOCIN-SODIUM CHLORIDE 30-0.9 UT/500ML-% IV SOLN: 2.5000 [IU]/h | INTRAVENOUS | Status: DC

## 2020-09-25 MED ORDER — DIPHENHYDRAMINE HCL 25 MG PO CAPS: 25.0000 mg | ORAL_CAPSULE | Freq: Four times a day (QID) | ORAL | Status: DC | PRN

## 2020-09-25 MED ORDER — LACTATED RINGERS IV SOLN: 500.0000 mL | INTRAVENOUS | Status: DC | PRN

## 2020-09-25 MED ORDER — DIBUCAINE (PERIANAL) 1 % EX OINT: 1.0000 "application " | TOPICAL_OINTMENT | CUTANEOUS | Status: DC | PRN

## 2020-09-25 MED ORDER — OXYTOCIN BOLUS FROM INFUSION
333.0000 mL | Freq: Once | INTRAVENOUS | Status: AC
  Administered 2020-09-25: 333 mL via INTRAVENOUS

## 2020-09-25 MED ORDER — OXYCODONE-ACETAMINOPHEN 5-325 MG PO TABS: 1.0000 | ORAL_TABLET | ORAL | Status: DC | PRN

## 2020-09-25 MED ORDER — EPHEDRINE 5 MG/ML INJ
10.0000 mg | INTRAVENOUS | Status: DC | PRN
  Filled 2020-09-25: qty 2

## 2020-09-25 MED ORDER — IBUPROFEN 600 MG PO TABS: 600.0000 mg | ORAL_TABLET | Freq: Once | ORAL | Status: DC

## 2020-09-25 MED ORDER — ACETAMINOPHEN 325 MG PO TABS: 650.0000 mg | ORAL_TABLET | ORAL | Status: DC | PRN

## 2020-09-25 MED ORDER — SOD CITRATE-CITRIC ACID 500-334 MG/5ML PO SOLN
30.0000 mL | ORAL | Status: DC | PRN
  Administered 2020-09-25: 30 mL via ORAL
  Filled 2020-09-25: qty 15

## 2020-09-25 MED ORDER — FERROUS SULFATE 325 (65 FE) MG PO TABS: 325.0000 mg | ORAL_TABLET | Freq: Two times a day (BID) | ORAL | Status: DC

## 2020-09-25 MED ORDER — ACETAMINOPHEN 325 MG PO TABS
650.0000 mg | ORAL_TABLET | ORAL | Status: DC | PRN
  Administered 2020-09-25: 650 mg via ORAL
  Filled 2020-09-25: qty 2

## 2020-09-25 MED ORDER — LACTATED RINGERS IV SOLN: 500.0000 mL | Freq: Once | INTRAVENOUS | Status: DC

## 2020-09-25 MED ORDER — SODIUM CHLORIDE (PF) 0.9 % IJ SOLN
INTRAMUSCULAR | Status: DC | PRN
  Administered 2020-09-25: 12 mL/h via EPIDURAL

## 2020-09-25 MED ORDER — MAGNESIUM HYDROXIDE 400 MG/5ML PO SUSP: 30.0000 mL | ORAL | Status: DC | PRN

## 2020-09-25 MED ORDER — WITCH HAZEL-GLYCERIN EX PADS: 1.0000 "application " | MEDICATED_PAD | CUTANEOUS | Status: DC | PRN

## 2020-09-25 MED ORDER — LIDOCAINE HCL (PF) 1 % IJ SOLN
30.0000 mL | INTRAMUSCULAR | Status: DC | PRN
  Filled 2020-09-25: qty 30

## 2020-09-25 MED ORDER — LIDOCAINE HCL (PF) 1 % IJ SOLN
INTRAMUSCULAR | Status: DC | PRN
  Administered 2020-09-25: 5 mL via EPIDURAL

## 2020-09-25 MED ORDER — FENTANYL-BUPIVACAINE-NACL 0.5-0.125-0.9 MG/250ML-% EP SOLN
12.0000 mL/h | EPIDURAL | Status: DC | PRN
  Filled 2020-09-25: qty 250

## 2020-09-25 MED ORDER — IBUPROFEN 600 MG PO TABS
600.0000 mg | ORAL_TABLET | Freq: Four times a day (QID) | ORAL | Status: DC
  Administered 2020-09-25 – 2020-09-27 (×6): 600 mg via ORAL
  Filled 2020-09-25 (×6): qty 1

## 2020-09-25 MED ORDER — LACTATED RINGERS IV SOLN: INTRAVENOUS | Status: DC

## 2020-09-25 MED ORDER — SIMETHICONE 80 MG PO CHEW: 80.0000 mg | CHEWABLE_TABLET | ORAL | Status: DC | PRN

## 2020-09-25 MED ORDER — MISOPROSTOL 200 MCG PO TABS
ORAL_TABLET | ORAL | Status: AC
  Filled 2020-09-25: qty 5

## 2020-09-25 MED ORDER — MISOPROSTOL 200 MCG PO TABS
1000.0000 ug | ORAL_TABLET | Freq: Once | ORAL | Status: AC
  Administered 2020-09-25: 1000 ug via RECTAL

## 2020-09-25 MED ORDER — FENTANYL CITRATE (PF) 100 MCG/2ML IJ SOLN: 100.0000 ug | INTRAMUSCULAR | Status: DC | PRN

## 2020-09-25 MED ORDER — ONDANSETRON HCL 4 MG PO TABS: 4.0000 mg | ORAL_TABLET | ORAL | Status: DC | PRN

## 2020-09-25 NOTE — Progress Notes (Addendum)
Labor Progress Note Erin [name] is a 30 y.o. [phone] at [redacted]w[redacted]d presented for spontaneous onset of labor. S: Patient doing well, and reported increased pressure to nurse. She was sleeping prior to [place] checking on her. . Cervix on our exam is 7cm, 90% effaced, and 0 station.   O:  BP (!) 86/74   Pulse 68   Temp 98.4 F (36.9 C) (Axillary)   Resp 16   Ht 5\' 1"  (1.549 m)   Wt 61.2 kg   LMP 01/10/2020 (Exact Date)   SpO2 98%   BMI 25.51 kg/m  EFM: baseline FHT 135/mod variability/accelerations present, no decelerations. Regular contractions about every 2 min   CVE: Dilation: 7.5 Effacement (%): 90 Cervical Position: Middle Station: Plus 1 Presentation: Vertex Exam by:: [email or web address] CNM  A&P: 30 y.o. 37 [redacted]w[redacted]d  #Labor: Amniotomy at about 9:45am, SVE as above. Pitocin started when contractions spaced out, currently at 68mU/min. #Pain: s/p epidural  #FWB: Category 1 strip  #GBS negative  #Fetal renal pyelectasis (now resolved): peds aware #H/o Seizures: no current medications. Continue to monitor #Scoliosis #Asthma: will avoid hemabate #H/o preterm delivery: makena in current pregnancy  4m, DO Center for [facility], [facility] 3:52 PM   Attestation of Supervision of Student:  I confirm that I have verified the information documented in the resident's note and that I have also personally performed the history, physical exam and all medical decision making activities.  I have verified that all services and findings are accurately documented in this student's note; and I agree with management and plan as outlined in the documentation. I have also made any necessary editorial changes.   [employer], MD Center for [facility], [facility] 09/25/2020 5:28 PM

## 2020-09-25 NOTE — MAU Note (Signed)
Pt c/o cx's every two minutes that started two hours ago.  Pt denies leaking of fluid.  +FM

## 2020-09-25 NOTE — Lactation Note (Signed)
This note was copied from a baby's chart. Lactation Consultation Note  Patient Name: Erin [name] NTIRW'E Date: 09/25/2020 Reason for consult: Follow-up assessment;Mother's request;Early term 27-38.6wks Age:30 hours   Mom experienced with breastfeeding with her last 2 children for 6 months and 1 year. Mom taught hand expression some drops of colostrum noted. Mom's nipples are erect with no signs of abrasion or redness.  Infant latched on the right in football but continues to pop off the breast. Mom used the manual pump for 5 minutes until colostrum leaking from the breast. Infant than latched in cross cradle showing signs of milk transfer with breast compression.   Visit shortened as Mom trying to reach the RN to collect the urine bag for testing.   LC set up her manual pump since she does not have one at home. LC reviewed pump parts, assembly, cleaning and storage.   Plan 1 To feed based on cues 8-12 x in 24 hour period no more than 4 hours without an attempt. Mom shown how to do hand expression and spoon feeding if unable to get infant to latch.           2. Mom has manual pump to use prepump before latching.           3. I and O sheet reviewed.            4. LC brochure of inpatient and outpatient services reviewed.   Mom to call [facility] in am to let them know infant was born for services.       Marly Schuld  Nicholson-Springer 09/25/2020, 9:14 PM

## 2020-09-25 NOTE — Discharge Instructions (Signed)

## 2020-09-25 NOTE — Anesthesia Preprocedure Evaluation (Signed)
Anesthesia Evaluation  Patient identified by MRN, date of birth, ID band Patient awake    Reviewed: Allergy & Precautions, NPO status , Patient's Chart, lab work & pertinent test results  Airway Mallampati: II  TM Distance: >3 FB Neck ROM: Full    Dental no notable dental hx. (+) Teeth Intact, Dental Advisory Given   Pulmonary asthma ,    Pulmonary exam normal breath sounds clear to auscultation       Cardiovascular Exercise Tolerance: Good negative cardio ROS Normal cardiovascular exam Rhythm:Regular Rate:Normal     Neuro/Psych Seizures -, Well Controlled,     GI/Hepatic Neg liver ROS,   Endo/Other  negative endocrine ROS  Renal/GU negative Renal ROS     Musculoskeletal Hx of scoliosis s/p surgery   Abdominal   Peds  Hematology Hgb 10.3 Plt 187   Anesthesia Other Findings   Reproductive/Obstetrics (+) Pregnancy                             Anesthesia Physical Anesthesia Plan  ASA: II  Anesthesia Plan: Epidural   Post-op Pain Management:    Induction:   PONV Risk Score and Plan:   Airway Management Planned:   Additional Equipment:   Intra-op Plan:   Post-operative Plan:   Informed Consent: I have reviewed the patients History and Physical, chart, labs and discussed the procedure including the risks, benefits and alternatives for the proposed anesthesia with the patient or authorized representative who has indicated his/her understanding and acceptance.       Plan Discussed with: CRNA and Anesthesiologist  Anesthesia Plan Comments: (37 wk G4P3 for LEA)        Anesthesia Quick Evaluation

## 2020-09-25 NOTE — Progress Notes (Addendum)
Labor Progress Note Erin [name] is a 30 y.o. [phone] at [redacted]w[redacted]d presented for spontaneous onset of labor. S: Patient is resting comfortably. She received her epidural at 8:33am. She reports still being able to feel her legs and just wanted to make sure pain was being adequately controlled. Nurse discussed PCA with patient and that if she needs more relief then we will have anesthesiologist come see her. Checked cervix and still 5cm. Ruptured membranes at patient's request which patient tolerated well.  O:  BP 123/81   Pulse 79   Temp 98.4 F (36.9 C) (Axillary)   Resp 16   Ht 5\' 1"  (1.549 m)   Wt 61.2 kg   LMP 01/10/2020 (Exact Date)   SpO2 98%   BMI 25.51 kg/m  EFM: baseline FHT 135/mod variability/accelerations present, no decelerations. Irregular contractions/uterine irritability  CVE: Dilation: 5 Effacement (%): 80 Station: -1 Presentation: Vertex Exam by:: Dr [email or web address]  A&P: 30 y.o. 37 [redacted]w[redacted]d  #Labor: Amniotomy at about 9:45am, SVE as above. Will augment with pitocin as clinically indicated if contractions do not increase. #Pain: s/p epidural  #FWB: Category 1 strip  #GBS negative  #Fetal renal pyelectasis (now resolved): peds aware #H/o Seizures: no current medications. Continue to monitor #Scoliosis #Asthma: will avoid hemabate #H/o preterm delivery: makena in current pregnancy  [redacted]w[redacted]d, DO Center for [facility], [facility] 10:04 AM  Attestation of Supervision of Student:  I confirm that I have verified the information documented in the resident's note and that I have also personally performed the history, physical exam and all medical decision making activities.  I have verified that all services and findings are accurately documented in this student's note; and I agree with management and plan as outlined in the documentation. I have also made any necessary editorial changes.  [facility], MD Center for [facility], [facility] 09/25/2020 10:53 AM

## 2020-09-25 NOTE — Progress Notes (Signed)
Erin [name] is a 30 y.o. [phone] at [redacted]w[redacted]d   Subjective: Reporting consistent perineal pressure, denies urge to push.  Objective: BP 116/67   Pulse 68   Temp 98.4 F (36.9 C) (Axillary)   Resp 16   Ht 5\' 1"  (1.549 m)   Wt 61.2 kg   LMP 01/10/2020 (Exact Date)   SpO2 98%   BMI 25.51 kg/m  No intake/output data recorded. Total I/O In: -  Out: 300 [Urine:300]  FHT:  FHR: 150 bpm, variability: moderate,  accelerations:  Present,  decelerations:  Absent UC:   regular, every 2-3 minutes SVE:   Dilation: 7.5 Effacement (%): 90 Station: Plus 1 Exam by:: [email or web address] CNM  Labs: Lab Results  Component Value Date   WBC 10.9 (H) 09/25/2020   HGB 10.3 (L) 09/25/2020   HCT 31.1 (L) 09/25/2020   MCV 93.7 09/25/2020   PLT 187 09/25/2020    Assessment / Plan: -Requested at bedside by RN due to new patient report of constant perineal pressure --SVE as above --Repositioned to throne --Discussed with patient fetal descent and position will intensify perineal pressure --Anticipate NSVD   11/23/2020, CNM 09/25/2020, 3:30 PM

## 2020-09-25 NOTE — Lactation Note (Signed)
This note was copied from a baby's chart. Lactation Consultation Note  Patient Name: Erin [name] [id number] Date: 09/25/2020 Reason for consult: L&D Initial assessment;Mother's request;Difficult latch;Early term 37-38.6wks Age:30 hours   LC arrived in the room to find infant latched on the left side in cradle shallow at the nipple. Helped Mom to switch to cross cradle but she stated it was uncomfortable and wanted to move to the opposite breasts.  LC then switched to football on the right breast. Infant latched well with lips flanged. LC instructed Mom to ensure the nose and cheeks are touching her breasts and how to give breast compression to increase let down.   Plan 1. To feed based on cues 8-12x in 24 hour period no more than 4 hours without an attempt.            2. Mom on the phone talking to the father of the baby and could not do any further education at this time.  Mom did tell the L and D nurse that if she decides to supplement with formula infant is on a Hilal diet and can only take Similac. I spoke to the L and D nurse and stated I would relay this information in the note since has an LC we do not work with formula selections.  Erin [name]  [name] 09/25/2020, 5:06 PM

## 2020-09-25 NOTE — Plan of Care (Signed)

## 2020-09-25 NOTE — Discharge Summary (Signed)
Postpartum Discharge Summary     Patient Name: Erin [name] DOB: 06-21-1991 MRN: [id number]  Date of admission: 09/25/2020 Delivery date:09/25/2020  Delivering provider: Mallie Snooks COCHRANE  Date of discharge: 09/27/2020  Admitting diagnosis: Supervision of low-risk pregnancy, third trimester [Z34.93] Intrauterine pregnancy: [redacted]w[redacted]d    Secondary diagnosis:  Active Problems:   Scoliosis   Asthma, mild intermittent   Seizures ([place]   History of preterm delivery, currently pregnant   Unwanted fertility   Supervision of low-risk pregnancy, third trimester   Vaginal delivery  Additional problems: as noted above    Discharge diagnosis: Term Pregnancy Delivered                                              Post partum procedures:N/A Augmentation: AROM and Pitocin Complications: None  Hospital course: Onset of Labor With Vaginal Delivery      30y.o. yo [id number] 364w0das admitted in Active Labor on 09/25/2020. Patient had an uncomplicated labor course as follows:  Membrane Rupture Time/Date: 9:37 AM ,09/25/2020   Delivery Method:Vaginal, Spontaneous  Episiotomy: None  Lacerations:  None  Patient had an uncomplicated postpartum course.  She is ambulating, tolerating a regular diet, passing flatus, and urinating well. Patient is discharged home in stable condition on 09/27/20.  Newborn Data: Birth date:09/25/2020  Birth time:4:09 PM  Gender:Female  Living status:Living  Apgars:8 ,9  Weight:3360 g   Magnesium Sulfate received: No BMZ received: No Rhophylac:N/A MMR:N/A T-DaP:Given prenatally Flu: Yes Transfusion:No  Physical exam  Vitals:   09/26/20 0429 09/26/20 0612 09/26/20 1718 09/26/20 2215  BP: (!) 107/54 107/75 106/69 103/71  Pulse: 72 61 65 92  Resp:  18  18  Temp: 98.4 F (36.9 C) 97.7 F (36.5 C) 98 F (36.7 C) 99 F (37.2 C)  TempSrc: Oral Oral  Oral  SpO2: 100% 100%    Weight:      Height:       General: alert, cooperative and no  distress Lochia: appropriate Uterine Fundus: firm Incision: N/A DVT Evaluation: No evidence of DVT seen on physical exam. No cords or calf tenderness. No significant calf/ankle edema. Labs: Lab Results  Component Value Date   WBC 10.9 (H) 09/25/2020   HGB 10.3 (L) 09/25/2020   HCT 31.1 (L) 09/25/2020   MCV 93.7 09/25/2020   PLT 187 09/25/2020   CMP Latest Ref Rng & Units 04/13/2017  Glucose 65 - 99 mg/dL 93  BUN 6 - 20 mg/dL 12  Creatinine 0.44 - 1.00 mg/dL 0.70  Sodium 135 - 145 mmol/L 137  Potassium 3.5 - 5.1 mmol/L 3.5  Chloride 101 - 111 mmol/L 103  CO2 22 - 32 mmol/L 25  Calcium 8.9 - 10.3 mg/dL 9.3  Total Protein 6.5 - 8.1 g/dL -  Total Bilirubin 0.3 - 1.2 mg/dL -  Alkaline Phos 38 - 126 U/L -  AST 15 - 41 U/L -  ALT 14 - 54 U/L -   Edinburgh Score: Edinburgh Postnatal Depression Scale Screening Tool 09/25/2020  I have been able to laugh and see the funny side of things. 0  I have looked forward with enjoyment to things. 0  I have blamed myself unnecessarily when things went wrong. 0  I have been anxious or worried for no good reason. 0  I have felt scared or panicky for no good reason.  0  Things have been getting on top of me. 0  I have been so unhappy that I have had difficulty sleeping. 0  I have felt sad or miserable. 0  I have been so unhappy that I have been crying. 0  The thought of harming myself has occurred to me. 0  Edinburgh Postnatal Depression Scale Total 0     After visit meds:  Allergies as of 09/27/2020   No Known Allergies     Medication List    STOP taking these medications   Blood Pressure Kit Devi   ferrous sulfate 325 (65 FE) MG tablet Commonly known as: FerrouSul     TAKE these medications   acetaminophen 325 MG tablet Commonly known as: Tylenol Take 2 tablets (650 mg total) by mouth every 6 (six) hours as needed for mild pain, moderate pain, fever or headache.   albuterol 108 (90 Base) MCG/ACT inhaler Commonly known as:  VENTOLIN HFA Inhale into the lungs.   coconut oil Oil Apply 1 application topically as needed (nipple pain).   ibuprofen 600 MG tablet Commonly known as: ADVIL Take 1 tablet (600 mg total) by mouth every 8 (eight) hours as needed for moderate pain or cramping.   multivitamin-prenatal 27-0.8 MG Tabs tablet Take 1 tablet by mouth daily at 12 noon.        Discharge home in stable condition Infant Feeding: Bottle and Breast Infant Disposition:home with mother Discharge instruction: per After Visit Summary and Postpartum booklet. Activity: Advance as tolerated. Pelvic rest for 6 weeks.  Diet: routine diet Future Appointments: Future Appointments  Date Time Provider [place]  10/24/2020  1:15 PM Marsala, Placido Sou, MD [facility]   Follow up Visit: Scheduling message sent by S. Jeronimo Greaves, [place] 09/25/2020  Please schedule this patient for a Virtual postpartum visit in 4 weeks with the following provider: Any provider. Additional Postpartum F/U: none High risk pregnancy complicated by: history of seizures Delivery mode:  Vaginal, Spontaneous  Anticipated Birth Control:  Plans Interval BTL  Randa Ngo, MD OB Fellow, Faculty Practice 09/27/2020 9:46 AM

## 2020-09-25 NOTE — H&P (Signed)
OBSTETRIC ADMISSION HISTORY AND PHYSICAL  Kevonna Nolte is a 30 y.o. female [phone] with IUP at 101w0dby L/7 presenting for spontaneous onset of labor. She reports +FMs, No LOF, no VB, no blurry vision, headaches or peripheral edema, and RUQ pain.  She plans on breast and bottle feeding. She requests postpartum BTL for birth control.  [facility] --->  Estimated Date of Delivery: 10/16/20  Sono:  _0 , CWD, normal anatomy, cephalic presentation, 22130Q 83% EFW   Prenatal History/Complications:  - fetal renal pyelectasis (resolved) - h/o seizures (no medications) - scoliosis - asthma (prn albuterol) - h/o preterm delivery (on Makena in current pregnancy)  Past Medical History: Past Medical History:  Diagnosis Date  . 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 Asthma   . Chlamydia   . Closed head injury   . Migraine   . Ovarian cyst   . Preterm labor   . Scoliosis   . Seizures ([place]    last seizure May 2018  . [place] 05/19[place]    Past Surgical History: Past Surgical History:  Procedure Laterality Date  . BACK SURGERY     screws placed for scoliosis    Obstetrical History: OB History    Gravida  4   Para  3   Term  2   Preterm  1   AB      Living  3     SAB      IAB      Ectopic      Multiple  0   Live Births  3        Obstetric Comments  G1: 37wks. 7lbs 4oz.         Social History Social History   Socioeconomic History  . Marital status: Single    Spouse name: Not on file  . Number of children: Not on file  . Years of education: Not on file  . Highest education level: Not on file  Occupational History  . Not on file  Tobacco Use  . Smoking status: Never Smoker  . Smokeless tobacco: Never Used  Vaping Use  . Vaping Use: Never used  Substance and Sexual Activity  . Alcohol use: No  . Drug use: Not Currently    Types: Marijuana     Comment: last use 02/04/2020  . Sexual activity: Yes    Birth control/protection: None  Other Topics Concern  . Not on file  Social History Narrative  . Not on file   Social Determinants of Health   Financial Resource Strain: Not on file  Food Insecurity: No Food Insecurity  . Worried About RCharity fundraiserin the Last Year: Never true  . Ran Out of Food in the Last Year: Never true  Transportation Needs: No Transportation Needs  . Lack of Transportation (Medical): No  . Lack of Transportation (Non-Medical): No  Physical Activity: Not on file  Stress: Not on file  Social Connections: Not on file    Family History: Family History  Problem Relation Age of Onset  . Hypertension Father   . Stroke Father   . Diabetes Maternal Grandmother   . Hypertension Maternal Grandmother   . Diabetes Paternal Grandmother   . Hypertension Paternal Grandmother   . Anesthesia problems Neg Hx     Allergies: No Known Allergies  Medications Prior to Admission  Medication Sig Dispense Refill Last  Dose  . albuterol (VENTOLIN HFA) 108 (90 Base) MCG/ACT inhaler Inhale into the lungs.  (Patient not taking: No sig reported)     . Blood Pressure Monitoring (BLOOD PRESSURE KIT) DEVI 1 Device by Does not apply route 3 times/day as needed-between meals & bedtime. 1 each 0   . ferrous sulfate (FERROUSUL) 325 (65 FE) MG tablet Take 1 tablet (325 mg total) by mouth 2 (two) times daily. 60 tablet 1   . Prenatal Vit-Fe Fumarate-FA (MULTIVITAMIN-PRENATAL) 27-0.8 MG TABS tablet Take 1 tablet by mouth daily at 12 noon. 30 tablet 11      Review of Systems   All systems reviewed and negative except as stated in HPI  Last menstrual period 01/10/2020, SpO2 100 %, unknown if currently breastfeeding. General appearance: alert, cooperative and appears stated age Lungs: normal WOB Heart: regular rate Abdomen: soft, non-tender Extremities: no sign of DVT Presentation: cephalic Fetal monitoringBaseline:  135 bpm, Variability: Good {> 6 bpm), Accelerations: Reactive and Decelerations: Absent Uterine activityFrequency: Every 1-2 minutes    Prenatal labs: ABO, Rh: O/Positive/-- (07/14 0926) Antibody: Negative (07/14 0926) Rubella: 1.41 (07/14 0926) RPR: Non Reactive (11/02 0837)  HBsAg: Negative (07/14 0926)  HIV: Non Reactive (11/02 0837)  GBS: Negative/-- (12/29 0945)  2 hr Glucola wnl Genetic screening  wnl Anatomy US wnl  Prenatal Transfer Tool  Maternal Diabetes: No Genetic Screening: Normal Maternal Ultrasounds/Referrals: Normal except for fetal renal pyelectasis (now resolved) Fetal Ultrasounds or other Referrals:  None Maternal Substance Abuse:  No Significant Maternal Medications:  Prn albuterol, makena Significant Maternal Lab Results: Group B Strep negative  No results found for this or any previous visit (from the past 24 hour(s)).  Patient Active Problem List   Diagnosis Date Noted  . Unwanted fertility 04/05/2020  . Supervision of high risk pregnancy, antepartum 03/29/2020  . History of preterm delivery, currently pregnant 03/29/2020  . Uterine bleeding, dysfunctional 05/04/2019  . History of substance abuse (HCC) 11/19/2017  . Scoliosis   . Migraine   . Asthma, mild intermittent   . Seizures (HCC)     Assessment/Plan:  Alexei Kroeze is a 29 y.o. G4P2103 at [redacted]w[redacted]d here for spontaneous onset of labor.  #Labor: Will manage expectantly and augment with pitocin as clinically indicated. #Pain: Pt desires epidural #FWB:  Category 1 strip #ID: GBS negative #MOF: breast and bottle #MOC: desires postpartum BTL (consent form signed 07/10/20) #Circ: desired #Fetal renal pyelectasis (now resolved): peds aware #H/o Seizures: no current medications. Continue to monitor #Scoliosis: ensure anesthesia aware given pt's desire for epidural #Asthma: will avoid hemabate #H/o preterm delivery: makena in current pregnancy  Goswick, Anna E, MD OB Fellow, Faculty  Practice 09/25/2020 6:33 AM    

## 2020-09-25 NOTE — Anesthesia Procedure Notes (Signed)
Epidural Patient location during procedure: OB Start time: 09/25/2020 8:33 AM End time: 09/25/2020 8:49 AM  Staffing Anesthesiologist: Trevor Iha, MD Performed: anesthesiologist   Preanesthetic Checklist Completed: patient identified, IV checked, site marked, risks and benefits discussed, surgical consent, monitors and equipment checked, pre-op evaluation and timeout performed  Epidural Patient position: sitting Prep: DuraPrep and site prepped and draped Patient monitoring: continuous pulse ox and blood pressure Approach: midline Location: L4-L5 Injection technique: LOR air  Needle:  Needle type: Tuohy  Needle gauge: 17 G Needle length: 9 cm and 9 Needle insertion depth: 7 cm Catheter type: closed end flexible Catheter size: 19 Gauge Catheter at skin depth: 12 cm Test dose: negative  Assessment Events: blood not aspirated, injection not painful, no injection resistance, no paresthesia and negative IV test  Additional Notes Patient identified. Risks/Benefits/Options discussed with patient including but not limited to bleeding, infection, nerve damage, paralysis, failed block, incomplete pain control, headache, blood pressure changes, nausea, vomiting, reactions to medication both or allergic, itching and postpartum back pain. Confirmed with bedside nurse the patient's most recent platelet count. Confirmed with patient that they are not currently taking any anticoagulation, have any bleeding history or any family history of bleeding disorders. Patient expressed understanding and wished to proceed. All questions were answered. Sterile technique was used throughout the entire procedure. Please see nursing notes for vital signs. Test dose was given through epidural needle and negative prior to continuing to dose epidural or start infusion. Warning signs of high block given to the patient including shortness of breath, tingling/numbness in hands, complete motor block, or any concerning  symptoms with instructions to call for help. Patient was given instructions on fall risk and not to get out of bed. All questions and concerns addressed with instructions to call with any issues. 1 Attempt (S) . Patient tolerated procedure well.

## 2020-09-26 NOTE — Lactation Note (Signed)
This note was copied from a baby's chart. Lactation Consultation Note  Patient Name: Erin Price [id number] Date: 09/26/2020 Reason for consult: Early term 37-38.6wks Age:31 hours  Baby Erin Erin Price asleep next to mom post circ.  Mom reports she has been breastfeeding much better.   IMom reprtos last time he ate was about three hours ago.  Asked mom if LC could assist with waking and feeding.  Mom agreed.  LC gently rocked him back and forth and he awoke.  LC assisted with latch.  Showed mom how to get him in chin first.  Infant latched easily and well.  LC reviewed breastfeeding basics with mom.  Mom is an experienced breastfeeder.Erin Price with some rhythmic sucking and intermittent swallows.  LC left mom and baby breastfeeding.  Mom requested LC get her some ice water before leaving. Urged mom to call lactation as needed.   Consult Status: Follow-up Date: 09/27/20 Follow-up type: In-patient    [facility] Michaelle Copas 09/26/2020, 4:24 PM

## 2020-09-26 NOTE — Clinical Social Work Maternal (Signed)
CLINICAL SOCIAL WORK MATERNAL/CHILD NOTE  Patient Details  Name: Erin Price MRN: [id number] Date of Birth: 1991/08/06  Date:  09/26/2020  Clinical Social Worker Initiating Note:  Montel Clock Kynsley Whitehouse LCSW Date/Time: Initiated:  09/26/20/0920     Child's Name:  Layne Benton   Biological Parents:  Mother,Father (Emmajo Grafton, [place])   Need for Interpreter:  None   Reason for Referral:  Current Substance Use/Substance Use During Pregnancy    Address:  [address] [place] [address]    Phone number:  [phone] (home)     Additional phone number: none   Household Members/Support Persons (HM/SP):   Household Member/Support Person 1   HM/SP Name Relationship DOB or Age  HM/SP -1  Rosene Pilling  MOB  04/10/1991  HM/SP -2 Knute Neu  Daughter  06/05/18  HM/SP -3        HM/SP -4        HM/SP -5        HM/SP -6        HM/SP -7        HM/SP -8          Natural Supports (not living in the home):  Parent,Spouse/significant other   Professional Supports: None   Employment:   not reported.   Type of Work: unknown   Education:  Some Materials engineer arranged:  n/a  Surveyor, quantity Resources:  Medicaid   Other Resources:  Sales executive ,WIC   Cultural/Religious Considerations Which May Impact Care:  none reported.   Strengths:  Ability to meet basic needs ,Compliance with medical plan ,Home prepared for child ,Pediatrician chosen   Psychotropic Medications:   none reported.       Pediatrician:    [place]  Pediatrician List:   Etta Grandchild [facility])  [facility]      Pediatrician Fax Number:    Risk Factors/Current Problems:  None   Cognitive State:  Alert ,Insightful ,Able to Concentrate    Mood/Affect:  Interested ,Happy ,Relaxed ,Comfortable ,Calm    CSW Assessment: CSW consulted due to [facility] reporting  THC use in pregnancy. CSW went to speak with MOB at bedside to address further needs.   CSW congratulated MOB on the birth of infant. CSW advised MOB of CSW's role and the reason for CSW coming to speak with her. MOB expressed that she used THC prior to confirming that she was pregnant. MOB expressed that once she found out that she was pregnant she stopped. MOB expressed that she went to a Christmas party recently and "I ate 2 brownies that I later found out had THC in them". MOB expressed that she immediately caused herself to vomit, however MOB expressed that she was unable to stop once she started. MOB expressed that she then presented to [facility] for help. CSW understanding and advised MOB oftem hospital drug screen policy. MOB was advised that infants UDS was negative however CSW would need to monitor infants CDS and make CPS report if warranted. MOB expressed that she understood and expressed that she does have CPS hx from 2 months ago. MOB expressed that FOB's girlfriend called CPS on her with the report that "I used cocaine, was going out partying while pregnant and leaving my child at home, and other stuff". MOB expressed that none of this was true and that the case was  closed. CSW followed up with [facility] CPS worker T. Aundria Rud and was advised that case was closed as of 08/11/20 per CPS.   CSW inquired from MOB on her mental health hx in which MOB expressed that she has no known mental health hx. MOB expressed that she has WIC and [employer] and expressed that she has all needed items to care for infant at this time. MOB expressed that she has support from her mom and FOB at this time. MOB expressed that she has no other needs.  CSW provided MOB with PPD and SIDS education. MOB Was given PPD Checklist in order to keep track of feelings as they may relate to PPD. MOB thanked CSW and expressed no other needs.   CSW will continue to monitor infants CDS and make CPS reports if warranted. No  barrier's to discharge.   CSW Plan/Description:  No Further Intervention Required/No Barriers to Discharge,Sudden Infant Death Syndrome (SIDS) Education,Perinatal Mood and Anxiety Disorder (PMADs) Education,Hospital Drug Screen Policy Information,CSW Will Continue to Monitor Umbilical Cord Tissue Drug Screen Results and Make Report if Celso Sickle, LCSWA 09/26/2020, 9:45 AM

## 2020-09-26 NOTE — Anesthesia Postprocedure Evaluation (Signed)
Anesthesia Post Note  Patient: [name]  Procedure(s) Performed: AN AD HOC LABOR EPIDURAL     Patient location during evaluation: Mother Baby Anesthesia Type: Epidural Level of consciousness: awake, awake and alert and oriented Pain management: pain level controlled Vital Signs Assessment: post-procedure vital signs reviewed and stable Respiratory status: spontaneous breathing, nonlabored ventilation and respiratory function stable Cardiovascular status: stable Postop Assessment: no headache, patient able to bend at knees, no apparent nausea or vomiting, adequate PO intake, able to ambulate and no backache Anesthetic complications: no   No complications documented.  Last Vitals:  Vitals:   09/26/20 0429 09/26/20 0612  BP: (!) 107/54 107/75  Pulse: 72 61  Resp:  18  Temp: 36.9 C 36.5 C  SpO2: 100% 100%    Last Pain:  Vitals:   09/26/20 0821  TempSrc:   PainSc: 0-No pain   Pain Goal: Patients Stated Pain Goal: 2 (09/26/20 0422)              Epidural/Spinal Function Cutaneous sensation: Normal sensation (09/26/20 0821), Patient able to flex knees: Yes (09/26/20 1610), Patient able to lift hips off bed: Yes (09/26/20 0821), Back pain beyond tenderness at insertion site: No (09/26/20 0821), Progressively worsening motor and/or sensory loss: No (09/26/20 0821), Bowel and/or bladder incontinence post epidural: No (09/26/20 0821)  Erin Price

## 2020-09-27 ENCOUNTER — Encounter: Payer: Medicaid Other | Admitting: Obstetrics & Gynecology

## 2020-09-27 MED ORDER — ACETAMINOPHEN 325 MG PO TABS: 650.0000 mg | ORAL_TABLET | Freq: Four times a day (QID) | ORAL | Status: DC | PRN

## 2020-09-27 MED ORDER — IBUPROFEN 600 MG PO TABS: 600.0000 mg | ORAL_TABLET | Freq: Three times a day (TID) | ORAL | 0 refills | Status: DC | PRN

## 2020-09-27 MED ORDER — COCONUT OIL OIL: 1.0000 "application " | TOPICAL_OIL | 0 refills | Status: DC | PRN

## 2020-09-27 NOTE — Lactation Note (Signed)
This note was copied from a baby's chart. Lactation Consultation Note  Patient Name: Boy Rika Daughdrill [id number] Date: 09/27/2020 Reason for consult: Follow-up assessment Age:30 hours   P4 mother whose infant is now 66 hours old.  This is an ETI at 37+0 weeks.  Mother has breast feeding experience with her other children.  Baby was just completing a 20 minutes feeding when I arrived.  Mother was happy to report that her milk "is in" and her breasts are fuller today.  Vein distension noted on her breasts.  She can easily express milk and baby is content after most of his feedings. LATCH scores have been 8-10.  Mother has been supplementing with Similac 20 calorie formula.    Engorgement prevention/treatment discussed.  Mother has a manual pump and a DEBP for home use.  She has our OP phone number for any further questions/concerns after discharge.  No support person present at this time.     Maternal Data    Feeding Feeding Type: Breast Fed  LATCH Score Latch: Grasps breast easily, tongue down, lips flanged, rhythmical sucking.  Audible Swallowing: Spontaneous and intermittent  Type of Nipple: Everted at rest and after stimulation  Comfort (Breast/Nipple): Soft / non-tender  Hold (Positioning): No assistance needed to correctly position infant at breast.  LATCH Score: 10  Interventions    Lactation Tools Discussed/Used     Consult Status Consult Status: Complete Date: 09/27/20 Follow-up type: Call as needed    Tammy Wickliffe R Sahily Biddle 09/27/2020, 11:31 AM

## 2020-10-12 ENCOUNTER — Telehealth: Payer: Self-pay | Admitting: Family Medicine

## 2020-10-12 NOTE — Telephone Encounter (Signed)
Breastfeeding need to know if she can get the COVID injection.

## 2020-10-13 NOTE — Telephone Encounter (Signed)
Called pt and left VM stating that she may receive the Covid vaccine while Breastfeeding.

## 2020-10-24 ENCOUNTER — Telehealth: Payer: Medicaid Other | Admitting: Obstetrics and Gynecology

## 2020-10-25 ENCOUNTER — Telehealth (INDEPENDENT_AMBULATORY_CARE_PROVIDER_SITE_OTHER): Payer: Medicaid Other | Admitting: Obstetrics and Gynecology

## 2020-10-25 ENCOUNTER — Encounter: Payer: Self-pay | Admitting: Obstetrics and Gynecology

## 2020-10-25 DIAGNOSIS — Z8759 Personal history of other complications of pregnancy, childbirth and the puerperium: Secondary | ICD-10-CM

## 2020-10-25 MED ORDER — NORETHINDRONE 0.35 MG PO TABS: 1.0000 | ORAL_TABLET | Freq: Every day | ORAL | 11 refills | Status: DC

## 2020-10-25 NOTE — Progress Notes (Signed)
   I connected with@ on 10/25/20 at  1:15 PM EST by: Mychart video and verified that I am speaking with the correct person using two identifiers.  Patient is located at home and provider is located at [employer] for [employer] [facility] [employer] [facility] .     The purpose of this virtual visit is to provide medical care while limiting exposure to the novel coronavirus. I discussed the limitations, risks, security and privacy concerns of performing an evaluation and management service by video and the availability of in person appointments. I also discussed with the patient that there may be a patient responsible charge related to this service. By engaging in this virtual visit, you consent to the provision of healthcare.  Additionally, you authorize for your insurance to be billed for the services provided during this visit.  The patient expressed understanding and agreed to proceed.  The following staff members participated in the virtual visit:  Emmie Niemann and Myriam Jacobson MD   Post Partum Visit Note Subjective:   Erin Price is a 30 y.o. [phone] female being evaluated for postpartum followup.  She is 4 weeks postpartum following a normal spontaneous vaginal delivery at  37 gestational weeks.  I have fully reviewed the prenatal and intrapartum course; pregnancy complicated by hx of preterm delivery, hx of seizures (not on medications), asthma.  Postpartum course has been uncomplicated. Baby is doing well. Baby is feeding by both breast and bottle - Gerber Soy. Bleeding no bleeding. Bowel function is normal. Bladder function is normal. Patient is not sexually active. Contraception method is none. Patient is interested in BTL and OCPs. Postpartum depression screening: negative.   The pregnancy intention screening data noted above was reviewed. Potential methods of contraception were discussed. The patient elected to proceed with POP's while awaiting BTL.  The following portions of the patient's history  were reviewed and updated as appropriate: allergies, current medications, past family history, past medical history, past social history, past surgical history and problem list.  Review of Systems Pertinent items are noted in HPI.   Objective:  There were no vitals filed for this visit. Self-Obtained       Assessment:    Normal postpartum exam.  Plan:  Essential components of care per ACOG recommendations:  1.  Mood and well being: Patient with negative depression screening today. Reviewed local resources for support.  - Patient does not use tobacco.  - hx of drug use? No     2. Infant care and feeding:  -Patient currently breastmilk feeding? Yes  Reviewed importance of draining breast regularly to support lactation. -Social determinants of health (SDOH) reviewed in EPIC. No concerns  3. Sexuality, contraception and birth spacing - Patient does not want a pregnancy in the next year.   - Reviewed forms of contraception in tiered fashion. Patient will use POP's until her BTL.  - Discussed birth spacing of 18 months  4. Sleep and fatigue -Encouraged family/partner/community support of 4 hrs of uninterrupted sleep to help with mood and fatigue  5. Physical Recovery  - Discussed patients delivery and complications - Patient has urinary incontinence? No   - Patient is safe to resume physical and sexual activity  6.  Health Maintenance - Last pap smear done 2021 and was normal with negative HPV.      Casper Harrison, MD [facility] Family Medicine Fellow, [facility] for [facility], [facility]

## 2020-11-03 ENCOUNTER — Telehealth: Payer: Self-pay | Admitting: Obstetrics and Gynecology

## 2020-11-03 ENCOUNTER — Ambulatory Visit: Payer: Medicaid Other | Admitting: Obstetrics and Gynecology

## 2020-11-03 NOTE — Telephone Encounter (Signed)
I called pt to resch her missed appt today. I resch pt to 11-22-20 at 3:55pm . I left a voicemail with pt and sent mychart message with appt time/date

## 2020-11-07 ENCOUNTER — Ambulatory Visit (HOSPITAL_BASED_OUTPATIENT_CLINIC_OR_DEPARTMENT_OTHER)
Admission: RE | Admit: 2020-11-07 | Payer: Medicaid Other | Source: Home / Self Care | Admitting: Obstetrics and Gynecology

## 2020-11-07 ENCOUNTER — Encounter (HOSPITAL_BASED_OUTPATIENT_CLINIC_OR_DEPARTMENT_OTHER): Admission: RE | Payer: Self-pay | Source: Home / Self Care

## 2020-11-07 SURGERY — LIGATION, FALLOPIAN TUBE, LAPAROSCOPIC
Anesthesia: Choice | Laterality: Bilateral

## 2020-11-22 ENCOUNTER — Ambulatory Visit (INDEPENDENT_AMBULATORY_CARE_PROVIDER_SITE_OTHER): Payer: Medicaid Other | Admitting: Obstetrics and Gynecology

## 2020-11-22 ENCOUNTER — Encounter: Payer: Self-pay | Admitting: Obstetrics and Gynecology

## 2020-11-22 ENCOUNTER — Other Ambulatory Visit: Payer: Self-pay

## 2020-11-22 VITALS — Wt 122.5 lb

## 2020-11-22 DIAGNOSIS — Z308 Encounter for other contraceptive management: Secondary | ICD-10-CM | POA: Diagnosis not present

## 2020-11-22 DIAGNOSIS — Z3009 Encounter for other general counseling and advice on contraception: Secondary | ICD-10-CM

## 2020-11-22 NOTE — Progress Notes (Signed)
CC: Tubal ligation consultation    Patient desires permanent sterilization.  Other reversible forms of contraception (over the counter/barrier methods; hormonal contraceptives including pill, patch, ring, Depo-Provera injection, Nexplanon implant; hormonal IUDs Skyla and Mirena; nonhormonal copper IUD Paragard) were discussed with patient; she declined all these modalities. Also discussed the option of vasectomy for her female partner; she also declined this option. She was given the choice between laparoscopic bilateral tubal sterilization using Filshie clips or laparoscopic bilateral salpingectomy. For the Filshie clip sterilization, she was told she will have one incision in her umbilicus.  Failure risk of 1-2 % with increased risk of ectopic gestation if pregnancy occurs was also discussed with patient. For the bilateral salpingectomy, she was told that both tubes will be resected via three small incisions; the failure risk of less than 1%.  Any future pregnancies will have to be attempted via IVF or other fertility procedures.  Reiterated permanence and irreversibility of both procedures; in the case of Filshie clip application, attempts to reverse tubal sterilization are often not successful.  Also emphasized risk of regret which is noted more in patients less than the age of 51.  All questions were answered. She desires laparoscopic bilateral salpingectomy.  Other risks of the procedure were discussed with patient including but not limited to: bleeding, infection, injury to surrounding organs and need for additional procedures.  Also discussed possibility of post-tubal pain syndrome. Patient verbalized understanding of these risks and wants to proceed with this procedure.  She was told that she will be contacted by our surgical scheduler regarding the time and date of her surgery; routine preoperative instructions of having nothing to eat or drink after midnight on the day prior to surgery and also  coming to the hospital 1.5 hours prior to her time of surgery were also emphasized.  She was told she may be called for a preoperative appointment about a week prior to surgery and will be given further preoperative instructions at that visit. Printed patient education handouts about the procedure were given to the patient to review at home. Medicaid papers were signed today.  I spent 10 minutes dedicated to the care of this patient including previsit review of records, face to face time with the patient discussing the procedure and post visit testing.  Pt was given take home information for the procedure.   Erin Aloe, MD, FACOG Obstetrician & Gynecologist, [facility] for [facility], [facility]

## 2020-11-22 NOTE — Progress Notes (Signed)
BTL consent signed  Erin Price  11/22/20

## 2020-11-22 NOTE — Patient Instructions (Signed)
Laparoscopic Tubal Ligation Laparoscopic tubal ligation is a procedure to close the fallopian tubes. This is done to prevent pregnancy. When the fallopian tubes are closed, the eggs that your ovaries release cannot enter the uterus, and sperm cannot reach the released eggs. You should not have this procedure if you want to get pregnant someday or if you are unsure about having more children. Tell a health care provider about:  Any allergies you have.  All medicines you are taking, including vitamins, herbs, eye drops, creams, and over-the-counter medicines.  Any problems you or family members have had with anesthetic medicines.  Any blood disorders you have.  Any surgeries you have had.  Any medical conditions you have.  Whether you are pregnant or may be pregnant.  Any past pregnancies. What are the risks? Generally, this is a safe procedure. However, problems may occur, including:  Infection.  Bleeding.  Injury to other organs in the abdomen.  Side effects from anesthetic medicines.  Failure of the procedure. This procedure can increase your risk of an ectopic pregnancy. This is a pregnancy in which a fertilized egg attaches to the outside of the uterus. What happens before the procedure? Staying hydrated Follow instructions from your health care provider about hydration, which may include:  Up to 2 hours before the procedure - you may continue to drink clear liquids, such as water, clear fruit juice, black coffee, and plain tea. Eating and drinking restrictions Follow instructions from your health care provider about eating and drinking, which may include:  8 hours before the procedure - stop eating heavy meals or foods, such as meat, fried foods, or fatty foods.  6 hours before the procedure - stop eating light meals or foods, such as toast or cereal.  6 hours before the procedure - stop drinking milk or drinks that contain milk.  2 hours before the procedure - stop  drinking clear liquids. Medicines Ask your health care provider about:  Changing or stopping your regular medicines. This is especially important if you are taking diabetes medicines or blood thinners.  Taking medicines such as aspirin and ibuprofen. These medicines can thin your blood. Do not take these medicines unless your health care provider tells you to take them.  Taking over-the-counter medicines, vitamins, herbs, and supplements. Surgery safety Ask your health care provider:  How your surgery site will be marked.  What steps will be taken to help prevent infection. These steps may include: ? Removing hair at the surgery site. ? Washing skin with a germ-killing soap. ? Taking antibiotic medicine. General instructions  Do not use any products that contain nicotine or tobacco for at least 4 weeks before the procedure. These products include cigarettes, chewing tobacco, and vaping devices, such as e-cigarettes. If you need help quitting, ask your health care provider.  Plan to have someone take you home from the hospital.  If you will be going home right after the procedure, plan to have a responsible adult care for you for the time you are told. This is important. What happens during the procedure?  An IV will be inserted into one of your veins.  You will be given one or more of the following: ? A medicine to help you relax (sedative). ? A medicine to numb the area (local anesthetic). ? A medicine to make you fall asleep (general anesthetic). ? A medicine that is injected into an area of your body to numb everything below the injection site (regional anesthetic).  Your bladder   may be emptied with a small tube (catheter).  If you have been given a general anesthetic, a tube will be put down your throat to help you breathe.  Two small incisions will be made in your lower abdomen and near your belly button.  Your abdomen will be inflated with a gas. This will let the  surgeon see better and will give the surgeon room to work.  A lighted tube with camera (laparoscope) will be inserted into your abdomen through one of the incisions. Small instruments will be inserted through the other incision.  The fallopian tubes will be tied off, burned (cauterized), or blocked with a clip, ring, or clamp. A small portion in the center of each fallopian tube may be removed.  The gas will be released from the abdomen.  The incisions will be closed with stitches (sutures).  A bandage (dressing) will be placed over the incisions. The procedure may vary among health care providers and hospitals.      What happens after the procedure?  Your blood pressure, heart rate, breathing rate, and blood oxygen level will be monitored until you leave the hospital.  You will be given medicine to help with pain, nausea, and vomiting as needed.  You may have vaginal discharge after the procedure. You may need to wear a sanitary napkin.  If you were given a sedative during the procedure, it can affect you for several hours. Do not drive or operate machinery until your health care provider says that it is safe. Summary  Laparoscopic tubal ligation is a procedure that is done to prevent pregnancy.  You should not have this procedure if you want to get pregnant someday or if you are unsure about having more children.  The procedure is done using a thin, lighted tube (laparoscope) with a camera attached that will be inserted into your abdomen through an incision.  After the procedure you will be given medicine to help with pain, nausea, and vomiting as needed.  Plan to have someone take you home from the hospital. This information is not intended to replace advice given to you by your health care provider. Make sure you discuss any questions you have with your health care provider. Document Revised: 05/26/2020 Document Reviewed: 05/26/2020 Elsevier Patient Education  2021 Elsevier  Inc. Salpingectomy Salpingectomy, also called tubectomy, is the surgical removal of one of the fallopian tubes. The fallopian tubes are where eggs travel from the ovaries to the uterus. Removing one fallopian tube does not prevent you from becoming pregnant. It also does not cause problems with your menstrual periods. You may need this procedure if you:  Have a fertilized egg that attaches to the fallopian tube (ectopic pregnancy), especially one that causes the tube to burst or tear (rupture).  Have an infected fallopian tube.  Have cancer of the fallopian tube or nearby organs.  Have had an ovary removed due to a cyst or tumor.  Have had your uterus removed.  Are at high risk for ovarian cancer. There are three different methods that can be used for a salpingectomy:  An open method that involves making one large incision in your abdomen.  A laparoscopic method that involves using a thin, lighted tube with a tiny camera on the end (laparoscope) to help perform the procedure. The laparoscope will allow your surgeon to make several small incisions in the abdomen instead of one large incision.  A robot-assisted method that involves using a computer to control surgical instruments that are   attached to robotic arms. Tell a health care provider about:  Any allergies you have.  All medicines you are taking, including vitamins, herbs, eye drops, creams, and over-the-counter medicines.  Any problems you or family members have had with anesthetic medicines.  Any blood disorders you have.  Any surgeries you have had.  Any medical conditions you have.  Whether you are pregnant or may be pregnant. What are the risks? Generally, this is a safe procedure. However, problems may occur, including:  Infection.  Bleeding.  Allergic reactions to medicines.  Blood clots in the legs or lungs.  Damage to other structures or organs. What happens before the procedure? Medicines  Ask your  health care provider about: ? Changing or stopping your regular medicines. This is especially important if you are taking diabetes medicines or blood thinners. ? Taking medicines such as aspirin and ibuprofen. These medicines can thin your blood. Do not take these medicines unless your health care provider tells you to take them. ? Taking over-the-counter medicines, vitamins, herbs, and supplements. Staying hydrated Follow instructions from your health care provider about hydration, which may include:  Up to 2 hours before the procedure - you may continue to drink clear liquids, such as water, clear fruit juice, black coffee, and plain tea. Eating and drinking restrictions Follow instructions from your health care provider about eating and drinking, which may include:  8 hours before the procedure - stop eating heavy meals or foods, such as meat, fried foods, or fatty foods.  6 hours before the procedure - stop eating light meals or foods, such as toast or cereal.  6 hours before the procedure - stop drinking milk or drinks that contain milk.  2 hours before the procedure - stop drinking clear liquids. General instructions  Do not use any products that contain nicotine or tobacco for at least 4 weeks before the procedure. These products include cigarettes, e-cigarettes, and chewing tobacco. If you need help quitting, ask your health care provider.  You may have an exam or tests, such as: ? An electrocardiogram (ECG). ? A blood or urine test.  Ask your health care provider what steps will be taken to help prevent infection. These may include: ? Removing hair at the surgery site. ? Washing skin with a germ-killing soap. ? Taking antibiotic medicine.  Plan to have someone take you home from the hospital or clinic.  If you will be going home right after the procedure, plan to have someone with you for 24 hours. What happens during the procedure?  An IV will be inserted into one of  your veins.  You will be given one or more of the following: ? A medicine to help you relax (sedative). ? A medicine to make you fall asleep (general anesthetic).  A thin tube (catheter) may be inserted through your urethra and into your bladder to drain urine during your procedure.  Depending on the type of procedure you are having, one incision or several small incisions will be made in your abdomen.  Your fallopian tube will be cut and removed from where it attaches to your uterus.  Your blood vessels will be clamped and tied to prevent excess bleeding.  The incisions in your abdomen will be closed with stitches (sutures), staples, or skin glue.  A bandage (dressing) may be placed over your incisions. The procedure may vary among health care providers and hospitals. What happens after the procedure?  Your blood pressure, heart rate, breathing rate, and blood   oxygen level will be monitored until you leave the hospital.  You may continue to receive fluids and medicines through an IV.  You may continue to have a catheter draining your urine.  You may have to wear compression stockings. These stockings help to prevent blood clots and reduce swelling in your legs.  You will be given pain medicine as needed.  Do not drive for 24 hours if you were given a sedative during your procedure.   Summary  Salpingectomy is a surgical procedure to remove one of the fallopian tubes.  The procedure may be done with an open incision, a laparoscope, or computer-controlled instruments.  Depending on the type of procedure you are having, one incision or several small incisions will be made in your abdomen.  Your blood pressure, heart rate, breathing rate, and blood oxygen level will be monitored until you leave the hospital.  Plan to have someone take you home from the hospital or clinic. This information is not intended to replace advice given to you by your health care provider. Make sure you  discuss any questions you have with your health care provider. Document Revised: 08/31/2018 Document Reviewed: 08/31/2018 Elsevier Patient Education  2021 Elsevier Inc.  

## 2020-11-23 ENCOUNTER — Encounter: Payer: Self-pay | Admitting: *Deleted

## 2020-12-11 ENCOUNTER — Other Ambulatory Visit (INDEPENDENT_AMBULATORY_CARE_PROVIDER_SITE_OTHER): Payer: Medicaid Other | Admitting: Obstetrics and Gynecology

## 2020-12-11 NOTE — Progress Notes (Signed)
preop orders placed

## 2021-01-04 ENCOUNTER — Ambulatory Visit: Payer: Medicaid Other | Admitting: Obstetrics and Gynecology

## 2021-01-08 ENCOUNTER — Other Ambulatory Visit (HOSPITAL_COMMUNITY): Payer: Medicaid Other

## 2021-01-10 ENCOUNTER — Encounter (HOSPITAL_BASED_OUTPATIENT_CLINIC_OR_DEPARTMENT_OTHER): Admission: RE | Payer: Self-pay | Source: Home / Self Care

## 2021-01-10 ENCOUNTER — Ambulatory Visit (HOSPITAL_BASED_OUTPATIENT_CLINIC_OR_DEPARTMENT_OTHER)
Admission: RE | Admit: 2021-01-10 | Payer: Medicaid Other | Source: Home / Self Care | Admitting: Obstetrics and Gynecology

## 2021-01-10 SURGERY — SALPINGECTOMY, BILATERAL, LAPAROSCOPIC
Anesthesia: Choice | Laterality: Bilateral

## 2021-06-30 ENCOUNTER — Encounter: Payer: Self-pay | Admitting: Radiology

## 2022-06-17 ENCOUNTER — Encounter (HOSPITAL_COMMUNITY): Payer: Self-pay

## 2022-06-17 ENCOUNTER — Emergency Department (HOSPITAL_COMMUNITY)
Admission: EM | Admit: 2022-06-17 | Discharge: 2022-06-17 | Disposition: A | Discharge status: Home / Self Care | Payer: Medicaid Other | Attending: Emergency Medicine | Admitting: Emergency Medicine

## 2022-06-17 ENCOUNTER — Emergency Department (HOSPITAL_COMMUNITY): Payer: Medicaid Other

## 2022-06-17 DIAGNOSIS — J069 Acute upper respiratory infection, unspecified: Secondary | ICD-10-CM | POA: Diagnosis not present

## 2022-06-17 DIAGNOSIS — J4521 Mild intermittent asthma with (acute) exacerbation: Secondary | ICD-10-CM | POA: Diagnosis not present

## 2022-06-17 DIAGNOSIS — Z20822 Contact with and (suspected) exposure to covid-19: Secondary | ICD-10-CM | POA: Diagnosis not present

## 2022-06-17 DIAGNOSIS — R059 Cough, unspecified: Secondary | ICD-10-CM | POA: Diagnosis present

## 2022-06-17 LAB — RESP PANEL BY RT-PCR (FLU A&B, COVID) ARPGX2
Influenza A by PCR: NEGATIVE
Influenza B by PCR: NEGATIVE
SARS Coronavirus 2 by RT PCR: NEGATIVE

## 2022-06-17 MED ORDER — ALBUTEROL SULFATE HFA 108 (90 BASE) MCG/ACT IN AERS
2.0000 | INHALATION_SPRAY | Freq: Four times a day (QID) | RESPIRATORY_TRACT | Status: DC | PRN
  Administered 2022-06-17: 2 via RESPIRATORY_TRACT
  Filled 2022-06-17: qty 6.7

## 2022-06-17 MED ORDER — IPRATROPIUM-ALBUTEROL 0.5-2.5 (3) MG/3ML IN SOLN
3.0000 mL | Freq: Once | RESPIRATORY_TRACT | Status: AC
Start: 2022-06-17 — End: 2022-06-17
  Administered 2022-06-17: 3 mL via RESPIRATORY_TRACT
  Filled 2022-06-17: qty 3

## 2022-06-17 NOTE — ED Provider Notes (Signed)
[facility] DEPT Provider Note   CSN: [id number] Arrival date & time: 06/17/22  5102     History  Chief Complaint  Patient presents with   Cough    Erin Price is a 31 y.o. female.  Patient with a 2-day history of upper respiratory symptoms.  Also had some wheezing upon arrival here today.  Patient uses a nebulizer treatment at home but out of her inhaler.  Patient had some vomiting because the coughing was so severe overnight.  And her rib cage is sore.  Patient denies any significant sore throat or fevers.  Patient is never had strep pharyngitis in the past.  Past medical history is significant for asthma migraines seizure disorder last seizure was in May 2018 ovarian cyst and a history of scoliosis and had screws placed for scoliosis.  Patient is not a tobacco user.       Home Medications Prior to Admission medications   Medication Sig Start Date End Date Taking? Authorizing Provider  norethindrone (MICRONOR) 0.35 MG tablet Take 1 tablet (0.35 mg total) by mouth daily. 10/25/20   Janet Berlin, MD  Prenatal Vit-Fe Fumarate-FA (MULTIVITAMIN-PRENATAL) 27-0.8 MG TABS tablet Take 1 tablet by mouth daily at 12 noon. 03/29/20   Sloan Leiter, MD      Allergies    Patient has no known allergies.    Review of Systems   Review of Systems  Constitutional:  Negative for chills and fever.  HENT:  Negative for ear pain and sore throat.   Eyes:  Negative for pain and visual disturbance.  Respiratory:  Positive for cough, shortness of breath and wheezing. Negative for stridor.   Cardiovascular:  Negative for chest pain and palpitations.  Gastrointestinal:  Positive for vomiting. Negative for abdominal pain.  Genitourinary:  Negative for dysuria and hematuria.  Musculoskeletal:  Positive for myalgias. Negative for arthralgias and back pain.  Skin:  Negative for color change and rash.  Neurological:  Negative for seizures and syncope.  All other  systems reviewed and are negative.   Physical Exam Updated Vital Signs BP 95/63 (BP Location: Right Arm)   Pulse 95   Temp 98.5 F (36.9 C) (Oral)   Resp 18   LMP  (LMP Unknown)   SpO2 100%  Physical Exam Vitals and nursing note reviewed.  Constitutional:      General: She is not in acute distress.    Appearance: Normal appearance. She is well-developed.  HENT:     Head: Normocephalic and atraumatic.     Mouth/Throat:     Mouth: Mucous membranes are moist.     Pharynx: Oropharynx is clear. No oropharyngeal exudate or posterior oropharyngeal erythema.  Eyes:     Extraocular Movements: Extraocular movements intact.     Conjunctiva/sclera: Conjunctivae normal.     Pupils: Pupils are equal, round, and reactive to light.  Cardiovascular:     Rate and Rhythm: Normal rate and regular rhythm.     Heart sounds: No murmur heard. Pulmonary:     Effort: Pulmonary effort is normal. No respiratory distress.     Breath sounds: No stridor. Wheezing present. No rhonchi or rales.  Abdominal:     Palpations: Abdomen is soft.     Tenderness: There is no abdominal tenderness.  Musculoskeletal:        General: No swelling.     Cervical back: Normal range of motion and neck supple.  Skin:    General: Skin is warm and dry.  Capillary Refill: Capillary refill takes less than 2 seconds.  Neurological:     General: No focal deficit present.     Mental Status: She is alert and oriented to person, place, and time.  Psychiatric:        Mood and Affect: Mood normal.     ED Results / Procedures / Treatments   Labs (all labs ordered are listed, but only abnormal results are displayed) Labs Reviewed  RESP PANEL BY RT-PCR (FLU A&B, COVID) [id number]    EKG None  Radiology DG Chest 2 View  Result Date: 06/17/2022 CLINICAL DATA:  31 year old female with cough EXAM: CHEST - 2 VIEW COMPARISON:  04/13/2017 FINDINGS: Cardiomediastinal silhouette unchanged in size and contour. No evidence of  central vascular congestion. No interlobular septal thickening. No pneumothorax or pleural effusion. No confluent airspace disease. No acute displaced fracture Surgical changes of the lumbar spine again incompletely imaged, with scoliotic curvature of the thoracolumbar spine similar to the prior. IMPRESSION: No active cardiopulmonary disease. Electronically Signed   By: Gilmer Mor D.O.   On: 06/17/2022 09:15    Procedures Procedures    Medications Ordered in ED Medications  albuterol (VENTOLIN HFA) 108 (90 Base) MCG/ACT inhaler 2 puff (2 puffs Inhalation Given 06/17/22 0937)  ipratropium-albuterol (DUONEB) 0.5-2.5 (3) MG/3ML nebulizer solution 3 mL (3 mLs Nebulization Given 06/17/22 0911)    ED Course/ Medical Decision Making/ A&P                           Medical Decision Making Risk Prescription drug management.  Patient nontoxic no acute distress.  Throat clear may be some slight erythema but nothing concerning for strep pharyngitis.  Patient's chest x-ray negative for pneumonia.  All wheezing resolved with the nebulizer treatment that she received here was a DuoNeb.  Symptoms seem to be consistent with either an asthma exacerbation or an upper respiratory infection.  COVID testing done since she works for [employer] she will follow that up on MyChart.  We will give her albuterol inhaler to go.  Recommend over-the-counter cold and flu medications.  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  Patient will return for any new or worse symptoms.  Not febrile here oxygen saturation 100% on room air.  Final Clinical Impression(s) / ED Diagnoses Final diagnoses:  Viral URI with cough  Mild intermittent asthma with acute exacerbation    Rx / DC Orders ED Discharge Orders     None         Vanetta Mulders, MD 06/17/22 [phone]

## 2022-06-17 NOTE — ED Triage Notes (Signed)
Pt presents with c/o cough for 2 days. Pt reports a hx of asthma, reports she feels like she has been wheezing but does not have an inhaler at home that she can use because she is out of the medication. Pt reports pain in her rib cage and her back from coughing. Pt able to talk in complete sentences, NAD.

## 2022-06-17 NOTE — ED Provider Triage Note (Signed)
Emergency Medicine Provider Triage Evaluation Note  Erin Price , a 31 y.o. female  was evaluated in triage.  Pt complains of  productive cough, nasal congestion, and rhinorrhea for the past 2 days. She reports that she has been coughing so hard she has been throwing up and has rib and back pain when coughing. Reports that she took Tylenol Cold with mild relief.  Review of Systems  Positive:  Negative:   Physical Exam  BP 95/63 (BP Location: Right Arm)   Pulse 95   Temp 98.5 F (36.9 C) (Oral)   Resp 18   LMP  (LMP Unknown)   SpO2 100%  Gen:   Awake, no distress   Resp:  Normal effort  MSK:   Moves extremities without difficulty  Other:  Mild expiratory wheezing  Medical Decision Making  Medically screening exam initiated at 8:55 AM.  Appropriate orders placed.  Erin Price was informed that the remainder of the evaluation will be completed by another provider, this initial triage assessment does not replace that evaluation, and the importance of remaining in the ED until their evaluation is complete.  CXR and neb ordered.    Erin Price, [place] 06/17/22 [phone]

## 2022-06-17 NOTE — Discharge Instructions (Signed)
Chest x-ray negative for pneumonia.  COVID testing flu testing is pending.  Check that on MyChart.  Use the albuterol inhaler 2 puffs every 6 hours for the next 7 days.  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  If your COVID testing is positive notify your employer who is [employer] that you have a positive test and you will have to quarantine.  Return for new or worse symptoms.  Also can use over-the-counter cold and flu medicines.

## 2023-09-24 NOTE — L&D Delivery Note (Signed)
 [facility]N [facility] Delivery Note  Erin Price is a 33 y.o. [id number] s/p SVD at [redacted]w[redacted]d. She was admitted for SOL.   ROM: 0h 8m with clear fluid GBS Status: Negative/-- (10/03 1147)    Labor Progress: Initial SVE: 6.5/90/-1. She then progressed to complete.   Delivery Date/Time: 07/07/2024 at 2314 Delivery: Called to room and patient was complete and pushing. Head delivered LOA. No nuchal cord present. Shoulder and body delivered in usual fashion. Infant with spontaneous cry, placed on mother's abdomen, dried and stimulated. Cord clamped x 2 after 1-minute delay, and cut by FOB. Cord blood drawn. Placenta delivered spontaneously with gentle cord traction. Fundus firm with massage and Pitocin . Labia, perineum, vagina, and cervix inspected inspected with no lacerations noted.  Baby Weight: pending  Placenta: Sent to L&D Complications: None Lacerations: None EBL: 60 mL Analgesia: None  Infant:  APGAR (1 MIN):  9 APGAR (5 MINS):  9

## 2023-11-10 ENCOUNTER — Other Ambulatory Visit: Payer: Self-pay

## 2023-11-10 ENCOUNTER — Inpatient Hospital Stay (HOSPITAL_COMMUNITY)
Admission: AD | Admit: 2023-11-10 | Discharge: 2023-11-10 | Disposition: A | Discharge status: Home / Self Care | Payer: Medicaid Other | Attending: Obstetrics and Gynecology | Admitting: Obstetrics and Gynecology

## 2023-11-10 ENCOUNTER — Inpatient Hospital Stay (HOSPITAL_COMMUNITY): Payer: Medicaid Other

## 2023-11-10 ENCOUNTER — Encounter (HOSPITAL_COMMUNITY): Payer: Self-pay | Admitting: Obstetrics and Gynecology

## 2023-11-10 DIAGNOSIS — Z3A01 Less than 8 weeks gestation of pregnancy: Secondary | ICD-10-CM | POA: Diagnosis not present

## 2023-11-10 DIAGNOSIS — R1030 Lower abdominal pain, unspecified: Secondary | ICD-10-CM | POA: Diagnosis present

## 2023-11-10 DIAGNOSIS — O26899 Other specified pregnancy related conditions, unspecified trimester: Secondary | ICD-10-CM

## 2023-11-10 DIAGNOSIS — O3680X0 Pregnancy with inconclusive fetal viability, not applicable or unspecified: Secondary | ICD-10-CM | POA: Diagnosis not present

## 2023-11-10 DIAGNOSIS — O0991 Supervision of high risk pregnancy, unspecified, first trimester: Secondary | ICD-10-CM | POA: Insufficient documentation

## 2023-11-10 DIAGNOSIS — Z79899 Other long term (current) drug therapy: Secondary | ICD-10-CM | POA: Diagnosis not present

## 2023-11-10 DIAGNOSIS — O99351 Diseases of the nervous system complicating pregnancy, first trimester: Secondary | ICD-10-CM | POA: Insufficient documentation

## 2023-11-10 DIAGNOSIS — R569 Unspecified convulsions: Secondary | ICD-10-CM | POA: Insufficient documentation

## 2023-11-10 DIAGNOSIS — O26891 Other specified pregnancy related conditions, first trimester: Secondary | ICD-10-CM | POA: Insufficient documentation

## 2023-11-10 LAB — URINALYSIS, ROUTINE W REFLEX MICROSCOPIC
Bilirubin Urine: NEGATIVE
Glucose, UA: NEGATIVE mg/dL
Ketones, ur: NEGATIVE mg/dL
Leukocytes,Ua: NEGATIVE
Nitrite: NEGATIVE
Protein, ur: NEGATIVE mg/dL
Specific Gravity, Urine: 1.027 (ref 1.005–1.030)
pH: 5 (ref 5.0–8.0)

## 2023-11-10 LAB — WET PREP, GENITAL
Clue Cells Wet Prep HPF POC: NONE SEEN
Sperm: NONE SEEN
Trich, Wet Prep: NONE SEEN
WBC, Wet Prep HPF POC: <10 (ref <10)
Yeast Wet Prep HPF POC: NONE SEEN

## 2023-11-10 LAB — RPR: RPR Ser Ql: NONREACTIVE

## 2023-11-10 LAB — CBC
HCT: 39.9 % (ref 36.0–46.0)
Hemoglobin: 14 g/dL (ref 12.0–15.0)
MCH: 31 pg (ref 26.0–34.0)
MCHC: 35.1 g/dL (ref 30.0–36.0)
MCV: 88.5 fL (ref 80.0–100.0)
Platelets: 193 10*3/uL (ref 150–400)
RBC: 4.51 MIL/uL (ref 3.87–5.11)
RDW: 12.1 % (ref 11.5–15.5)
WBC: 7.8 10*3/uL (ref 4.0–10.5)
nRBC: 0 % (ref 0.0–0.2)

## 2023-11-10 LAB — HCG, QUANTITATIVE, PREGNANCY: hCG, Beta Chain, Quant, S: 319 m[IU]/mL — ABNORMAL HIGH (ref <5)

## 2023-11-10 LAB — POCT PREGNANCY, URINE: Preg Test, Ur: POSITIVE — AB

## 2023-11-10 LAB — HIV ANTIBODY (ROUTINE TESTING W REFLEX): HIV Screen 4th Generation wRfx: NONREACTIVE

## 2023-11-10 NOTE — MAU Provider Note (Signed)
History     CSN: [id number]  Arrival date and time: 11/10/23 1191   Event Date/Time   First Provider Initiated Contact with Patient 11/10/23 1203      Chief Complaint  Patient presents with   Abdominal Pain   HPI Ms. Erin Price is a 33 y.o. year old [id number] female at [redacted]w[redacted]d weeks gestation by LMP of 09/21/2023 who presents to MAU reporting she was seen at her PCP today for her annual exam. She told them she missed her period and had not been feeling well. She had a (+) HPT yesterday and (+) UPT in her PCP's office today. The PCP told her to come to MAU to be evaluated since she has a "h/o high risk pregnancies." Her previous pregnancies were managed at the [facility] (last was almost 3 years ago). She reports sh has had lower abdominal cramping that concerned her. She has a h/o seizures and take Gabapentin 300 mg TID (last dose this AM). She has not had a seizure in over 5 years, because she has been on her medication. She says her PCP told her to stop taking her medication today, but she wanted a second opinion on that since she was okay to take it during her last pregnancy. Her seizures are managed by [facility] in [place]; next appt is 12/26/2023. She plans to establish [facility] with [facility].  OB History     Gravida  5   Para  4   Term  3   Preterm  1   AB      Living  4      SAB      IAB      Ectopic      Multiple  0   Live Births  4        Obstetric Comments  G1: 37wks. 7lbs 4oz.          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   Asthma    Chlamydia    Closed head injury    Migraine    Ovarian cyst    Preterm labor    Scoliosis    Seizures (HCC)    last seizure May 2018   Status epilepticus (HCC) 05/19/2015    Past Surgical History:  Procedure Laterality Date   BACK SURGERY     screws placed for scoliosis    Family History  Problem Relation  Age of Onset   Hypertension Father    Stroke Father    Diabetes Maternal Grandmother    Hypertension Maternal Grandmother    Diabetes Paternal Grandmother    Hypertension Paternal Grandmother    Anesthesia problems Neg Hx     Social History   Tobacco Use   Smoking status: Never   Smokeless tobacco: Never  Vaping Use   Vaping status: Never Used  Substance Use Topics   Alcohol use: No   Drug use: Not Currently    Types: Marijuana    Comment: last use 02/04/2020    Allergies: No Known Allergies  Medications Prior to Admission  Medication Sig Dispense Refill Last Dose/Taking   norethindrone (MICRONOR) 0.35 MG tablet Take 1 tablet (0.35 mg total) by mouth daily. 30 tablet 11    Prenatal Vit-Fe Fumarate-FA (MULTIVITAMIN-PRENATAL) 27-0.8 MG TABS tablet Take 1 tablet by mouth daily at 12 noon. 30 tablet 11     Review of Systems  Constitutional: Negative.  HENT: Negative.    Eyes: Negative.   Respiratory: Negative.    Cardiovascular: Negative.   Gastrointestinal: Negative.   Endocrine: Negative.   Genitourinary:  Positive for pelvic pain (cramping).  Musculoskeletal: Negative.   Skin: Negative.   Allergic/Immunologic: Negative.   Neurological: Negative.   Hematological: Negative.   Psychiatric/Behavioral: Negative.     Physical Exam   Blood pressure 117/69, pulse 93, temperature 98.5 F (36.9 C), temperature source Oral, resp. rate 19, height 5\' 1"  (1.549 m), weight 60.1 kg, last menstrual period 09/21/2023, SpO2 100%.  Physical Exam Vitals and nursing note reviewed.  Constitutional:      Appearance: Normal appearance. She is normal weight.  Cardiovascular:     Rate and Rhythm: Normal rate.  Pulmonary:     Effort: Pulmonary effort is normal.  Genitourinary:    Comments: Swabs collected by patient using blind swab technique  Musculoskeletal:        General: Normal range of motion.  Skin:    General: Skin is warm and dry.  Neurological:     Mental Status:  She is alert and oriented to person, place, and time.  Psychiatric:        Mood and Affect: Mood normal.        Behavior: Behavior normal.        Thought Content: Thought content normal.        Judgment: Judgment normal.    MAU Course  Procedures  MDM CCUA UPT CBC ABO/Rh HCG Wet Prep GC/CT -- Results pending  RPR -- Results pending  OB U/S < 14 wks TVUS  Results for orders placed or performed during the hospital encounter of 11/10/23 (from the past 24 hours)  Pregnancy, urine POC     Status: Abnormal   Collection Time: 11/10/23  9:00 AM  Result Value Ref Range   Preg Test, Ur POSITIVE (A) NEGATIVE  Urinalysis, Routine w reflex microscopic -Urine, Clean Catch     Status: Abnormal   Collection Time: 11/10/23  9:35 AM  Result Value Ref Range   Color, Urine YELLOW YELLOW   APPearance CLEAR CLEAR   Specific Gravity, Urine 1.027 1.005 - 1.030   pH 5.0 5.0 - 8.0   Glucose, UA NEGATIVE NEGATIVE mg/dL   Hgb urine dipstick MODERATE (A) NEGATIVE   Bilirubin Urine NEGATIVE NEGATIVE   Ketones, ur NEGATIVE NEGATIVE mg/dL   Protein, ur NEGATIVE NEGATIVE mg/dL   Nitrite NEGATIVE NEGATIVE   Leukocytes,Ua NEGATIVE NEGATIVE   RBC / HPF 0-5 0 - 5 RBC/hpf   WBC, UA 0-5 0 - 5 WBC/hpf   Bacteria, UA RARE (A) NONE SEEN   Squamous Epithelial / HPF 0-5 0 - 5 /HPF   Mucus PRESENT   Wet prep, genital     Status: None   Collection Time: 11/10/23  9:39 AM   Specimen: Vaginal  Result Value Ref Range   Yeast Wet Prep HPF POC NONE SEEN NONE SEEN   Trich, Wet Prep NONE SEEN NONE SEEN   Clue Cells Wet Prep HPF POC NONE SEEN NONE SEEN   WBC, Wet Prep HPF POC <10 <10   Sperm NONE SEEN   CBC     Status: None   Collection Time: 11/10/23  9:57 AM  Result Value Ref Range   WBC 7.8 4.0 - 10.5 K/uL   RBC 4.51 3.87 - 5.11 MIL/uL   Hemoglobin 14.0 12.0 - 15.0 g/dL   HCT 16.1 09.6 - 04.5 %   MCV 88.5 80.0 - 100.0  fL   MCH 31.0 26.0 - 34.0 pg   MCHC 35.1 30.0 - 36.0 g/dL   RDW 82.9 56.2 - 13.0 %    Platelets 193 150 - 400 K/uL   nRBC 0.0 0.0 - 0.2 %  hCG, quantitative, pregnancy     Status: Abnormal   Collection Time: 11/10/23  9:57 AM  Result Value Ref Range   hCG, Beta Chain, Quant, S 319 (H) <5 mIU/mL  HIV Antibody (routine testing w rflx)     Status: None   Collection Time: 11/10/23  9:57 AM  Result Value Ref Range   HIV Screen 4th Generation wRfx Non Reactive Non Reactive  RPR     Status: None   Collection Time: 11/10/23  9:57 AM  Result Value Ref Range   RPR Ser Ql NON REACTIVE NON REACTIVE    US OB LESS THAN 14 WEEKS WITH OB TRANSVAGINAL Result Date: 11/10/2023 CLINICAL DATA:  [id number] Abdominal pain during pregnancy in first trimester [id number]. EXAM: OBSTETRIC <14 WK [place] AND TRANSVAGINAL OB US TECHNIQUE: Both transabdominal and transvaginal ultrasound examinations were performed for complete evaluation of the gestation as well as the maternal uterus, adnexal regions, and pelvic cul-de-sac. Transvaginal technique was performed to assess early pregnancy. COMPARISON:  None Available. FINDINGS: Intrauterine gestational sac: None. Maternal uterus/adnexae: Normal-sized retroverted uterus. No focal uterine mass seen. Endometrium measures up to 8 mm in thickness. No intrauterine gestational sac noted. No focal endometrial lesion. There are few scattered 1-2 mm endometrial/subendometrial cysts, which are nonspecific. Bilateral ovaries are within normal limits. Small amount of anechoic free fluid noted in the cul de sac, which may be physiological in the patient of this age group. IMPRESSION: *No intrauterine gestation is seen and no adnexal mass is evident. These findings represent a pregnancy of unknown location. The sonographic differential diagnosis includes: an intrauterine gestation too early to visualize, a spontaneous abortion, or an occult ectopic pregnancy. Continued close clinical follow-up, serial serum beta-HCG levels and short term sonographic follow up in 7-14 days, or earlier if  clinically indicated, is recommended. Electronically Signed   By: Jules Schick M.D.   On: 11/10/2023 12:04     Assessment and Plan  1. Pregnancy of unknown anatomic location (Primary) - Information provided on ectopic pregnancy   2. Abdominal pain affecting pregnancy - Information provided on abdominal pain in pregnancy   3. [redacted] weeks gestation of pregnancy - Information provided on safe meds in pregnancy list - Advised to continue taking Gabapentin as previously prescribed d/t the it being riskier not taking it in pregnancy and having seizures vs taking it in pregnancy    - Discharge patient - Keep scheduled appt with [facility] on 11/12/2023 @ 1035 for rpt HCG. - Advised to arrive at 1020 for the check in process - Patient verbalized an understanding of the plan of care and agrees.  Raelyn Mora, CNM 11/10/2023, 12:04 PM

## 2023-11-10 NOTE — MAU Note (Signed)
Erin Price is a 33 y.o. at Unknown here in MAU reporting: she had +HPT yesterday and has been cramping since yesterday.  Denies VB.  States LMP 09/21/2023, but had spotting from 10/16/23 through 10/17/2023.  LMP: 09/21/2023 regular period Onset of complaint: yesterday Pain score: 7 Vitals:   11/10/23 0929  BP: 117/69  Pulse: 93  Resp: 19  Temp: 98.5 F (36.9 C)  SpO2: 100%     FHT: NA  Lab orders placed from triage: UPT & UA

## 2023-11-10 NOTE — Discharge Instructions (Addendum)
Restart Gabapentin to prevent seizure activity; which is worse in pregnancy than taking that medication.  Safe Medications in Pregnancy   Acne: Benzoyl Peroxide Salicylic Acid  Backache/Headache: Tylenol: 2 regular strength every 4 hours OR              2 Extra strength every 6 hours  Colds/Coughs/Allergies: Benadryl (alcohol free) 25 mg every 6 hours as needed Breath right strips Claritin Cepacol throat lozenges Chloraseptic throat spray Cold-Eeze- up to three times per day Cough drops, alcohol free Flonase (by prescription only) Guaifenesin Mucinex Robitussin DM (plain only, alcohol free) Saline nasal spray/drops Sudafed (pseudoephedrine) & Actifed ** use only after [redacted] weeks gestation and if you do not have high blood pressure Tylenol Vicks Vaporub Zinc lozenges Zyrtec   Constipation: Colace Ducolax suppositories Fleet enema Glycerin suppositories Metamucil Milk of magnesia Miralax Senokot Smooth move tea  Diarrhea: Kaopectate Imodium A-D  *NO pepto Bismol  Hemorrhoids: Anusol Anusol HC Preparation H Tucks  Indigestion: Tums Maalox Mylanta Zantac  Pepcid  Insomnia: Benadryl (alcohol free) 25mg  every 6 hours as needed Tylenol PM Unisom, no Gelcaps  Leg Cramps: Tums MagGel  Nausea/Vomiting:  Bonine Dramamine Emetrol Ginger extract Sea bands Meclizine  Nausea medication to take during pregnancy:  Unisom (doxylamine succinate 25 mg tablets) Take one tablet daily at bedtime. If symptoms are not adequately controlled, the dose can be increased to a maximum recommended dose of two tablets daily (1/2 tablet in the morning, 1/2 tablet mid-afternoon and one at bedtime). Vitamin B6 100mg  tablets. Take one tablet twice a day (up to 200 mg per day).  Skin Rashes: Aveeno products Benadryl cream or 25mg  every 6 hours as needed Calamine Lotion 1% cortisone cream  Yeast infection: Gyne-lotrimin 7 Monistat 7   **If taking multiple  medications, please check labels to avoid duplicating the same active ingredients **take medication as directed on the label ** Do not exceed 4000 mg of tylenol in 24 hours **Do not take medications that contain aspirin or ibuprofen

## 2023-11-11 LAB — GC/CHLAMYDIA PROBE AMP (~~LOC~~) NOT AT ARMC
Chlamydia: NEGATIVE
Comment: NEGATIVE
Comment: NORMAL
Neisseria Gonorrhea: NEGATIVE

## 2023-11-12 ENCOUNTER — Ambulatory Visit (INDEPENDENT_AMBULATORY_CARE_PROVIDER_SITE_OTHER): Payer: Medicaid Other | Admitting: *Deleted

## 2023-11-12 ENCOUNTER — Other Ambulatory Visit: Payer: Self-pay

## 2023-11-12 VITALS — BP 118/72 | HR 92 | Ht 61.0 in | Wt 130.5 lb

## 2023-11-12 DIAGNOSIS — O3680X0 Pregnancy with inconclusive fetal viability, not applicable or unspecified: Secondary | ICD-10-CM

## 2023-11-12 DIAGNOSIS — O26899 Other specified pregnancy related conditions, unspecified trimester: Secondary | ICD-10-CM

## 2023-11-12 DIAGNOSIS — Z3A01 Less than 8 weeks gestation of pregnancy: Secondary | ICD-10-CM | POA: Diagnosis not present

## 2023-11-12 LAB — BETA HCG QUANT (REF LAB): hCG Quant: 838 m[IU]/mL

## 2023-11-12 NOTE — Progress Notes (Signed)
Pt presents for stat BHCG following MAU visit on 2/17.  She reports having no abdominal pain or vaginal bleeding. BHCG drawn and pt notified that she will receive results later today. She reports having phone problems and stated that she can receive result message via Mychart. Pt was instructed to return to MAU if she develops heavy vaginal bleeding or abdominal pain. She voiced understanding and had no questions.

## 2023-11-13 ENCOUNTER — Encounter: Payer: Self-pay | Admitting: Obstetrics and Gynecology

## 2023-11-21 ENCOUNTER — Emergency Department (HOSPITAL_BASED_OUTPATIENT_CLINIC_OR_DEPARTMENT_OTHER)
Admission: EM | Admit: 2023-11-21 | Discharge: 2023-11-21 | Disposition: A | Discharge status: Home / Self Care | Payer: Medicaid Other | Attending: Emergency Medicine | Admitting: Emergency Medicine

## 2023-11-21 ENCOUNTER — Encounter (HOSPITAL_BASED_OUTPATIENT_CLINIC_OR_DEPARTMENT_OTHER): Payer: Self-pay | Admitting: *Deleted

## 2023-11-21 ENCOUNTER — Other Ambulatory Visit: Payer: Self-pay

## 2023-11-21 DIAGNOSIS — O219 Vomiting of pregnancy, unspecified: Secondary | ICD-10-CM | POA: Diagnosis present

## 2023-11-21 DIAGNOSIS — Z3A08 8 weeks gestation of pregnancy: Secondary | ICD-10-CM | POA: Insufficient documentation

## 2023-11-21 DIAGNOSIS — O210 Mild hyperemesis gravidarum: Secondary | ICD-10-CM

## 2023-11-21 LAB — URINALYSIS, W/ REFLEX TO CULTURE (INFECTION SUSPECTED)
Bilirubin Urine: NEGATIVE
Glucose, UA: NEGATIVE mg/dL
Ketones, ur: 40 mg/dL — AB
Leukocytes,Ua: NEGATIVE
Nitrite: NEGATIVE
Protein, ur: 30 mg/dL — AB
Specific Gravity, Urine: 1.034 — ABNORMAL HIGH (ref 1.005–1.030)
pH: 6 (ref 5.0–8.0)

## 2023-11-21 LAB — RESP PANEL BY RT-PCR (RSV, FLU A&B, COVID)  RVPGX2
Influenza A by PCR: NEGATIVE
Influenza B by PCR: NEGATIVE
Resp Syncytial Virus by PCR: NEGATIVE
SARS Coronavirus 2 by RT PCR: NEGATIVE

## 2023-11-21 LAB — PREGNANCY, URINE: Preg Test, Ur: POSITIVE — AB

## 2023-11-21 MED ORDER — DOXYLAMINE-PYRIDOXINE 10-10 MG PO TBEC: 2.0000 | DELAYED_RELEASE_TABLET | Freq: Every day | ORAL | 0 refills | Status: DC

## 2023-11-21 MED ORDER — SODIUM CHLORIDE 0.9 % IV BOLUS
1000.0000 mL | Freq: Once | INTRAVENOUS | Status: AC
  Administered 2023-11-21: 1000 mL via INTRAVENOUS

## 2023-11-21 MED ORDER — ONDANSETRON 4 MG PO TBDP
4.0000 mg | ORAL_TABLET | Freq: Once | ORAL | Status: AC
  Administered 2023-11-21: 4 mg via ORAL
  Filled 2023-11-21: qty 1

## 2023-11-21 NOTE — ED Notes (Signed)
 Gatorade and crackers given, sips encouraged

## 2023-11-21 NOTE — ED Notes (Signed)
 Additional water and tea given

## 2023-11-21 NOTE — ED Notes (Signed)
 Pt was able to drink the cup of water and keep it down.

## 2023-11-21 NOTE — ED Provider Notes (Signed)
 [place] EMERGENCY DEPARTMENT AT [facility] Provider Note   CSN: [id number] Arrival date & time: 11/21/23  4098     History  Chief Complaint  Patient presents with   Hyperemesis Gravidarum    Erin Price is a 33 y.o. female.  Patient is a 33 year old female who presents with nausea and vomiting.  She states she is about 8 to [redacted] weeks pregnant.  She has an upcoming ultrasound scheduled for March 4.  She is G5, P4.  She has had some ongoing nausea and vomiting.  She has not female keep anything down since yesterday.  She denies any cough or cold symptoms.  No fevers.  No urinary symptoms.  No vaginal bleeding or discharge.  No associate abdominal pain.  No change in her stools.       Home Medications Prior to Admission medications   Medication Sig Start Date End Date Taking? Authorizing Provider  Doxylamine-Pyridoxine (DICLEGIS) 10-10 MG TBEC Take 2 tablets by mouth at bedtime. 11/21/23  Yes Rolan Bucco, MD  Prenatal Vit-Fe Fumarate-FA (MULTIVITAMIN-PRENATAL) 27-0.8 MG TABS tablet Take 1 tablet by mouth daily at 12 noon. 03/29/20   Conan Bowens, MD      Allergies    Patient has no known allergies.    Review of Systems   Review of Systems  Constitutional:  Negative for chills, diaphoresis, fatigue and fever.  HENT:  Negative for congestion, rhinorrhea and sneezing.   Eyes: Negative.   Respiratory:  Negative for cough, chest tightness and shortness of breath.   Cardiovascular:  Negative for chest pain and leg swelling.  Gastrointestinal:  Positive for nausea and vomiting. Negative for abdominal pain, blood in stool and diarrhea.  Genitourinary:  Negative for difficulty urinating, flank pain, frequency and hematuria.  Musculoskeletal:  Negative for arthralgias and back pain.  Skin:  Negative for rash.  Neurological:  Negative for dizziness, speech difficulty, weakness, numbness and headaches.    Physical Exam Updated Vital Signs BP 128/76 (BP Location: Left  Arm)   Pulse 89   Temp 98.9 F (37.2 C) (Oral)   Resp 15   Ht 5\' 1"  (1.549 m)   Wt 59 kg   LMP 09/21/2023   SpO2 100%   BMI 24.56 kg/m  Physical Exam Constitutional:      Appearance: She is well-developed.  HENT:     Head: Normocephalic and atraumatic.  Eyes:     Pupils: Pupils are equal, round, and reactive to light.  Cardiovascular:     Rate and Rhythm: Normal rate and regular rhythm.     Heart sounds: Normal heart sounds.  Pulmonary:     Effort: Pulmonary effort is normal. No respiratory distress.     Breath sounds: Normal breath sounds. No wheezing or rales.  Chest:     Chest wall: No tenderness.  Abdominal:     General: Bowel sounds are normal.     Palpations: Abdomen is soft.     Tenderness: There is no abdominal tenderness. There is no guarding or rebound.  Musculoskeletal:        General: Normal range of motion.     Cervical back: Normal range of motion and neck supple.  Lymphadenopathy:     Cervical: No cervical adenopathy.  Skin:    General: Skin is warm and dry.     Findings: No rash.  Neurological:     Mental Status: She is alert and oriented to person, place, and time.     ED Results / Procedures /  Treatments   Labs (all labs ordered are listed, but only abnormal results are displayed) Labs Reviewed  URINALYSIS, W/ REFLEX TO CULTURE (INFECTION SUSPECTED) - Abnormal; Notable for the following components:      Result Value   APPearance HAZY (*)    Specific Gravity, Urine 1.034 (*)    Hgb urine dipstick MODERATE (*)    Ketones, ur 40 (*)    Protein, ur 30 (*)    Bacteria, UA FEW (*)    All other components within normal limits  PREGNANCY, URINE - Abnormal; Notable for the following components:   Preg Test, Ur POSITIVE (*)    All other components within normal limits  RESP PANEL BY RT-PCR (RSV, FLU A&B, COVID)  RVPGX2    EKG None  Radiology No results found.  Procedures Procedures    Medications Ordered in ED Medications  ondansetron  (ZOFRAN-ODT) disintegrating tablet 4 mg (4 mg Oral Given 11/21/23 1000)  sodium chloride 0.9 % bolus 1,000 mL (1,000 mLs Intravenous New Bag/Given 11/21/23 1414)    ED Course/ Medical Decision Making/ A&P                                 Medical Decision Making Amount and/or Complexity of Data Reviewed Labs: ordered.  Risk Prescription drug management.   Patient is a 33 year old and her first trimester pregnancy who presents with nausea and vomiting.  She has no associated abdominal pain.  No change in stools.  No urinary symptoms.  No fevers.  No concerns for infection.  She does not have any bleeding, lower abdominal pain or other concerns for ectopic pregnancy.  Urine does not show any concerns for infection.  She was given IV fluids and initially dose of Zofran.  She is feeling much better.  She is able to eat and drink without any ongoing symptoms.  Will prescribe her Diclegis.  She is feeling much better and ready for discharge.  She was encouraged to follow-up with her OB/GYN.  She has an ultrasound scheduled next week.  Return precautions were given.  Final Clinical Impression(s) / ED Diagnoses Final diagnoses:  Hyperemesis gravidarum    Rx / DC Orders ED Discharge Orders          Ordered    Doxylamine-Pyridoxine (DICLEGIS) 10-10 MG TBEC  Daily at bedtime        11/21/23 1440              Rolan Bucco, MD 11/21/23 1523

## 2023-11-21 NOTE — ED Notes (Signed)
 Pt given discharge instructions and reviewed prescriptions. Opportunities given for questions. Pt verbalizes understanding. PIV removed x1. Jillyn Hidden, RN

## 2023-11-21 NOTE — ED Triage Notes (Signed)
 Pt reports that she is [redacted] weeks pregnant and has been having nausea and vomiting for a week with severe nausea for the past 3 days.  No sick contacts.

## 2023-11-24 ENCOUNTER — Other Ambulatory Visit: Payer: Self-pay | Admitting: *Deleted

## 2023-11-24 DIAGNOSIS — O3680X0 Pregnancy with inconclusive fetal viability, not applicable or unspecified: Secondary | ICD-10-CM

## 2023-11-24 NOTE — Addendum Note (Signed)
 Addended by: Jill Side on: 11/24/2023 01:59 PM   Modules accepted: Orders

## 2023-11-25 ENCOUNTER — Ambulatory Visit (INDEPENDENT_AMBULATORY_CARE_PROVIDER_SITE_OTHER): Payer: Medicaid Other

## 2023-11-25 ENCOUNTER — Other Ambulatory Visit: Payer: Self-pay

## 2023-11-25 DIAGNOSIS — O3680X0 Pregnancy with inconclusive fetal viability, not applicable or unspecified: Secondary | ICD-10-CM

## 2023-11-25 DIAGNOSIS — Z3A01 Less than 8 weeks gestation of pregnancy: Secondary | ICD-10-CM

## 2023-11-25 DIAGNOSIS — Z3491 Encounter for supervision of normal pregnancy, unspecified, first trimester: Secondary | ICD-10-CM

## 2023-12-01 ENCOUNTER — Other Ambulatory Visit: Payer: Self-pay

## 2023-12-01 ENCOUNTER — Emergency Department (HOSPITAL_BASED_OUTPATIENT_CLINIC_OR_DEPARTMENT_OTHER)
Admission: EM | Admit: 2023-12-01 | Discharge: 2023-12-01 | Disposition: A | Discharge status: Home / Self Care | Attending: Emergency Medicine | Admitting: Emergency Medicine

## 2023-12-01 ENCOUNTER — Encounter (HOSPITAL_BASED_OUTPATIENT_CLINIC_OR_DEPARTMENT_OTHER): Payer: Self-pay

## 2023-12-01 DIAGNOSIS — F1290 Cannabis use, unspecified, uncomplicated: Secondary | ICD-10-CM | POA: Diagnosis not present

## 2023-12-01 DIAGNOSIS — R112 Nausea with vomiting, unspecified: Secondary | ICD-10-CM | POA: Diagnosis present

## 2023-12-01 DIAGNOSIS — J45909 Unspecified asthma, uncomplicated: Secondary | ICD-10-CM | POA: Diagnosis not present

## 2023-12-01 LAB — CBC WITH DIFFERENTIAL/PLATELET
Abs Immature Granulocytes: 0.02 10*3/uL (ref 0.00–0.07)
Basophils Absolute: 0 10*3/uL (ref 0.0–0.1)
Basophils Relative: 0 %
Eosinophils Absolute: 0 10*3/uL (ref 0.0–0.5)
Eosinophils Relative: 0 %
HCT: 39.9 % (ref 36.0–46.0)
Hemoglobin: 14.1 g/dL (ref 12.0–15.0)
Immature Granulocytes: 0 %
Lymphocytes Relative: 19 %
Lymphs Abs: 1.7 10*3/uL (ref 0.7–4.0)
MCH: 31.1 pg (ref 26.0–34.0)
MCHC: 35.3 g/dL (ref 30.0–36.0)
MCV: 88.1 fL (ref 80.0–100.0)
Monocytes Absolute: 0.5 10*3/uL (ref 0.1–1.0)
Monocytes Relative: 6 %
Neutro Abs: 6.8 10*3/uL (ref 1.7–7.7)
Neutrophils Relative %: 75 %
Platelets: 207 10*3/uL (ref 150–400)
RBC: 4.53 MIL/uL (ref 3.87–5.11)
RDW: 11.9 % (ref 11.5–15.5)
WBC: 9.2 10*3/uL (ref 4.0–10.5)
nRBC: 0 % (ref 0.0–0.2)

## 2023-12-01 LAB — URINALYSIS, ROUTINE W REFLEX MICROSCOPIC
Bacteria, UA: NONE SEEN
Bilirubin Urine: NEGATIVE
Glucose, UA: NEGATIVE mg/dL
Ketones, ur: >80 mg/dL — AB
Leukocytes,Ua: NEGATIVE
Nitrite: NEGATIVE
Protein, ur: 30 mg/dL — AB
Specific Gravity, Urine: 1.038 — ABNORMAL HIGH (ref 1.005–1.030)
pH: 5.5 (ref 5.0–8.0)

## 2023-12-01 LAB — COMPREHENSIVE METABOLIC PANEL
ALT: 12 U/L (ref 0–44)
AST: 14 U/L — ABNORMAL LOW (ref 15–41)
Albumin: 4.7 g/dL (ref 3.5–5.0)
Alkaline Phosphatase: 54 U/L (ref 38–126)
Anion gap: 10 (ref 5–15)
BUN: 11 mg/dL (ref 6–20)
CO2: 25 mmol/L (ref 22–32)
Calcium: 9.7 mg/dL (ref 8.9–10.3)
Chloride: 101 mmol/L (ref 98–111)
Creatinine, Ser: 0.62 mg/dL (ref 0.44–1.00)
GFR, Estimated: >60 mL/min (ref >60)
Glucose, Bld: 79 mg/dL (ref 70–99)
Potassium: 4.1 mmol/L (ref 3.5–5.1)
Sodium: 136 mmol/L (ref 135–145)
Total Bilirubin: 0.8 mg/dL (ref 0.0–1.2)
Total Protein: 7.4 g/dL (ref 6.5–8.1)

## 2023-12-01 LAB — RAPID URINE DRUG SCREEN, HOSP PERFORMED
Amphetamines: NOT DETECTED
Barbiturates: NOT DETECTED
Benzodiazepines: NOT DETECTED
Cocaine: NOT DETECTED
Opiates: NOT DETECTED
Tetrahydrocannabinol: POSITIVE — AB

## 2023-12-01 LAB — RESP PANEL BY RT-PCR (RSV, FLU A&B, COVID)  RVPGX2
Influenza A by PCR: NEGATIVE
Influenza B by PCR: NEGATIVE
Resp Syncytial Virus by PCR: NEGATIVE
SARS Coronavirus 2 by RT PCR: NEGATIVE

## 2023-12-01 LAB — MAGNESIUM: Magnesium: 2.1 mg/dL (ref 1.7–2.4)

## 2023-12-01 MED ORDER — PROMETHAZINE HCL 25 MG PO TABS: 25.0000 mg | ORAL_TABLET | Freq: Four times a day (QID) | ORAL | 0 refills | Status: DC | PRN

## 2023-12-01 MED ORDER — ONDANSETRON 4 MG PO TBDP
8.0000 mg | ORAL_TABLET | Freq: Once | ORAL | Status: AC
  Administered 2023-12-01: 8 mg via ORAL
  Filled 2023-12-01: qty 2

## 2023-12-01 MED ORDER — SODIUM CHLORIDE 0.9 % IV BOLUS
1000.0000 mL | Freq: Once | INTRAVENOUS | Status: AC
  Administered 2023-12-01: 1000 mL via INTRAVENOUS

## 2023-12-01 MED ORDER — PROMETHAZINE HCL 25 MG RE SUPP: 25.0000 mg | Freq: Four times a day (QID) | RECTAL | 0 refills | Status: DC | PRN

## 2023-12-01 NOTE — ED Provider Notes (Cosign Needed Addendum)
 [place] EMERGENCY DEPARTMENT AT [facility] Provider Note   CSN: [id number] Arrival date & time: 12/01/23  1135     History  Chief Complaint  Patient presents with   Emesis    Erin Price is a 33 y.o. female.   Emesis   33 year old female presents emergency department with complaints of nausea, emesis.  States that she has been having symptoms consistently for the 1 to 2 weeks.  States that she has had "morning sickness" with prior pregnancies but usually isolated to the time with a few episodes of emesis.  States that this pregnancy, has had vomiting throughout the day with difficulty tolerating p.o.  Was seen in the ER on 2/28 for similar presentation and sent home with Diclegis.  States that the medicine has not been working.  Has scheduled appointment with OB/GYN.  Did have ultrasound done on 3//25 which showed IUP without complications.  Denies any fevers, hematemesis, abdominal pain, urinary symptoms, change in bowel habits.  Patient G5, P4.  Reports marijuana use but states that she stopped this when she found out she was pregnant on 11/12/2023; denies any other substance use.  End of last menstrual period 10/16/2023.  Past medical history significant for asthma, seizure, substance abuse  Home Medications Prior to Admission medications   Medication Sig Start Date End Date Taking? Authorizing Provider  promethazine (PHENERGAN) 25 MG suppository Place 1 suppository (25 mg total) rectally every 6 (six) hours as needed for nausea or vomiting. 12/01/23  Yes Peter Garter, PA  promethazine (PHENERGAN) 25 MG tablet Take 1 tablet (25 mg total) by mouth every 6 (six) hours as needed for nausea or vomiting. 12/01/23  Yes Sherian Maroon A, PA  Doxylamine-Pyridoxine (DICLEGIS) 10-10 MG TBEC Take 2 tablets by mouth at bedtime. 11/21/23   Rolan Bucco, MD  Prenatal Vit-Fe Fumarate-FA (MULTIVITAMIN-PRENATAL) 27-0.8 MG TABS tablet Take 1 tablet by mouth daily at 12 noon.  03/29/20   Conan Bowens, MD      Allergies    Patient has no known allergies.    Review of Systems   Review of Systems  Gastrointestinal:  Positive for vomiting.  All other systems reviewed and are negative.   Physical Exam Updated Vital Signs BP 111/73 (BP Location: Right Arm)   Pulse 82   Temp 98.7 F (37.1 C)   Resp 18   LMP 09/21/2023   SpO2 100%  Physical Exam Vitals and nursing note reviewed.  Constitutional:      General: She is not in acute distress.    Appearance: She is well-developed.  HENT:     Head: Normocephalic and atraumatic.  Eyes:     Conjunctiva/sclera: Conjunctivae normal.  Cardiovascular:     Rate and Rhythm: Normal rate and regular rhythm.     Heart sounds: No murmur heard. Pulmonary:     Effort: Pulmonary effort is normal. No respiratory distress.     Breath sounds: Normal breath sounds. No wheezing, rhonchi or rales.  Abdominal:     Palpations: Abdomen is soft.     Tenderness: There is no abdominal tenderness. There is no right CVA tenderness, left CVA tenderness or guarding.  Musculoskeletal:        General: No swelling.     Cervical back: Neck supple.  Skin:    General: Skin is warm and dry.     Capillary Refill: Capillary refill takes less than 2 seconds.  Neurological:     Mental Status: She is alert.  Psychiatric:  Mood and Affect: Mood normal.     ED Results / Procedures / Treatments   Labs (all labs ordered are listed, but only abnormal results are displayed) Labs Reviewed  COMPREHENSIVE METABOLIC PANEL - Abnormal; Notable for the following components:      Result Value   AST 14 (*)    All other components within normal limits  RAPID URINE DRUG SCREEN, HOSP PERFORMED - Abnormal; Notable for the following components:   Tetrahydrocannabinol POSITIVE (*)    All other components within normal limits  RESP PANEL BY RT-PCR (RSV, FLU A&B, COVID)  RVPGX2  CBC WITH DIFFERENTIAL/PLATELET  MAGNESIUM  URINALYSIS, ROUTINE W  REFLEX MICROSCOPIC    EKG None  Radiology No results found.  Procedures Procedures    Medications Ordered in ED Medications  ondansetron (ZOFRAN-ODT) disintegrating tablet 8 mg (8 mg Oral Given 12/01/23 1438)  sodium chloride 0.9 % bolus 1,000 mL (0 mLs Intravenous Stopped 12/01/23 1528)    ED Course/ Medical Decision Making/ A&P Clinical Course as of 12/01/23 1552  Mon Dec 01, 2023  1422 1/23 end lmp [CR]    Clinical Course User Index [CR] Peter Garter, PA                                 Medical Decision Making Amount and/or Complexity of Data Reviewed Labs: ordered.  Risk Prescription drug management.   This patient presents to the ED for concern of nausea, vomiting, this involves an extensive number of treatment options, and is a complaint that carries with it a high risk of complications and morbidity.  The differential diagnosis includes viral gastroenteritis, foodborne illness, hyperemesis cannabinoid syndrome, morning sickness, SBO*, volvulus, other   Co morbidities that complicate the patient evaluation  See HPI   Additional history obtained:  Additional history obtained from EMR External records from outside source obtained and reviewed including hospital records   Lab Tests:  I Ordered, and personally interpreted labs.  The pertinent results include: No leukocytosis.  No evidence of anemia.  Platelets within range.  No Electra abnormalities.  No transaminitis.  No renal dysfunction.  UDS positive for THC.  UA pending.   Imaging Studies ordered:  N/a   Cardiac Monitoring: / EKG:  The patient was maintained on a cardiac monitor.  I personally viewed and interpreted the cardiac monitored which showed an underlying rhythm of: sinus rhythm   Consultations Obtained:  N/a   Problem List / ED Course / Critical interventions / Medication management  Nausea, vomiting I ordered medication including Zofran, normal saline   Reevaluation of  the patient after these medicines showed that the patient improved I have reviewed the patients home medicines and have made adjustments as needed   Social Determinants of Health:  Denies current tobacco, substance use.   Test / Admission - Considered:  Nausea, vomiting  vitals signs within normal range and stable throughout visit. Laboratory studies significant for: See above 33 year old female presents emergency department with complaints of nausea, emesis.  States that she has been having symptoms consistently for the 1 to 2 weeks.  States that she has had "morning sickness" with prior pregnancies but usually isolated to the time with a few episodes of emesis.  States that this pregnancy, has had vomiting throughout the day with difficulty tolerating p.o.  Was seen in the ER on 2/28 for similar presentation and sent home with Diclegis.  States that the medicine  has not been working.  Has scheduled appointment with OB/GYN.  Did have ultrasound done on 3//25 which showed IUP without complications.  Denies any fevers, hematemesis, abdominal pain, urinary symptoms, change in bowel habits.  Patient G5, P4.  Reports marijuana use but states that she stopped this when she found out she was pregnant on 11/12/2023; denies any other substance use.  End of last menstrual period 10/16/2023. On exam, no abdominal tenderness.  Laboratory studies reassuring.  Fetal heart tones within normal limits as described by nursing staff.  Some concern for hyperemesis gravidarum but laboratory studies appeared normal; pending UA results at this time but due to prolonged amount of time, standing for results was deferred.  Treated with IV fluids as well as antiemetic and noted improvement of symptoms.  Will add additional antiemetic in the outpatient setting to use as needed.  Recommend close follow-up with OB/GYN in the outpatient setting.  Suspect the patient's symptoms could be secondary to [facility] use.  Recommend abstinence  from [facility] products.  Abdominal imaging deferred given lack of abdominal tenderness/described pain.  Treatment plan discussed at length with patient and she acknowledged understanding was agreeable to said plan.  Patient overall well-appearing, afebrile in no acute distress. Worrisome signs and symptoms were discussed with the patient, and the patient acknowledged understanding to return to the ED if noticed. Patient was stable upon discharge.          Final Clinical Impression(s) / ED Diagnoses Final diagnoses:  Nausea and vomiting, unspecified vomiting type  Marijuana use    Rx / DC Orders          Peter Garter, PA 12/01/23 1730    Sloan Leiter, DO 12/02/23 [phone]

## 2023-12-01 NOTE — ED Notes (Signed)
 Discharge paperwork given and verbally understood.

## 2023-12-01 NOTE — Discharge Instructions (Addendum)
 As discussed, your blood work appeared reassuring.  The urine testing machine is currently under maintenance so we will send you a message if your urine looks infected.  Will send you home with a different nausea medicine to use as needed.  Recommend follow-up with your OB/GYN for further evaluation regarding your symptoms.

## 2023-12-01 NOTE — ED Notes (Signed)
 Fetal heart tones obtained by Consulting civil engineer.

## 2023-12-01 NOTE — ED Triage Notes (Signed)
 Pt c/o continued NV since visit 2/28- "the med they gave me isn't helping." States she is pregnant, G5P4, "this is nothing like my other morning sickness." 10wks April 1- first prenatal visit then

## 2023-12-23 ENCOUNTER — Telehealth

## 2023-12-23 DIAGNOSIS — O0990 Supervision of high risk pregnancy, unspecified, unspecified trimester: Secondary | ICD-10-CM | POA: Insufficient documentation

## 2023-12-23 DIAGNOSIS — Z3A10 10 weeks gestation of pregnancy: Secondary | ICD-10-CM

## 2023-12-23 DIAGNOSIS — O0991 Supervision of high risk pregnancy, unspecified, first trimester: Secondary | ICD-10-CM

## 2023-12-23 MED ORDER — ONDANSETRON HCL 4 MG PO TABS: 4.0000 mg | ORAL_TABLET | Freq: Three times a day (TID) | ORAL | 0 refills | Status: DC | PRN

## 2023-12-23 MED ORDER — BLOOD PRESSURE KIT DEVI: 1.0000 | 0 refills | Status: AC | PRN

## 2023-12-23 MED ORDER — SCOPOLAMINE 1 MG/3DAYS TD PT72: 1.0000 | MEDICATED_PATCH | TRANSDERMAL | 1 refills | Status: DC

## 2023-12-23 NOTE — Progress Notes (Signed)
 Patient stated she need a pregnancy verification letter for her upcoming [facility] appointment. Letter sent via Mychart. Patient also sated she had a short cervix in her last 2 pregnancies and was placing a pill vaginally daily and also getting a shot she believe was weekly. Pt mentioned she has had to go to [facility] multiple times for IV due to knot being able to keep anything down. Was prescribed phenergan but doesn't help and makes her vomit more. Zofran helps in the when at [facility]U. Per Dr. Crissie Reese Zofran and scopolamine patch was sent to pharmacy.   B'Aisha, CMA

## 2023-12-23 NOTE — Patient Instructions (Signed)

## 2023-12-23 NOTE — Progress Notes (Signed)
 New OB Intake  I connected with Erin Price  on 12/23/23 at  9:15 AM EDT by MyChart Video Visit and verified that I am speaking with the correct person using two identifiers. Nurse is located at [facility] and pt is located at home.  I discussed the limitations, risks, security and privacy concerns of performing an evaluation and management service by telephone and the availability of in person appointments. I also discussed with the patient that there may be a patient responsible charge related to this service. The patient expressed understanding and agreed to proceed.  I explained I am completing New OB Intake today. We discussed EDD of 07/18/2024, by Ultrasound. Pt is G5P3104. I reviewed her allergies, medications and Medical/Surgical/OB history.    Patient Active Problem List   Diagnosis Date Noted   Seizures [facility])     Concerns addressed today  Delivery Plans Plans to deliver at [facility]. Discussed the nature of our practice with multiple providers including residents and students. Due to the size of the practice, the delivering provider may not be the same as those providing prenatal care.   Patient is not interested in water birth. Offered upcoming OB visit with CNM to discuss further.  MyChart/Babyscripts MyChart access verified. I explained pt will have some visits in office and some virtually. Babyscripts instructions given and order placed. Patient verifies receipt of registration text/e-mail. Account successfully created and app downloaded. If patient is a candidate for Optimized scheduling, add to sticky note.   Blood Pressure Cuff/Weight Scale Blood pressure cuff ordered for patient to pick-up from [employer]. Explained after first prenatal appt pt will check weekly and document in Babyscripts.  Anatomy US Explained first scheduled [place] will be around 19 weeks. Anatomy US scheduled for 02/23/24 at 10:00AM.  Is patient a CenteringPregnancy candidate?  Declined Declined  due to Declined to say   Is patient a Mom+Baby Combined Care candidate?  Not a candidate   If accepted, confirm patient does not intend to move from the area for at least 12 months, then notify Mom+Baby staff  Interested in [facility]? If yes, send referral and doula dot phrase.   Is patient a candidate for Babyscripts Optimization? No DUE TO HIGH RISK   First visit review I reviewed new OB appt with patient. Explained pt will be seen by Dr. Vergie Living at first visit. Discussed Avelina Laine genetic screening with patient. [employer] and [employer].. Routine prenatal labs is needed at [facility] appointment.  Last Pap Diagnosis  Date Value Ref Range Status  04/05/2020   Final   - Negative for intraepithelial lesion or malignancy (NILM)    B'Aisha T Pippa Hanif, CMA 12/23/2023  9:13 AM

## 2023-12-30 ENCOUNTER — Encounter: Payer: Self-pay | Admitting: Obstetrics and Gynecology

## 2023-12-30 ENCOUNTER — Other Ambulatory Visit: Payer: Self-pay

## 2023-12-30 ENCOUNTER — Other Ambulatory Visit (HOSPITAL_COMMUNITY)
Admission: RE | Admit: 2023-12-30 | Discharge: 2023-12-30 | Disposition: A | Discharge status: Home / Self Care | Source: Ambulatory Visit | Attending: Obstetrics and Gynecology | Admitting: Obstetrics and Gynecology

## 2023-12-30 ENCOUNTER — Ambulatory Visit: Payer: Self-pay | Admitting: Obstetrics and Gynecology

## 2023-12-30 VITALS — BP 103/67 | HR 96 | Wt 124.0 lb

## 2023-12-30 DIAGNOSIS — O0991 Supervision of high risk pregnancy, unspecified, first trimester: Secondary | ICD-10-CM

## 2023-12-30 DIAGNOSIS — Z1332 Encounter for screening for maternal depression: Secondary | ICD-10-CM | POA: Diagnosis not present

## 2023-12-30 DIAGNOSIS — O09891 Supervision of other high risk pregnancies, first trimester: Secondary | ICD-10-CM

## 2023-12-30 DIAGNOSIS — Z3A11 11 weeks gestation of pregnancy: Secondary | ICD-10-CM

## 2023-12-30 DIAGNOSIS — O0990 Supervision of high risk pregnancy, unspecified, unspecified trimester: Secondary | ICD-10-CM | POA: Insufficient documentation

## 2023-12-30 DIAGNOSIS — O09899 Supervision of other high risk pregnancies, unspecified trimester: Secondary | ICD-10-CM | POA: Insufficient documentation

## 2023-12-30 DIAGNOSIS — Z23 Encounter for immunization: Secondary | ICD-10-CM

## 2023-12-30 DIAGNOSIS — Z87898 Personal history of other specified conditions: Secondary | ICD-10-CM
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
## 2023-12-30 NOTE — Patient Instructions (Signed)

## 2023-12-31 LAB — GC/CHLAMYDIA PROBE AMP (~~LOC~~) NOT AT ARMC
Chlamydia: NEGATIVE
Comment: NEGATIVE
Comment: NORMAL
Neisseria Gonorrhea: NEGATIVE

## 2024-01-01 LAB — URINE CULTURE, OB REFLEX: Organism ID, Bacteria: NO GROWTH

## 2024-01-01 LAB — CULTURE, OB URINE

## 2024-01-02 LAB — CBC/D/PLT+RPR+RH+ABO+RUBIGG...
Antibody Screen: NEGATIVE
Basophils Absolute: 0.1 10*3/uL (ref 0.0–0.2)
Basos: 1 %
EOS (ABSOLUTE): 0.1 10*3/uL (ref 0.0–0.4)
Eos: 1 %
HCV Ab: NONREACTIVE
HIV Screen 4th Generation wRfx: NONREACTIVE
Hematocrit: 35.3 % (ref 34.0–46.6)
Hemoglobin: 11.9 g/dL (ref 11.1–15.9)
Hepatitis B Surface Ag: NEGATIVE
Immature Grans (Abs): 0 10*3/uL (ref 0.0–0.1)
Immature Granulocytes: 0 %
Lymphocytes Absolute: 1.7 10*3/uL (ref 0.7–3.1)
Lymphs: 21 %
MCH: 31.3 pg (ref 26.6–33.0)
MCHC: 33.7 g/dL (ref 31.5–35.7)
MCV: 93 fL (ref 79–97)
Monocytes Absolute: 0.4 10*3/uL (ref 0.1–0.9)
Monocytes: 5 %
Neutrophils Absolute: 5.7 10*3/uL (ref 1.4–7.0)
Neutrophils: 72 %
Platelets: 193 10*3/uL (ref 150–450)
RBC: 3.8 x10E6/uL (ref 3.77–5.28)
RDW: 12.6 % (ref 11.7–15.4)
RPR Ser Ql: NONREACTIVE
Rh Factor: POSITIVE
Rubella Antibodies, IGG: 1.22 {index} (ref >0.99)
WBC: 7.9 10*3/uL (ref 3.4–10.8)

## 2024-01-02 LAB — HCV INTERPRETATION

## 2024-01-02 LAB — HEMOGLOBIN A1C
Est. average glucose Bld gHb Est-mCnc: 97 mg/dL
Hgb A1c MFr Bld: 5 % (ref 4.8–5.6)

## 2024-01-04 LAB — PANORAMA PRENATAL TEST FULL PANEL:PANORAMA TEST PLUS 5 ADDITIONAL MICRODELETIONS: FETAL FRACTION: 9

## 2024-01-05 ENCOUNTER — Encounter: Payer: Self-pay | Admitting: *Deleted

## 2024-01-27 ENCOUNTER — Ambulatory Visit (INDEPENDENT_AMBULATORY_CARE_PROVIDER_SITE_OTHER): Admitting: Obstetrics and Gynecology

## 2024-01-27 ENCOUNTER — Encounter: Payer: Self-pay | Admitting: Obstetrics and Gynecology

## 2024-01-27 ENCOUNTER — Other Ambulatory Visit: Payer: Self-pay

## 2024-01-27 VITALS — BP 103/65 | HR 86 | Wt 127.2 lb

## 2024-01-27 DIAGNOSIS — Z3A15 15 weeks gestation of pregnancy: Secondary | ICD-10-CM

## 2024-01-27 DIAGNOSIS — O09899 Supervision of other high risk pregnancies, unspecified trimester: Secondary | ICD-10-CM

## 2024-01-27 DIAGNOSIS — O0992 Supervision of high risk pregnancy, unspecified, second trimester: Secondary | ICD-10-CM

## 2024-01-27 DIAGNOSIS — O09892 Supervision of other high risk pregnancies, second trimester: Secondary | ICD-10-CM | POA: Diagnosis not present

## 2024-01-27 DIAGNOSIS — O0990 Supervision of high risk pregnancy, unspecified, unspecified trimester: Secondary | ICD-10-CM

## 2024-01-27 NOTE — Progress Notes (Signed)
   PRENATAL VISIT NOTE  Subjective:  Erin Price is a 33 y.o. [id number] at [redacted]w[redacted]d being seen today for ongoing prenatal care.  She is currently monitored for the following issues for this low-risk pregnancy and has Supervision of high risk pregnancy, antepartum and History of preterm delivery, currently pregnant on their problem list.  Patient reports no complaints.  Contractions: Not present. Vag. Bleeding: None.  Movement: Present. Denies leaking of fluid.   The following portions of the patient's history were reviewed and updated as appropriate: allergies, current medications, past family history, past medical history, past social history, past surgical history and problem list.   Objective:   Vitals:   01/27/24 1043  BP: 103/65  Pulse: 86  Weight: 127 lb 3.2 oz (57.7 kg)    Fetal Status:     Movement: Present     General:  Alert, oriented and cooperative. Patient is in no acute distress.  Skin: Skin is warm and dry. No rash noted.   Cardiovascular: Normal heart rate noted  Respiratory: Normal respiratory effort, no problems with respiration noted  Abdomen: Soft, gravid, appropriate for gestational age.  Pain/Pressure: Absent     Pelvic: Cervical exam deferred        Extremities: Normal range of motion.  Edema: None  Mental Status: Normal mood and affect. Normal behavior. Normal judgment and thought content.   Bedside US : gross fetal movement seen, normal heartbeat, singleton pregnancy  Assessment and Plan:  Pregnancy: A5W0981 at [redacted]w[redacted]d  1. Supervision of high risk pregnancy, antepartum (Primary)  2. [redacted] weeks gestation of pregnancy AFP today  3. History of preterm delivery, currently pregnant Has TVUS MFM for 02/06/24  Preterm labor symptoms and general obstetric precautions including but not limited to vaginal bleeding, contractions, leaking of fluid and fetal movement were reviewed in detail with the patient. Please refer to After Visit Summary for other counseling  recommendations.   Return in about 1 month (around 02/27/2024) for high OB.  Future Appointments  Date Time Provider Department Center  02/06/2024  9:00 AM [facility] PROVIDER 1 [facility]  02/06/2024  9:30 AM [facility] US4 [facility]  02/23/2024 10:00 AM [facility] PROVIDER 1 [facility]  02/23/2024 10:30 AM [facility] US3 [facility]    Jan Mcgill, MD

## 2024-01-29 ENCOUNTER — Encounter: Payer: Self-pay | Admitting: Obstetrics and Gynecology

## 2024-01-29 LAB — AFP, SERUM, OPEN SPINA BIFIDA
AFP MoM: 1.33
AFP Value: 44.2 ng/mL
Gest. Age on Collection Date: 15.2 wk
Maternal Age At EDD: 32.9 a
OSBR Risk 1 IN: 4399
Test Results:: NEGATIVE
Weight: 127 [lb_av]

## 2024-02-04 ENCOUNTER — Telehealth

## 2024-02-06 ENCOUNTER — Ambulatory Visit: Attending: Obstetrics and Gynecology

## 2024-02-06 ENCOUNTER — Other Ambulatory Visit: Payer: Self-pay | Admitting: *Deleted

## 2024-02-06 ENCOUNTER — Ambulatory Visit: Admitting: Obstetrics and Gynecology

## 2024-02-06 ENCOUNTER — Other Ambulatory Visit: Payer: Self-pay | Admitting: Family Medicine

## 2024-02-06 VITALS — BP 95/56 | HR 108

## 2024-02-06 DIAGNOSIS — O09212 Supervision of pregnancy with history of pre-term labor, second trimester: Secondary | ICD-10-CM | POA: Diagnosis not present

## 2024-02-06 DIAGNOSIS — O26872 Cervical shortening, second trimester: Secondary | ICD-10-CM

## 2024-02-06 DIAGNOSIS — Z3A16 16 weeks gestation of pregnancy: Secondary | ICD-10-CM | POA: Insufficient documentation

## 2024-02-06 DIAGNOSIS — Z3A17 17 weeks gestation of pregnancy: Secondary | ICD-10-CM

## 2024-02-06 DIAGNOSIS — O09899 Supervision of other high risk pregnancies, unspecified trimester: Secondary | ICD-10-CM

## 2024-02-06 DIAGNOSIS — O0990 Supervision of high risk pregnancy, unspecified, unspecified trimester: Secondary | ICD-10-CM

## 2024-02-06 DIAGNOSIS — O09299 Supervision of pregnancy with other poor reproductive or obstetric history, unspecified trimester: Secondary | ICD-10-CM

## 2024-02-06 DIAGNOSIS — O09892 Supervision of other high risk pregnancies, second trimester: Secondary | ICD-10-CM

## 2024-02-06 NOTE — Progress Notes (Signed)
 After review, MFM consult with provider is not indicated for today  Erin Clever, MD 02/06/2024 2:10 PM  [facility]

## 2024-02-23 ENCOUNTER — Ambulatory Visit: Admitting: Obstetrics

## 2024-02-23 ENCOUNTER — Other Ambulatory Visit: Payer: Self-pay | Admitting: *Deleted

## 2024-02-23 ENCOUNTER — Ambulatory Visit: Attending: Obstetrics and Gynecology

## 2024-02-23 VITALS — BP 99/54 | HR 93

## 2024-02-23 DIAGNOSIS — O358XX0 Maternal care for other (suspected) fetal abnormality and damage, not applicable or unspecified: Secondary | ICD-10-CM | POA: Insufficient documentation

## 2024-02-23 DIAGNOSIS — O0990 Supervision of high risk pregnancy, unspecified, unspecified trimester: Secondary | ICD-10-CM | POA: Insufficient documentation

## 2024-02-23 DIAGNOSIS — Z3A19 19 weeks gestation of pregnancy: Secondary | ICD-10-CM | POA: Diagnosis present

## 2024-02-23 DIAGNOSIS — O35BXX0 Maternal care for other (suspected) fetal abnormality and damage, fetal cardiac anomalies, not applicable or unspecified: Secondary | ICD-10-CM

## 2024-02-23 DIAGNOSIS — O09299 Supervision of pregnancy with other poor reproductive or obstetric history, unspecified trimester: Secondary | ICD-10-CM | POA: Diagnosis present

## 2024-02-23 DIAGNOSIS — O09212 Supervision of pregnancy with history of pre-term labor, second trimester: Secondary | ICD-10-CM

## 2024-02-23 DIAGNOSIS — O09899 Supervision of other high risk pregnancies, unspecified trimester: Secondary | ICD-10-CM

## 2024-02-23 DIAGNOSIS — O09292 Supervision of pregnancy with other poor reproductive or obstetric history, second trimester: Secondary | ICD-10-CM | POA: Insufficient documentation

## 2024-02-23 DIAGNOSIS — O26872 Cervical shortening, second trimester: Secondary | ICD-10-CM

## 2024-02-23 NOTE — Progress Notes (Signed)
 MFM Consult Note  Erin Price is currently at 19 weeks and 1 day.  She was seen due to a history of a prior preterm birth at 35 weeks and 4 days.    She denies any problems in her current pregnancy and denies any significant past medical history.  She had a cell free DNA test earlier in her pregnancy which indicated a low risk for trisomy 85, 20, and 13. A female fetus is predicted.   She was informed that the fetal growth and amniotic fluid level were appropriate for her gestational age.   On today's exam, an intracardiac echogenic focus was noted in the left ventricle of the fetal heart.    The small association between an echogenic focus and Down syndrome was discussed.   Due to the echogenic focus noted today, the patient was offered and declined an amniocentesis today for definitive diagnosis of fetal aneuploidy.  She reports that she is comfortable with her negative cell free DNA test.    The patient reports that all of her 4 other children were found to have an echogenic focus during their fetal anatomy scans.  Her children are fine today and therefore she is less concerned regarding this finding.  The patient was informed that anomalies may be missed due to technical limitations. If the fetus is in a suboptimal position or maternal habitus is increased, visualization of the fetus in the maternal uterus may be impaired.  Due to her history of a prior preterm birth, a transvaginal ultrasound performed today showed a cervical length of 4 cm long without any signs of funneling.   The patient was reassured that based on the normal cervical length noted today, her risk of a preterm birth is low.  A follow-up exam was scheduled in the third trimester (in 9 weeks).    The patient stated that all of her questions were answered today.  A total of 45 minutes was spent counseling and coordinating the care for this patient.  Greater than 50% of the time was spent in direct face-to-face  contact.

## 2024-02-26 ENCOUNTER — Other Ambulatory Visit: Payer: Self-pay

## 2024-02-26 ENCOUNTER — Ambulatory Visit: Admitting: Obstetrics and Gynecology

## 2024-02-26 VITALS — BP 96/65 | HR 90 | Wt 135.0 lb

## 2024-02-26 DIAGNOSIS — Z3A19 19 weeks gestation of pregnancy: Secondary | ICD-10-CM | POA: Diagnosis not present

## 2024-02-26 DIAGNOSIS — O09899 Supervision of other high risk pregnancies, unspecified trimester: Secondary | ICD-10-CM

## 2024-02-26 DIAGNOSIS — O0990 Supervision of high risk pregnancy, unspecified, unspecified trimester: Secondary | ICD-10-CM

## 2024-02-26 DIAGNOSIS — O09212 Supervision of pregnancy with history of pre-term labor, second trimester: Secondary | ICD-10-CM

## 2024-02-26 NOTE — Progress Notes (Signed)
   PRENATAL VISIT NOTE  Subjective:  Erin Price is a 33 y.o. [id number] at [redacted]w[redacted]d being seen today for ongoing prenatal care.  She is currently monitored for the following issues for this high-risk pregnancy and has Supervision of high risk pregnancy, antepartum and History of preterm delivery, currently pregnant on their problem list.  Patient doing well with no acute concerns today. She reports no complaints.  Contractions: Not present. Vag. Bleeding: None.  Movement: Present. Denies leaking of fluid.   The following portions of the patient's history were reviewed and updated as appropriate: allergies, current medications, past family history, past medical history, past social history, past surgical history and problem list. Problem list updated.  Objective:   Vitals:   02/26/24 1028  BP: 96/65  Pulse: 90  Weight: 135 lb (61.2 kg)    Fetal Status: Fetal Heart Rate (bpm): 143 Fundal Height: 20 cm Movement: Present     General:  Alert, oriented and cooperative. Patient is in no acute distress.  Skin: Skin is warm and dry. No rash noted.   Cardiovascular: Normal heart rate noted  Respiratory: Normal respiratory effort, no problems with respiration noted  Abdomen: Soft, gravid, appropriate for gestational age.  Pain/Pressure: Absent     Pelvic: Cervical exam deferred        Extremities: Normal range of motion.  Edema: None  Mental Status:  Normal mood and affect. Normal behavior. Normal judgment and thought content.   Assessment and Plan:  Pregnancy: [id number] at [redacted]w[redacted]d  1. [redacted] weeks gestation of pregnancy (Primary)   2. Supervision of high risk pregnancy, antepartum Continue routine prenatal care  3. History of preterm delivery, currently pregnant No s/sx of preterm contractions  Preterm labor symptoms and general obstetric precautions including but not limited to vaginal bleeding, contractions, leaking of fluid and fetal movement were reviewed in detail with the  patient.  Please refer to After Visit Summary for other counseling recommendations.   Return in about 4 weeks (around 03/25/2024) for [facility], in person.   Avie Boeck, MD Faculty Attending Center for [facility]

## 2024-02-27 ENCOUNTER — Ambulatory Visit: Payer: Self-pay | Admitting: Family Medicine

## 2024-02-27 DIAGNOSIS — O0990 Supervision of high risk pregnancy, unspecified, unspecified trimester: Secondary | ICD-10-CM

## 2024-02-27 DIAGNOSIS — O283 Abnormal ultrasonic finding on antenatal screening of mother: Secondary | ICD-10-CM | POA: Insufficient documentation

## 2024-03-30 ENCOUNTER — Ambulatory Visit: Admitting: Family Medicine

## 2024-03-30 ENCOUNTER — Other Ambulatory Visit: Payer: Self-pay

## 2024-03-30 VITALS — BP 105/69 | HR 101 | Wt 144.3 lb

## 2024-03-30 DIAGNOSIS — O0990 Supervision of high risk pregnancy, unspecified, unspecified trimester: Secondary | ICD-10-CM

## 2024-03-30 DIAGNOSIS — O09212 Supervision of pregnancy with history of pre-term labor, second trimester: Secondary | ICD-10-CM

## 2024-03-30 DIAGNOSIS — Z3A24 24 weeks gestation of pregnancy: Secondary | ICD-10-CM

## 2024-03-30 DIAGNOSIS — O09899 Supervision of other high risk pregnancies, unspecified trimester: Secondary | ICD-10-CM

## 2024-03-30 NOTE — Progress Notes (Signed)
   PRENATAL VISIT NOTE  Subjective:  Erin Price is a 33 y.o. [id number] at [redacted]w[redacted]d being seen today for ongoing prenatal care.  She is currently monitored for the following issues for this high-risk pregnancy and has Supervision of high risk pregnancy, antepartum; History of preterm delivery, currently pregnant; and Echogenic intracardiac focus of fetus on prenatal ultrasound on their problem list.  Patient reports no complaints.   . Vag. Bleeding: None.  Movement: Present. Denies leaking of fluid.   The following portions of the patient's history were reviewed and updated as appropriate: allergies, current medications, past family history, past medical history, past social history, past surgical history and problem list.   Objective:    Vitals:   03/30/24 1544  BP: 105/69  Pulse: (!) 101  Weight: 144 lb 4.8 oz (65.5 kg)    Fetal Status:  Fetal Heart Rate (bpm): 147 Fundal Height: 24 cm Movement: Present    General: Alert, oriented and cooperative. Patient is in no acute distress.  Skin: Skin is warm and dry. No rash noted.   Cardiovascular: Normal heart rate noted  Respiratory: Normal respiratory effort, no problems with respiration noted  Abdomen: Soft, gravid, appropriate for gestational age.  Pain/Pressure: Absent     Pelvic: Cervical exam deferred        Extremities: Normal range of motion.  Edema: Trace  Mental Status: Normal mood and affect. Normal behavior. Normal judgment and thought content.   Assessment and Plan:  Pregnancy: G5P3104 at [redacted]w[redacted]d 1. [redacted] weeks gestation of pregnancy  2. Supervision of high risk pregnancy, antepartum (Primary) Up to date FH appropriate Feeling movement No concerns today Reviewed upcoming GTT testing  3. History of preterm delivery, currently pregnant    Preterm labor symptoms and general obstetric precautions including but not limited to vaginal bleeding, contractions, leaking of fluid and fetal movement were reviewed in detail with  the patient. Please refer to After Visit Summary for other counseling recommendations.   Return in about 4 weeks (around 04/27/2024) for Routine prenatal care.  Future Appointments  Date Time Provider Department Center  04/26/2024 10:00 AM [facility] PROVIDER 1 [facility]  04/26/2024 10:30 AM [facility] US2 [facility]  04/27/2024  8:15 AM Nicholaus Burnard HERO, MD [facility]  04/27/2024  8:50 AM [facility] LAB [facility]  05/11/2024  3:55 PM Nicholaus Burnard HERO, MD [facility]    Suzen Maryan Masters, MD

## 2024-04-22 ENCOUNTER — Other Ambulatory Visit: Payer: Self-pay

## 2024-04-22 DIAGNOSIS — Z3A27 27 weeks gestation of pregnancy: Secondary | ICD-10-CM

## 2024-04-26 ENCOUNTER — Ambulatory Visit

## 2024-04-27 ENCOUNTER — Ambulatory Visit (HOSPITAL_BASED_OUTPATIENT_CLINIC_OR_DEPARTMENT_OTHER)

## 2024-04-27 ENCOUNTER — Ambulatory Visit: Admitting: Obstetrics and Gynecology

## 2024-04-27 ENCOUNTER — Other Ambulatory Visit

## 2024-04-27 ENCOUNTER — Ambulatory Visit: Attending: Obstetrics | Admitting: Obstetrics

## 2024-04-27 ENCOUNTER — Other Ambulatory Visit: Payer: Self-pay | Admitting: *Deleted

## 2024-04-27 ENCOUNTER — Other Ambulatory Visit: Payer: Self-pay

## 2024-04-27 VITALS — BP 106/72 | HR 92 | Wt 147.1 lb

## 2024-04-27 DIAGNOSIS — O09899 Supervision of other high risk pregnancies, unspecified trimester: Secondary | ICD-10-CM

## 2024-04-27 DIAGNOSIS — O3663X0 Maternal care for excessive fetal growth, third trimester, not applicable or unspecified: Secondary | ICD-10-CM | POA: Diagnosis not present

## 2024-04-27 DIAGNOSIS — O283 Abnormal ultrasonic finding on antenatal screening of mother: Secondary | ICD-10-CM | POA: Diagnosis not present

## 2024-04-27 DIAGNOSIS — Z3A28 28 weeks gestation of pregnancy: Secondary | ICD-10-CM | POA: Insufficient documentation

## 2024-04-27 DIAGNOSIS — Z362 Encounter for other antenatal screening follow-up: Secondary | ICD-10-CM | POA: Diagnosis not present

## 2024-04-27 DIAGNOSIS — O09213 Supervision of pregnancy with history of pre-term labor, third trimester: Secondary | ICD-10-CM | POA: Insufficient documentation

## 2024-04-27 DIAGNOSIS — Z1332 Encounter for screening for maternal depression: Secondary | ICD-10-CM

## 2024-04-27 DIAGNOSIS — O09893 Supervision of other high risk pregnancies, third trimester: Secondary | ICD-10-CM

## 2024-04-27 DIAGNOSIS — O0993 Supervision of high risk pregnancy, unspecified, third trimester: Secondary | ICD-10-CM

## 2024-04-27 DIAGNOSIS — Z87898 Personal history of other specified conditions: Secondary | ICD-10-CM | POA: Diagnosis not present

## 2024-04-27 DIAGNOSIS — O0990 Supervision of high risk pregnancy, unspecified, unspecified trimester: Secondary | ICD-10-CM

## 2024-04-27 DIAGNOSIS — Z3A27 27 weeks gestation of pregnancy: Secondary | ICD-10-CM

## 2024-04-27 DIAGNOSIS — Z23 Encounter for immunization: Secondary | ICD-10-CM

## 2024-04-27 DIAGNOSIS — O26873 Cervical shortening, third trimester: Secondary | ICD-10-CM

## 2024-04-27 DIAGNOSIS — O358XX0 Maternal care for other (suspected) fetal abnormality and damage, not applicable or unspecified: Secondary | ICD-10-CM | POA: Insufficient documentation
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
## 2024-04-27 NOTE — Progress Notes (Signed)
 MFM Consult Note  Erin Price is currently at 28 weeks and 2 days.  She has been followed due to a prior preterm birth at 35+ weeks.    She denies any problems since her last exam.  She was just screened for gestational diabetes earlier today.  On today's exam, the overall EFW at 3 pounds 4 ounces measures at the 92nd percentile for her gestational age.    There was normal amniotic fluid noted with a total AFI of 12.58 cm.    Due to the large for gestational age fetus noted today, a follow-up growth scan was scheduled in 8 weeks.   The patient stated that all of her questions were answered today.  A total of 10 minutes was spent counseling and coordinating the care for this patient.  Greater than 50% of the time was spent in direct face-to-face contact.

## 2024-04-28 LAB — CBC
Hematocrit: 34.3 % (ref 34.0–46.6)
Hemoglobin: 11.3 g/dL (ref 11.1–15.9)
MCH: 31.4 pg (ref 26.6–33.0)
MCHC: 32.9 g/dL (ref 31.5–35.7)
MCV: 95 fL (ref 79–97)
Platelets: 203 x10E3/uL (ref 150–450)
RBC: 3.6 x10E6/uL — ABNORMAL LOW (ref 3.77–5.28)
RDW: 12.5 % (ref 11.7–15.4)
WBC: 9.2 x10E3/uL (ref 3.4–10.8)

## 2024-04-28 LAB — GLUCOSE TOLERANCE, 2 HOURS W/ 1HR
Glucose, 1 hour: 122 mg/dL (ref 70–179)
Glucose, 2 hour: 55 mg/dL — ABNORMAL LOW (ref 70–152)
Glucose, Fasting: 70 mg/dL (ref 70–91)

## 2024-04-28 LAB — RPR: RPR Ser Ql: NONREACTIVE

## 2024-04-28 LAB — HIV ANTIBODY (ROUTINE TESTING W REFLEX): HIV Screen 4th Generation wRfx: NONREACTIVE

## 2024-04-29 ENCOUNTER — Ambulatory Visit: Payer: Self-pay | Admitting: Obstetrics and Gynecology

## 2024-05-11 ENCOUNTER — Encounter: Admitting: Obstetrics and Gynecology

## 2024-05-26 ENCOUNTER — Other Ambulatory Visit: Payer: Self-pay

## 2024-05-26 ENCOUNTER — Ambulatory Visit: Admitting: Obstetrics and Gynecology

## 2024-05-26 VITALS — BP 117/71 | HR 96 | Wt 155.3 lb

## 2024-05-26 DIAGNOSIS — O09893 Supervision of other high risk pregnancies, third trimester: Secondary | ICD-10-CM | POA: Diagnosis not present

## 2024-05-26 DIAGNOSIS — O09899 Supervision of other high risk pregnancies, unspecified trimester: Secondary | ICD-10-CM

## 2024-05-26 DIAGNOSIS — O0993 Supervision of high risk pregnancy, unspecified, third trimester: Secondary | ICD-10-CM

## 2024-05-26 DIAGNOSIS — Z3A32 32 weeks gestation of pregnancy: Secondary | ICD-10-CM | POA: Diagnosis not present

## 2024-05-26 DIAGNOSIS — O0990 Supervision of high risk pregnancy, unspecified, unspecified trimester: Secondary | ICD-10-CM

## 2024-05-26 NOTE — Progress Notes (Signed)
   PRENATAL VISIT NOTE  Subjective:  Erin Price is a 33 y.o. H4E6895 at [redacted]w[redacted]d being seen today for ongoing prenatal care.  She is currently monitored for the following issues for this high-risk pregnancy and has Supervision of high risk pregnancy, antepartum; History of preterm delivery, currently pregnant; Echogenic intracardiac focus of fetus on prenatal ultrasound; and [redacted] weeks gestation of pregnancy on their problem list.  Patient doing well with no acute concerns today. She reports occasional contractions.  Contractions: Irritability. Vag. Bleeding: None.  Movement: Present. Denies leaking of fluid.   The following portions of the patient's history were reviewed and updated as appropriate: allergies, current medications, past family history, past medical history, past social history, past surgical history and problem list. Problem list updated.  Objective:   Vitals:   05/26/24 1614  BP: 117/71  Pulse: 96  Weight: 155 lb 4.8 oz (70.4 kg)    Fetal Status: Fetal Heart Rate (bpm): 141 Fundal Height: 33 cm Movement: Present     General:  Alert, oriented and cooperative. Patient is in no acute distress.  Skin: Skin is warm and dry. No rash noted.   Cardiovascular: Normal heart rate noted  Respiratory: Normal respiratory effort, no problems with respiration noted  Abdomen: Soft, gravid, appropriate for gestational age.  Pain/Pressure: Present     Pelvic: Cervical exam deferred        Extremities: Normal range of motion.  Edema: Trace  Mental Status:  Normal mood and affect. Normal behavior. Normal judgment and thought content.   Assessment and Plan:  Pregnancy: [id number] at [redacted]w[redacted]d  1. [redacted] weeks gestation of pregnancy (Primary)   2. Supervision of high risk pregnancy, antepartum Continue routine prenatal care  3. History of preterm delivery, currently pregnant No s/sx of preterm contractions  Preterm labor symptoms and general obstetric precautions including but not limited  to vaginal bleeding, contractions, leaking of fluid and fetal movement were reviewed in detail with the patient.  Please refer to After Visit Summary for other counseling recommendations.   Return in about 2 weeks (around 06/09/2024) for [facility], in person.   Jerilynn Buddle, MD Faculty Attending Center for [facility]

## 2024-06-01 ENCOUNTER — Inpatient Hospital Stay (HOSPITAL_COMMUNITY)
Admission: AD | Admit: 2024-06-01 | Discharge: 2024-06-01 | Disposition: A | Discharge status: Home / Self Care | Attending: Obstetrics and Gynecology | Admitting: Obstetrics and Gynecology

## 2024-06-01 ENCOUNTER — Encounter (HOSPITAL_COMMUNITY): Payer: Self-pay | Admitting: Obstetrics and Gynecology

## 2024-06-01 DIAGNOSIS — O26893 Other specified pregnancy related conditions, third trimester: Secondary | ICD-10-CM

## 2024-06-01 DIAGNOSIS — O98513 Other viral diseases complicating pregnancy, third trimester: Secondary | ICD-10-CM | POA: Diagnosis not present

## 2024-06-01 DIAGNOSIS — Z3A33 33 weeks gestation of pregnancy: Secondary | ICD-10-CM

## 2024-06-01 DIAGNOSIS — O218 Other vomiting complicating pregnancy: Secondary | ICD-10-CM | POA: Insufficient documentation

## 2024-06-01 DIAGNOSIS — A084 Viral intestinal infection, unspecified: Secondary | ICD-10-CM

## 2024-06-01 LAB — COMPREHENSIVE METABOLIC PANEL WITH GFR
ALT: 20 U/L (ref 0–44)
AST: 22 U/L (ref 15–41)
Albumin: 2.8 g/dL — ABNORMAL LOW (ref 3.5–5.0)
Alkaline Phosphatase: 89 U/L (ref 38–126)
Anion gap: 12 (ref 5–15)
BUN: 7 mg/dL (ref 6–20)
CO2: 18 mmol/L — ABNORMAL LOW (ref 22–32)
Calcium: 8.3 mg/dL — ABNORMAL LOW (ref 8.9–10.3)
Chloride: 105 mmol/L (ref 98–111)
Creatinine, Ser: 0.53 mg/dL (ref 0.44–1.00)
GFR, Estimated: >60 mL/min (ref >60)
Glucose, Bld: 95 mg/dL (ref 70–99)
Potassium: 3.3 mmol/L — ABNORMAL LOW (ref 3.5–5.1)
Sodium: 135 mmol/L (ref 135–145)
Total Bilirubin: 1.1 mg/dL (ref 0.0–1.2)
Total Protein: 6.3 g/dL — ABNORMAL LOW (ref 6.5–8.1)

## 2024-06-01 LAB — URINALYSIS, ROUTINE W REFLEX MICROSCOPIC
Bilirubin Urine: NEGATIVE
Glucose, UA: NEGATIVE mg/dL
Ketones, ur: 80 mg/dL — AB
Leukocytes,Ua: NEGATIVE
Nitrite: NEGATIVE
Protein, ur: 30 mg/dL — AB
Specific Gravity, Urine: 1.024 (ref 1.005–1.030)
pH: 5 (ref 5.0–8.0)

## 2024-06-01 LAB — CBC
HCT: 34.6 % — ABNORMAL LOW (ref 36.0–46.0)
Hemoglobin: 11.9 g/dL — ABNORMAL LOW (ref 12.0–15.0)
MCH: 31.4 pg (ref 26.0–34.0)
MCHC: 34.4 g/dL (ref 30.0–36.0)
MCV: 91.3 fL (ref 80.0–100.0)
Platelets: 214 K/uL (ref 150–400)
RBC: 3.79 MIL/uL — ABNORMAL LOW (ref 3.87–5.11)
RDW: 12.6 % (ref 11.5–15.5)
WBC: 12.7 K/uL — ABNORMAL HIGH (ref 4.0–10.5)
nRBC: 0 % (ref 0.0–0.2)

## 2024-06-01 MED ORDER — PROMETHAZINE HCL 25 MG RE SUPP
25.0000 mg | Freq: Four times a day (QID) | RECTAL | 0 refills | Status: DC | PRN
Start: 2024-06-01 — End: 2024-06-23

## 2024-06-01 MED ORDER — PROMETHAZINE HCL 25 MG/ML IJ SOLN
25.0000 mg | Freq: Once | INTRAVENOUS | Status: AC
  Administered 2024-06-01: 25 mg via INTRAVENOUS
  Filled 2024-06-01: qty 1

## 2024-06-01 MED ORDER — ONDANSETRON 4 MG PO TBDP: 4.0000 mg | ORAL_TABLET | Freq: Four times a day (QID) | ORAL | 0 refills | Status: DC | PRN

## 2024-06-01 MED ORDER — SODIUM CHLORIDE 0.9 % IV SOLN
8.0000 mg | Freq: Once | INTRAVENOUS | Status: AC
  Administered 2024-06-01: 8 mg via INTRAVENOUS
  Filled 2024-06-01: qty 4

## 2024-06-01 MED ORDER — PROMETHAZINE HCL 25 MG PO TABS
25.0000 mg | ORAL_TABLET | Freq: Four times a day (QID) | ORAL | 0 refills | Status: DC | PRN
Start: 2024-06-01 — End: 2024-06-23
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
## 2024-06-01 NOTE — MAU Note (Signed)
 MAU Triage Note:   .Erin Price is a 33 y.o. at [redacted]w[redacted]d here in MAU reporting: woke up around 0300 this morning with nausea and emesis - has been unable to tolerate PO solids/fluids since yesterday. She is unsure if it is a virus as her daughter was sent home from school today due to vomiting. She then started having lower abdominal cramping around 1400 that is described as a heavy period cramp. She denies VB or LOF. Does report +FM.  Patient complaint: Throwing up, cramping, CTX, dehydrated  Pain Score: 9  Pain Location: Abdomen Pain Score: 5 Pain Location: Head   Onset of complaint: this AM LMP: Patient's last menstrual period was 09/21/2023.  Vitals:   06/01/24 2110  BP: 114/66  Pulse: (!) 115  Resp: 18  Temp: 98.2 F (36.8 C)  SpO2: 99%    FHT:  Fetal Heart Rate Mode: External Baseline Rate (A): 190 bpm Lab orders placed from triage: UA

## 2024-06-02 LAB — LIPASE, BLOOD: Lipase: 19 U/L (ref 11–51)

## 2024-06-08 ENCOUNTER — Other Ambulatory Visit: Payer: Self-pay

## 2024-06-08 ENCOUNTER — Encounter: Payer: Self-pay | Admitting: Obstetrics and Gynecology

## 2024-06-08 ENCOUNTER — Ambulatory Visit: Admitting: Obstetrics and Gynecology

## 2024-06-08 VITALS — BP 106/73 | HR 92 | Wt 157.0 lb

## 2024-06-08 DIAGNOSIS — O0993 Supervision of high risk pregnancy, unspecified, third trimester: Secondary | ICD-10-CM | POA: Diagnosis not present

## 2024-06-08 DIAGNOSIS — O09893 Supervision of other high risk pregnancies, third trimester: Secondary | ICD-10-CM

## 2024-06-08 DIAGNOSIS — O283 Abnormal ultrasonic finding on antenatal screening of mother: Secondary | ICD-10-CM

## 2024-06-08 DIAGNOSIS — Z3A34 34 weeks gestation of pregnancy: Secondary | ICD-10-CM | POA: Diagnosis not present

## 2024-06-08 DIAGNOSIS — Z23 Encounter for immunization: Secondary | ICD-10-CM | POA: Diagnosis not present

## 2024-06-08 DIAGNOSIS — O3663X0 Maternal care for excessive fetal growth, third trimester, not applicable or unspecified: Secondary | ICD-10-CM

## 2024-06-08 DIAGNOSIS — O3660X0 Maternal care for excessive fetal growth, unspecified trimester, not applicable or unspecified: Secondary | ICD-10-CM | POA: Insufficient documentation

## 2024-06-08 DIAGNOSIS — O09899 Supervision of other high risk pregnancies, unspecified trimester: Secondary | ICD-10-CM

## 2024-06-08 DIAGNOSIS — O0990 Supervision of high risk pregnancy, unspecified, unspecified trimester: Secondary | ICD-10-CM

## 2024-06-08 NOTE — Progress Notes (Signed)
   PRENATAL VISIT NOTE  Subjective:  Erin Price is a 33 y.o. [id number] at [redacted]w[redacted]d being seen today for ongoing prenatal care.  She is currently monitored for the following issues for this high-risk pregnancy and has Supervision of high risk pregnancy, antepartum; History of preterm delivery, currently pregnant; Echogenic intracardiac focus of fetus on prenatal ultrasound; [redacted] weeks gestation of pregnancy; and LGA (large for gestational age) fetus affecting management of mother on their problem list.  Patient reports swollen feet.  Contractions: Irritability. Vag. Bleeding: None.  Movement: Present. Denies leaking of fluid.   The following portions of the patient's history were reviewed and updated as appropriate: allergies, current medications, past family history, past medical history, past social history, past surgical history and problem list.   Objective:    Vitals:   06/08/24 1107  BP: 106/73  Pulse: 92  Weight: 157 lb (71.2 kg)    Fetal Status:  Fetal Heart Rate (bpm): 150   Movement: Present    General: Alert, oriented and cooperative. Patient is in no acute distress.  Skin: Skin is warm and dry. No rash noted.   Cardiovascular: Normal heart rate noted  Respiratory: Normal respiratory effort, no problems with respiration noted  Abdomen: Soft, gravid, appropriate for gestational age.  Pain/Pressure: Present     Pelvic: Cervical exam deferred        Extremities: Normal range of motion.  Edema: Trace (feet)  Mental Status: Normal mood and affect. Normal behavior. Normal judgment and thought content.   Assessment and Plan:  Pregnancy: [id number] at [redacted]w[redacted]d  1. Supervision of high risk pregnancy, antepartum (Primary)  2. Influenza vaccine administered - Flu vaccine trivalent PF, 6mos and older(Flulaval,Afluria,Fluarix,Fluzone)  3. History of preterm delivery, currently pregnant  4. Echogenic intracardiac focus of fetus on prenatal ultrasound  5. [redacted] weeks gestation of  pregnancy  6. Excessive fetal growth affecting management of pregnancy in third trimester, single or unspecified fetus 92%tile last US  Has follow up on 9/30  Preterm labor symptoms and general obstetric precautions including but not limited to vaginal bleeding, contractions, leaking of fluid and fetal movement were reviewed in detail with the patient. Please refer to After Visit Summary for other counseling recommendations.   Return in about 2 weeks (around 06/22/2024) for high OB, 36 week swabs.  Future Appointments  Date Time Provider Department Center  06/22/2024 11:15 AM [facility] PROVIDER 1 [facility]  06/22/2024 11:30 AM [facility] US1 [facility]  06/25/2024  8:55 AM Ilean Norleen GAILS, MD [facility]  07/01/2024  8:55 AM Zina Jerilynn LABOR, MD [facility]  07/07/2024  1:55 PM Ilean Norleen GAILS, MD [facility]  07/16/2024  8:15 AM Ilean Norleen GAILS, MD [facility]  07/23/2024  8:15 AM Lola Donnice HERO, MD [facility]    Burnard HERO Moats, MD

## 2024-06-09 ENCOUNTER — Encounter: Admitting: Obstetrics and Gynecology

## 2024-06-16 ENCOUNTER — Encounter: Admitting: Obstetrics & Gynecology

## 2024-06-22 ENCOUNTER — Ambulatory Visit: Attending: Obstetrics and Gynecology | Admitting: Obstetrics

## 2024-06-22 ENCOUNTER — Ambulatory Visit

## 2024-06-22 VITALS — BP 121/73

## 2024-06-22 DIAGNOSIS — O358XX0 Maternal care for other (suspected) fetal abnormality and damage, not applicable or unspecified: Secondary | ICD-10-CM | POA: Diagnosis not present

## 2024-06-22 DIAGNOSIS — O0993 Supervision of high risk pregnancy, unspecified, third trimester: Secondary | ICD-10-CM

## 2024-06-22 DIAGNOSIS — O0990 Supervision of high risk pregnancy, unspecified, unspecified trimester: Secondary | ICD-10-CM

## 2024-06-22 DIAGNOSIS — Z3A36 36 weeks gestation of pregnancy: Secondary | ICD-10-CM

## 2024-06-22 DIAGNOSIS — O35BXX0 Maternal care for other (suspected) fetal abnormality and damage, fetal cardiac anomalies, not applicable or unspecified: Secondary | ICD-10-CM | POA: Diagnosis not present

## 2024-06-22 DIAGNOSIS — O09899 Supervision of other high risk pregnancies, unspecified trimester: Secondary | ICD-10-CM

## 2024-06-22 DIAGNOSIS — O09213 Supervision of pregnancy with history of pre-term labor, third trimester: Secondary | ICD-10-CM | POA: Diagnosis not present

## 2024-06-22 DIAGNOSIS — O99213 Obesity complicating pregnancy, third trimester: Secondary | ICD-10-CM | POA: Diagnosis not present

## 2024-06-22 DIAGNOSIS — Z362 Encounter for other antenatal screening follow-up: Secondary | ICD-10-CM | POA: Diagnosis present

## 2024-06-22 DIAGNOSIS — O283 Abnormal ultrasonic finding on antenatal screening of mother: Secondary | ICD-10-CM

## 2024-06-22 NOTE — Progress Notes (Signed)
 MFM Consult Note  Erin Price is currently at 36 weeks and 2 days.  She has been followed as a large for gestational age fetus was noted on her last exam.    She denies any problems since her last exam and has screened negative for gestational diabetes.  Sonographic findings Single intrauterine pregnancy at 36w 2d.  Fetal cardiac activity:  Observed and appears normal. Presentation: Cephalic. Interval fetal anatomy appears normal. Fetal biometry shows the estimated fetal weight of 6 pounds 1 ounces which measures at the 37th percentile. Amniotic fluid volume: Within normal limits. MVP: 4.77 cm. Placenta: Posterior.  There are limitations of prenatal ultrasound such as the inability to detect certain abnormalities due to poor visualization. Various factors such as fetal position, gestational age and maternal body habitus may increase the difficulty in visualizing the fetal anatomy.    As the fetal growth is within normal limits, she was advised to continue routine prenatal care.    No further exams were scheduled in our office.

## 2024-06-23 ENCOUNTER — Encounter (HOSPITAL_COMMUNITY): Payer: Self-pay | Admitting: Obstetrics and Gynecology

## 2024-06-23 ENCOUNTER — Other Ambulatory Visit: Payer: Self-pay

## 2024-06-23 ENCOUNTER — Inpatient Hospital Stay (HOSPITAL_COMMUNITY)
Admission: AD | Admit: 2024-06-23 | Discharge: 2024-06-23 | Disposition: A | Discharge status: Home / Self Care | Attending: Obstetrics and Gynecology | Admitting: Obstetrics and Gynecology

## 2024-06-23 DIAGNOSIS — Z3A36 36 weeks gestation of pregnancy: Secondary | ICD-10-CM

## 2024-06-23 DIAGNOSIS — O479 False labor, unspecified: Secondary | ICD-10-CM | POA: Diagnosis not present

## 2024-06-23 DIAGNOSIS — O4703 False labor before 37 completed weeks of gestation, third trimester: Secondary | ICD-10-CM | POA: Diagnosis present

## 2024-06-23 NOTE — MAU Provider Note (Signed)
 Erin Price is a 33 y.o. [phone] female at [redacted]w[redacted]d  RN Labor check, not seen by provider SVE by RN: Dilation: 3 Effacement (%): 70 Station: 0 Exam by:: Amy Sanseverino, RN (unchanged an hour apart) NST: FHR baseline 150s bpm, Variability: moderate, Accelerations:present, Decelerations:  Absent= Cat 1/Reactive Toco: seem to be q 2-5 mins initially, then spaced out  D/C home; keep appt on 10/3 or return for increased sigs of labor/ROM/vag bleeding  Suzen JONETTA Gentry CNM 06/23/2024 11:28 PM

## 2024-06-23 NOTE — MAU Note (Signed)
 Erin Price is a 33 y.o. at [redacted]w[redacted]d here in MAU reporting: CTX since this morning and increasing vaginal pressure. No c/o LOF or VB. Pt reports positive FM.   LMP:  Onset of complaint: This morning  Pain score: 10  FHT: Pt bi passed triage and placed directly in room.   Lab orders placed from triage: MAU labor

## 2024-06-23 NOTE — MAU Note (Signed)
 MAU Labor Evaluation RN Medical Screening Exam: RN Assessment:  .[name] is a 33 y.o. at [redacted]w[redacted]d here in MAU for evaluation of labor. See RN triage note.   Pain Score: 10-Worst pain ever Pain Location: Abdomen  Cervical exam:  Dilation: 3 Effacement (%): 70 Cervical Position: Posterior Station: 0 Presentation: Vertex Exam by:: Greig Sermon, RN  Fetal Monitoring: Baseline Rate (A): 145 bpm Variability: Moderate Accelerations: 15 x 15 Decelerations: Variable Contraction Frequency (min): none  Vitals:   06/23/24 2323  BP: 123/73  Pulse: 84  Resp: 16  Temp: 98.2 F (36.8 C)      Medical Decision Making & Provider Communication:  This RN has communicated with: Provider Name/Title: Luke Gelineau with MAU provider SBAR report of labor evaluation. Also reviewed contraction pattern and that non-stress test is reactive.  Contraction Frequency (min): none has been documented with no cervical change over 1 hours not indicating active labor.  Patient denies any other complaints.  Based on this report provider has given order for discharge. A discharge order and diagnosis entered by a provider. Labor discharge precautions reviewed with patient and all questions answered. Patient verbalized understanding.   Plan of Care: Discharge home with strict labor precautions

## 2024-06-25 ENCOUNTER — Other Ambulatory Visit: Payer: Self-pay

## 2024-06-25 ENCOUNTER — Encounter: Payer: Self-pay | Admitting: Family Medicine

## 2024-06-25 ENCOUNTER — Other Ambulatory Visit (HOSPITAL_COMMUNITY)
Admission: RE | Admit: 2024-06-25 | Discharge: 2024-06-25 | Disposition: A | Discharge status: Home / Self Care | Source: Ambulatory Visit | Attending: Family Medicine | Admitting: Family Medicine

## 2024-06-25 ENCOUNTER — Ambulatory Visit: Admitting: Family Medicine

## 2024-06-25 VITALS — BP 111/77 | HR 91 | Wt 157.1 lb

## 2024-06-25 DIAGNOSIS — O09893 Supervision of other high risk pregnancies, third trimester: Secondary | ICD-10-CM | POA: Diagnosis not present

## 2024-06-25 DIAGNOSIS — O09899 Supervision of other high risk pregnancies, unspecified trimester: Secondary | ICD-10-CM

## 2024-06-25 DIAGNOSIS — Z3A36 36 weeks gestation of pregnancy: Secondary | ICD-10-CM

## 2024-06-25 DIAGNOSIS — Z3483 Encounter for supervision of other normal pregnancy, third trimester: Secondary | ICD-10-CM | POA: Insufficient documentation

## 2024-06-25 DIAGNOSIS — O0993 Supervision of high risk pregnancy, unspecified, third trimester: Secondary | ICD-10-CM

## 2024-06-25 DIAGNOSIS — O3663X0 Maternal care for excessive fetal growth, third trimester, not applicable or unspecified: Secondary | ICD-10-CM

## 2024-06-25 DIAGNOSIS — O0990 Supervision of high risk pregnancy, unspecified, unspecified trimester: Secondary | ICD-10-CM

## 2024-06-25 DIAGNOSIS — O283 Abnormal ultrasonic finding on antenatal screening of mother: Secondary | ICD-10-CM

## 2024-06-25 NOTE — Progress Notes (Signed)
   PRENATAL VISIT NOTE  Subjective:  Erin Price is a 33 y.o. [id number] at [redacted]w[redacted]d being seen today for ongoing prenatal care.  She is currently monitored for the following issues for this high-risk pregnancy and has Supervision of high risk pregnancy, antepartum; History of preterm delivery, currently pregnant; Echogenic intracardiac focus of fetus on prenatal ultrasound; [redacted] weeks gestation of pregnancy; and LGA (large for gestational age) fetus affecting management of mother on their problem list.  Patient reports no bleeding, no cramping, no leaking, and occasional contractions.  Contractions: Irregular. Vag. Bleeding: None.  Movement: Present. Denies leaking of fluid.   The following portions of the patient's history were reviewed and updated as appropriate: allergies, current medications, past family history, past medical history, past social history, past surgical history and problem list.   Objective:    Vitals:   06/25/24 0853  BP: 111/77  Pulse: 91  Weight: 157 lb 1.6 oz (71.3 kg)    Fetal Status:  Fetal Heart Rate (bpm): 142   Movement: Present    General: Alert, oriented and cooperative. Patient is in no acute distress.  Skin: Skin is warm and dry. No rash noted.   Cardiovascular: Normal heart rate noted  Respiratory: Normal respiratory effort, no problems with respiration noted  Abdomen: Soft, gravid, appropriate for gestational age.  Pain/Pressure: Present (pressure)     Pelvic: Cervical exam performed in the presence of a chaperone      3.5/70/-3  Extremities: Normal range of motion.  Edema: Trace (feet)  Mental Status: Normal mood and affect. Normal behavior. Normal judgment and thought content.   Assessment and Plan:  Pregnancy: [id number] at [redacted]w[redacted]d 1. Supervision of high risk pregnancy, antepartum FHR BP appropriate today  2. History of preterm delivery, currently pregnant Cervical exam unchanged from when she was in the MAU  3. Echogenic intracardiac focus of  fetus on prenatal ultrasound  4. Excessive fetal growth affecting management of pregnancy in third trimester, single or unspecified fetus Resolved, initial ultrasound showing 92nd percentile.  Most recent on 9/30 showed 37th percentile.  5. [redacted] weeks gestation of pregnancy (Primary) - Culture, beta strep (group b only) - Cervicovaginal ancillary only( [place])  Preterm labor symptoms and general obstetric precautions including but not limited to vaginal bleeding, contractions, leaking of fluid and fetal movement were reviewed in detail with the patient. Please refer to After Visit Summary for other counseling recommendations.   No follow-ups on file.  Future Appointments  Date Time Provider Department Center  07/01/2024  8:55 AM Zina Jerilynn LABOR, MD [facility]  07/07/2024  1:55 PM Ilean Norleen GAILS, MD [facility]  07/16/2024  8:15 AM Ilean Norleen GAILS, MD [facility]  07/23/2024  8:15 AM Lola Donnice HERO, MD [facility]    Norleen GAILS Ilean, MD

## 2024-06-28 LAB — CERVICOVAGINAL ANCILLARY ONLY
Chlamydia: NEGATIVE
Comment: NEGATIVE
Comment: NORMAL
Neisseria Gonorrhea: NEGATIVE

## 2024-06-29 LAB — CULTURE, BETA STREP (GROUP B ONLY): Strep Gp B Culture: NEGATIVE

## 2024-07-01 ENCOUNTER — Ambulatory Visit: Admitting: Obstetrics and Gynecology

## 2024-07-01 ENCOUNTER — Other Ambulatory Visit: Payer: Self-pay

## 2024-07-01 VITALS — BP 119/79 | HR 94 | Wt 157.0 lb

## 2024-07-01 DIAGNOSIS — Z3A37 37 weeks gestation of pregnancy: Secondary | ICD-10-CM

## 2024-07-01 DIAGNOSIS — O0993 Supervision of high risk pregnancy, unspecified, third trimester: Secondary | ICD-10-CM | POA: Diagnosis not present

## 2024-07-01 DIAGNOSIS — O0990 Supervision of high risk pregnancy, unspecified, unspecified trimester: Secondary | ICD-10-CM

## 2024-07-01 DIAGNOSIS — O09899 Supervision of other high risk pregnancies, unspecified trimester: Secondary | ICD-10-CM

## 2024-07-01 DIAGNOSIS — O09893 Supervision of other high risk pregnancies, third trimester: Secondary | ICD-10-CM

## 2024-07-01 NOTE — Progress Notes (Signed)
   PRENATAL VISIT NOTE  Subjective:  Erin Price is a 33 y.o. H4E6895 at [redacted]w[redacted]d being seen today for ongoing prenatal care.  She is currently monitored for the following issues for this low-risk pregnancy and has Supervision of high risk pregnancy, antepartum; History of preterm delivery, currently pregnant; Echogenic intracardiac focus of fetus on prenatal ultrasound; [redacted] weeks gestation of pregnancy; and LGA (large for gestational age) fetus affecting management of mother on their problem list.  Patient doing well with no acute concerns today. She reports pelvic pressure.  Contractions: Irritability. Vag. Bleeding: Scant.  Movement: Present. Denies leaking of fluid.   The following portions of the patient's history were reviewed and updated as appropriate: allergies, current medications, past family history, past medical history, past social history, past surgical history and problem list. Problem list updated.  Objective:   Vitals:   07/01/24 0902  BP: 119/79  Pulse: 94  Weight: 157 lb (71.2 kg)    Fetal Status: Fetal Heart Rate (bpm): 151 Fundal Height: 37 cm Movement: Present     General:  Alert, oriented and cooperative. Patient is in no acute distress.  Skin: Skin is warm and dry. No rash noted.   Cardiovascular: Normal heart rate noted  Respiratory: Normal respiratory effort, no problems with respiration noted  Abdomen: Soft, gravid, appropriate for gestational age.  Pain/Pressure: Present     Pelvic: Cervical exam performed Dilation: 4 Effacement (%): 50 Station: -3  Extremities: Normal range of motion.  Edema: None  Mental Status:  Normal mood and affect. Normal behavior. Normal judgment and thought content.   Assessment and Plan:  Pregnancy: H4E6895 at [redacted]w[redacted]d  1. [redacted] weeks gestation of pregnancy (Primary)   2. Supervision of high risk pregnancy, antepartum Continue routine prenatal care  3. History of preterm delivery, currently pregnant Pelvic pressure, no  contractions  Term labor symptoms and general obstetric precautions including but not limited to vaginal bleeding, contractions, leaking of fluid and fetal movement were reviewed in detail with the patient.  Please refer to After Visit Summary for other counseling recommendations.   Return in about 1 week (around 07/08/2024) for ROB, in person.   Jerilynn Buddle, MD Faculty Attending Center for [facility]

## 2024-07-07 ENCOUNTER — Inpatient Hospital Stay (HOSPITAL_COMMUNITY)
Admission: AD | Admit: 2024-07-07 | Discharge: 2024-07-09 | DRG: 807 | Disposition: A | Discharge status: Home / Self Care | Payer: Self-pay | Attending: Obstetrics and Gynecology | Admitting: Obstetrics and Gynecology

## 2024-07-07 ENCOUNTER — Other Ambulatory Visit: Payer: Self-pay

## 2024-07-07 ENCOUNTER — Ambulatory Visit: Admitting: Family Medicine

## 2024-07-07 ENCOUNTER — Encounter (HOSPITAL_COMMUNITY): Payer: Self-pay | Admitting: Obstetrics and Gynecology

## 2024-07-07 VITALS — BP 119/79 | HR 92 | Wt 157.2 lb

## 2024-07-07 DIAGNOSIS — O0990 Supervision of high risk pregnancy, unspecified, unspecified trimester: Secondary | ICD-10-CM

## 2024-07-07 DIAGNOSIS — O3663X0 Maternal care for excessive fetal growth, third trimester, not applicable or unspecified: Secondary | ICD-10-CM | POA: Diagnosis present

## 2024-07-07 DIAGNOSIS — Z8249 Family history of ischemic heart disease and other diseases of the circulatory system: Secondary | ICD-10-CM | POA: Diagnosis not present

## 2024-07-07 DIAGNOSIS — O09893 Supervision of other high risk pregnancies, third trimester: Secondary | ICD-10-CM

## 2024-07-07 DIAGNOSIS — O283 Abnormal ultrasonic finding on antenatal screening of mother: Secondary | ICD-10-CM

## 2024-07-07 DIAGNOSIS — O09899 Supervision of other high risk pregnancies, unspecified trimester: Secondary | ICD-10-CM

## 2024-07-07 DIAGNOSIS — O0993 Supervision of high risk pregnancy, unspecified, third trimester: Secondary | ICD-10-CM

## 2024-07-07 DIAGNOSIS — O26893 Other specified pregnancy related conditions, third trimester: Secondary | ICD-10-CM | POA: Diagnosis present

## 2024-07-07 DIAGNOSIS — Z3A38 38 weeks gestation of pregnancy: Secondary | ICD-10-CM | POA: Diagnosis not present

## 2024-07-07 DIAGNOSIS — Z833 Family history of diabetes mellitus: Secondary | ICD-10-CM | POA: Diagnosis not present

## 2024-07-07 LAB — CBC
HCT: 38.2 % (ref 36.0–46.0)
Hemoglobin: 13.1 g/dL (ref 12.0–15.0)
MCH: 31.6 pg (ref 26.0–34.0)
MCHC: 34.3 g/dL (ref 30.0–36.0)
MCV: 92 fL (ref 80.0–100.0)
Platelets: 237 K/uL (ref 150–400)
RBC: 4.15 MIL/uL (ref 3.87–5.11)
RDW: 13.1 % (ref 11.5–15.5)
WBC: 18 K/uL — ABNORMAL HIGH (ref 4.0–10.5)
nRBC: 0 % (ref 0.0–0.2)

## 2024-07-07 MED ORDER — OXYCODONE-ACETAMINOPHEN 5-325 MG PO TABS: 1.0000 | ORAL_TABLET | ORAL | Status: DC | PRN

## 2024-07-07 MED ORDER — FENTANYL CITRATE (PF) 100 MCG/2ML IJ SOLN
100.0000 ug | Freq: Once | INTRAMUSCULAR | Status: AC
  Administered 2024-07-07: 100 ug via INTRAVENOUS

## 2024-07-07 MED ORDER — LACTATED RINGERS IV SOLN: 500.0000 mL | INTRAVENOUS | Status: AC | PRN

## 2024-07-07 MED ORDER — SOD CITRATE-CITRIC ACID 500-334 MG/5ML PO SOLN: 30.0000 mL | ORAL | Status: DC | PRN

## 2024-07-07 MED ORDER — PHENYLEPHRINE 80 MCG/ML (10ML) SYRINGE FOR IV PUSH (FOR BLOOD PRESSURE SUPPORT): 80.0000 ug | PREFILLED_SYRINGE | INTRAVENOUS | Status: DC | PRN

## 2024-07-07 MED ORDER — ONDANSETRON HCL 4 MG/2ML IJ SOLN: 4.0000 mg | Freq: Four times a day (QID) | INTRAMUSCULAR | Status: DC | PRN

## 2024-07-07 MED ORDER — OXYTOCIN BOLUS FROM INFUSION: 333.0000 mL | Freq: Once | INTRAVENOUS | Status: AC

## 2024-07-07 MED ORDER — OXYTOCIN-SODIUM CHLORIDE 30-0.9 UT/500ML-% IV SOLN: 2.5000 [IU]/h | INTRAVENOUS | Status: DC

## 2024-07-07 MED ORDER — LIDOCAINE HCL (PF) 1 % IJ SOLN: 30.0000 mL | INTRAMUSCULAR | Status: DC | PRN

## 2024-07-07 MED ORDER — EPHEDRINE 5 MG/ML INJ: 10.0000 mg | INTRAVENOUS | Status: DC | PRN

## 2024-07-07 MED ORDER — FENTANYL CITRATE (PF) 100 MCG/2ML IJ SOLN
INTRAMUSCULAR | Status: AC
  Filled 2024-07-07: qty 2

## 2024-07-07 MED ORDER — OXYTOCIN-SODIUM CHLORIDE 30-0.9 UT/500ML-% IV SOLN
INTRAVENOUS | Status: AC
  Administered 2024-07-07: 333 mL via INTRAVENOUS
  Filled 2024-07-07: qty 500

## 2024-07-07 MED ORDER — LACTATED RINGERS IV SOLN
500.0000 mL | Freq: Once | INTRAVENOUS | Status: AC
Start: 2024-07-07 — End: ?

## 2024-07-07 MED ORDER — DIPHENHYDRAMINE HCL 50 MG/ML IJ SOLN: 12.5000 mg | INTRAMUSCULAR | Status: DC | PRN

## 2024-07-07 MED ORDER — LACTATED RINGERS IV SOLN: INTRAVENOUS | Status: AC

## 2024-07-07 MED ORDER — FENTANYL-BUPIVACAINE-NACL 0.5-0.125-0.9 MG/250ML-% EP SOLN: 12.0000 mL/h | EPIDURAL | Status: DC | PRN

## 2024-07-07 MED ORDER — PHENYLEPHRINE 80 MCG/ML (10ML) SYRINGE FOR IV PUSH (FOR BLOOD PRESSURE SUPPORT)
80.0000 ug | PREFILLED_SYRINGE | INTRAVENOUS | Status: AC | PRN
Start: 2024-07-07 — End: ?

## 2024-07-07 MED ORDER — ACETAMINOPHEN 325 MG PO TABS
650.0000 mg | ORAL_TABLET | ORAL | Status: DC | PRN
  Filled 2024-07-07: qty 2

## 2024-07-07 MED ORDER — FLEET ENEMA RE ENEM: 1.0000 | ENEMA | RECTAL | Status: DC | PRN

## 2024-07-07 MED ORDER — OXYCODONE-ACETAMINOPHEN 5-325 MG PO TABS: 2.0000 | ORAL_TABLET | ORAL | Status: DC | PRN

## 2024-07-07 NOTE — MAU Note (Signed)
 MAU Labor Triage Note:  .Erin Price is a 33 y.o. at [redacted]w[redacted]d here in MAU reporting:  Contractions every: 2-3 minutes Onset of ctx: 6pm    ROM: intact Vaginal Bleeding: bloody show Last SVE: 5.5cm earlier today Labor Pain Management Plan: Planning epidural  GBS: Negative  Fetal Movement: Reports positive FM FHT: Fetal Heart Rate Mode: External Baseline Rate (A): 135 bpm  Vitals:   07/07/24 2214  BP: 115/84  Pulse: 84  Resp: 19  Temp: 98.4 F (36.9 C)  SpO2: 100%      Lab orders placed from triage: MAU Labor Eval OB Office: Faculty

## 2024-07-07 NOTE — MAU Note (Signed)
 Patient in process of being transferred to L&D when she states I have to push, the baby's head is out. Patient brought back to room 130 and viable female infant delivered by Dr. Magali.

## 2024-07-08 ENCOUNTER — Encounter (HOSPITAL_COMMUNITY): Payer: Self-pay | Admitting: Obstetrics and Gynecology

## 2024-07-08 ENCOUNTER — Other Ambulatory Visit: Payer: Self-pay

## 2024-07-08 LAB — TYPE AND SCREEN: ABO/RH(D): O POS

## 2024-07-08 LAB — RPR: RPR Ser Ql: NONREACTIVE

## 2024-07-08 MED ORDER — SENNOSIDES-DOCUSATE SODIUM 8.6-50 MG PO TABS
2.0000 | ORAL_TABLET | ORAL | Status: DC
  Administered 2024-07-08: 2 via ORAL
  Filled 2024-07-08 (×2): qty 2

## 2024-07-08 MED ORDER — SODIUM CHLORIDE 0.9% FLUSH
3.0000 mL | INTRAVENOUS | Status: DC | PRN
  Administered 2024-07-08: 3 mL via INTRAVENOUS

## 2024-07-08 MED ORDER — DIBUCAINE (PERIANAL) 1 % EX OINT: 1.0000 | TOPICAL_OINTMENT | CUTANEOUS | Status: DC | PRN

## 2024-07-08 MED ORDER — ONDANSETRON HCL 4 MG PO TABS: 4.0000 mg | ORAL_TABLET | ORAL | Status: DC | PRN

## 2024-07-08 MED ORDER — BENZOCAINE-MENTHOL 20-0.5 % EX AERO: 1.0000 | INHALATION_SPRAY | CUTANEOUS | Status: DC | PRN

## 2024-07-08 MED ORDER — WITCH HAZEL-GLYCERIN EX PADS: 1.0000 | MEDICATED_PAD | CUTANEOUS | Status: DC | PRN

## 2024-07-08 MED ORDER — TETANUS-DIPHTH-ACELL PERTUSSIS 5-2-15.5 LF-MCG/0.5 IM SUSP: 0.5000 mL | Freq: Once | INTRAMUSCULAR | Status: DC

## 2024-07-08 MED ORDER — PRENATAL MULTIVITAMIN CH
1.0000 | ORAL_TABLET | Freq: Every day | ORAL | Status: DC
  Administered 2024-07-08 – 2024-07-09 (×2): 1 via ORAL
  Filled 2024-07-08 (×2): qty 1

## 2024-07-08 MED ORDER — SIMETHICONE 80 MG PO CHEW: 80.0000 mg | CHEWABLE_TABLET | ORAL | Status: DC | PRN

## 2024-07-08 MED ORDER — SODIUM CHLORIDE 0.9 % IV SOLN: 250.0000 mL | INTRAVENOUS | Status: DC | PRN

## 2024-07-08 MED ORDER — DIPHENHYDRAMINE HCL 25 MG PO CAPS: 25.0000 mg | ORAL_CAPSULE | Freq: Four times a day (QID) | ORAL | Status: DC | PRN

## 2024-07-08 MED ORDER — ONDANSETRON HCL 4 MG/2ML IJ SOLN: 4.0000 mg | INTRAMUSCULAR | Status: DC | PRN

## 2024-07-08 MED ORDER — ACETAMINOPHEN 325 MG PO TABS
650.0000 mg | ORAL_TABLET | ORAL | Status: DC | PRN
  Administered 2024-07-09: 650 mg via ORAL

## 2024-07-08 MED ORDER — COCONUT OIL OIL: 1.0000 | TOPICAL_OIL | Status: DC | PRN

## 2024-07-08 MED ORDER — OXYCODONE HCL 5 MG PO TABS: 5.0000 mg | ORAL_TABLET | Freq: Four times a day (QID) | ORAL | Status: DC | PRN

## 2024-07-08 MED ORDER — SODIUM CHLORIDE 0.9% FLUSH: 3.0000 mL | Freq: Two times a day (BID) | INTRAVENOUS | Status: DC

## 2024-07-08 MED ORDER — MEASLES, MUMPS & RUBELLA VAC ~~LOC~~ SUSR
0.5000 mL | Freq: Once | SUBCUTANEOUS | Status: DC
Start: 2024-07-09 — End: 2024-07-08

## 2024-07-08 MED ORDER — IBUPROFEN 800 MG PO TABS
800.0000 mg | ORAL_TABLET | Freq: Three times a day (TID) | ORAL | Status: DC
  Administered 2024-07-08 – 2024-07-09 (×5): 800 mg via ORAL
  Filled 2024-07-08 (×5): qty 1

## 2024-07-08 NOTE — Lactation Note (Signed)
 This note was copied from a baby's chart. Lactation Consultation Note  Patient Name: Erin Price Unijb'd Date: 07/08/2024 Age:33 hours Reason for consult: Initial assessment;Early term 37-38.6wks  P5. Mom has 2 teen agers then a 5 and 70 yr old. Mom BF all of them.  Mom stated this baby is BF good and he latches well but he will feed then come off and rest then want to go back on the breast. Mom stated she is going to be mainly BF but she will give him some formula until her milk comes in.  Discussed importance of colostrum. Mom stated the baby on every ultrasound the baby was sucking his thumb and he has been fussy because she covered his thumbs and he can't suck on them.  Newborn feeding habits, behavior, STS, I&O, supplementation reviewed. Mom encouraged to feed baby 8-12 times/24 hours and with feeding cues.  Encouraged mom to call for assistance or questions. Maternal Data Does the patient have breastfeeding experience prior to this delivery?: Yes How long did the patient breastfeed?: 4-5 months each, youngest 33 yrs old  Feeding Nipple Type: Slow - flow  LATCH Score Latch: Grasps breast easily, tongue down, lips flanged, rhythmical sucking.  Audible Swallowing: A few with stimulation  Type of Nipple: Everted at rest and after stimulation  Comfort (Breast/Nipple): Soft / non-tender  Hold (Positioning): No assistance needed to correctly position infant at breast.  LATCH Score: 9   Lactation Tools Discussed/Used    Interventions Interventions: Breast feeding basics reviewed;Skin to skin;Education;LC Services brochure  Discharge Pump: DEBP  Consult Status Consult Status: Follow-up Date: 07/09/24 Follow-up type: In-patient    Zarius Furr G 07/08/2024, 3:07 AM

## 2024-07-08 NOTE — Progress Notes (Signed)
 POSTPARTUM PROGRESS NOTE  Post partum Day 1 Subjective:  Erin Price is a 33 y.o. H4E5894 [redacted]w[redacted]d s/p nsvd.  No acute events overnight.  Pt denies problems with ambulating, voiding or po intake.  She denies nausea or vomiting.  Pain is well controlled.  She has had flatus. She has not had bowel movement.  Lochia Small.   Objective: Blood pressure 107/69, pulse 72, temperature 99 F (37.2 C), resp. rate 18, height 5' 1 (1.549 m), weight 71.1 kg, last menstrual period 09/21/2023, SpO2 99%, unknown if currently breastfeeding.  Physical Exam:  General: alert, cooperative and no distress Lochia:normal flow Chest: CTAB Heart: RRR no m/r/g Abdomen: +BS, soft, nontender,  Uterine Fundus: firm DVT Evaluation: No calf swelling or tenderness Extremities: no edema  Recent Labs    07/07/24 2310  HGB 13.1  HCT 38.2    Assessment/Plan:  ASSESSMENT: Erin Price is a 33 y.o. H4E5894 [redacted]w[redacted]d s/p nsvd, doing well. Bleeding appropriate, no lacerations, breast and bottle feeding, considering depo for birth control, consented for circ today, plan for d/c tomorrow    LOS: 1 day   Devaughn KATHEE Ban 07/08/2024, 10:02 AM

## 2024-07-08 NOTE — Plan of Care (Signed)
  Problem: Education: Goal: Knowledge of Childbirth will improve Outcome: Progressing Goal: Ability to make informed decisions regarding treatment and plan of care will improve Outcome: Progressing Goal: Ability to state and carry out methods to decrease the pain will improve Outcome: Progressing Goal: Individualized Educational Video(s) Outcome: Progressing   Problem: Coping: Goal: Ability to verbalize concerns and feelings about labor and delivery will improve Outcome: Progressing   Problem: Life Cycle: Goal: Ability to make normal progression through stages of labor will improve Outcome: Progressing Goal: Ability to effectively push during vaginal delivery will improve Outcome: Progressing   Problem: Role Relationship: Goal: Will demonstrate positive interactions with the child Outcome: Progressing   Problem: Safety: Goal: Risk of complications during labor and delivery will decrease Outcome: Progressing   Problem: Pain Management: Goal: Relief or control of pain from uterine contractions will improve Outcome: Progressing   Problem: Education: Goal: Knowledge of condition will improve Outcome: Progressing Goal: Individualized Educational Video(s) Outcome: Progressing Goal: Individualized Newborn Educational Video(s) Outcome: Progressing   Problem: Activity: Goal: Will verbalize the importance of balancing activity with adequate rest periods Outcome: Progressing Goal: Ability to tolerate increased activity will improve Outcome: Progressing   Problem: Coping: Goal: Ability to identify and utilize available resources and services will improve Outcome: Progressing   Problem: Life Cycle: Goal: Chance of risk for complications during the postpartum period will decrease Outcome: Progressing   Problem: Role Relationship: Goal: Ability to demonstrate positive interaction with newborn will improve Outcome: Progressing   Problem: Skin Integrity: Goal: Demonstration  of wound healing without infection will improve Outcome: Progressing   Problem: Education: Goal: Knowledge of General Education information will improve Description: Including pain rating scale, medication(s)/side effects and non-pharmacologic comfort measures Outcome: Progressing   Problem: Health Behavior/Discharge Planning: Goal: Ability to manage health-related needs will improve Outcome: Progressing   Problem: Clinical Measurements: Goal: Ability to maintain clinical measurements within normal limits will improve Outcome: Progressing Goal: Will remain free from infection Outcome: Progressing Goal: Diagnostic test results will improve Outcome: Progressing Goal: Respiratory complications will improve Outcome: Progressing Goal: Cardiovascular complication will be avoided Outcome: Progressing   Problem: Activity: Goal: Risk for activity intolerance will decrease Outcome: Progressing   Problem: Nutrition: Goal: Adequate nutrition will be maintained Outcome: Progressing   Problem: Coping: Goal: Level of anxiety will decrease Outcome: Progressing   Problem: Elimination: Goal: Will not experience complications related to bowel motility Outcome: Progressing Goal: Will not experience complications related to urinary retention Outcome: Progressing   Problem: Pain Managment: Goal: General experience of comfort will improve and/or be controlled Outcome: Progressing   Problem: Safety: Goal: Ability to remain free from injury will improve Outcome: Progressing   Problem: Skin Integrity: Goal: Risk for impaired skin integrity will decrease Outcome: Progressing
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
## 2024-07-08 NOTE — Progress Notes (Signed)
   PRENATAL VISIT NOTE  Subjective:  Erin Price is a 33 y.o. G5P4105 at [redacted]w[redacted]d being seen today for ongoing prenatal care.  She is currently monitored for the following issues for this low-risk pregnancy and has Supervision of high risk pregnancy, antepartum; History of preterm delivery, currently pregnant; Echogenic intracardiac focus of fetus on prenatal ultrasound; [redacted] weeks gestation of pregnancy; LGA (large for gestational age) fetus affecting management of mother; Normal labor and delivery; and Encounter for full-term uncomplicated delivery on their problem list.  Patient reports no bleeding, no contractions, no cramping, and no leaking.  Contractions: Irregular. Vag. Bleeding: None.  Movement: Present. Denies leaking of fluid.   The following portions of the patient's history were reviewed and updated as appropriate: allergies, current medications, past family history, past medical history, past social history, past surgical history and problem list.   Objective:    Vitals:   07/07/24 1405  BP: 119/79  Pulse: 92  Weight: 157 lb 3.2 oz (71.3 kg)    Fetal Status:  Fetal Heart Rate (bpm): 148   Movement: Present    General: Alert, oriented and cooperative. Patient is in no acute distress.  Skin: Skin is warm and dry. No rash noted.   Cardiovascular: Normal heart rate noted  Respiratory: Normal respiratory effort, no problems with respiration noted  Abdomen: Soft, gravid, appropriate for gestational age.  Pain/Pressure: Present     Pelvic: Cervical exam performed in the presence of a chaperone      5/90/-1  Extremities: Normal range of motion.  Edema: Mild pitting, slight indentation (feet)  Mental Status: Normal mood and affect. Normal behavior. Normal judgment and thought content.   Assessment and Plan:  Pregnancy: G5P4105 at [redacted]w[redacted]d 1. Supervision of high risk pregnancy, antepartum (Primary) FHR and BP appropriate today  2. History of preterm delivery, currently  pregnant 5 cm dilated today.  Was 4 last week.  No contractions at this time.  3. Echogenic intracardiac focus of fetus on prenatal ultrasound  5. [redacted] weeks gestation of pregnancy  Term labor symptoms and general obstetric precautions including but not limited to vaginal bleeding, contractions, leaking of fluid and fetal movement were reviewed in detail with the patient. Please refer to After Visit Summary for other counseling recommendations.   No follow-ups on file.  Future Appointments  Date Time Provider Department Center  08/23/2024  9:35 AM Ilean, Norleen GAILS, MD [facility]    Norleen GAILS Ilean, MD

## 2024-07-09 MED ORDER — IBUPROFEN 800 MG PO TABS: 800.0000 mg | ORAL_TABLET | Freq: Three times a day (TID) | ORAL | Status: AC | PRN

## 2024-07-09 MED ORDER — ACETAMINOPHEN 325 MG PO TABS: 650.0000 mg | ORAL_TABLET | ORAL | Status: AC | PRN

## 2024-07-09 NOTE — Discharge Summary (Signed)
 Postpartum Discharge Summary       Patient Name: Erin Price DOB: 02-14-1991 MRN: [id number]  Date of admission: 07/07/2024 Delivery date:07/07/2024 Delivering provider: MAGALI BARKLEY CROME Date of discharge: 07/09/2024  Admitting diagnosis: Normal labor and delivery [O80] Encounter for full-term uncomplicated delivery [O80] Intrauterine pregnancy: [redacted]w[redacted]d     Secondary diagnosis:  Principal Problem:   Normal labor and delivery Active Problems:   Encounter for full-term uncomplicated delivery  Additional problems: none    Discharge diagnosis: Term Pregnancy Delivered                                              Post partum procedures:none Augmentation: N/A Complications: None  Hospital course: Onset of Labor With Vaginal Delivery      33 y.o. yo [id number] at [redacted]w[redacted]d was admitted in Active Labor on 07/07/2024. Labor course was complicated by nothing  Membrane Rupture Time/Date: 11:00 PM,07/07/2024  Delivery Method:Vaginal, Spontaneous Operative Delivery:N/A Episiotomy: None Lacerations:  None Patient had a postpartum course complicated by nothing. On day of discharge she noted a bulge at her perineum with straining on the toilet, resolved. Took a photo of this and it appears to be bladder prolapse. Perineum and anterior vagina evaluated at bedside and no prolapse noted, no pain, appears resolved. Return precautions reviewed.  She is ambulating, tolerating a regular diet, passing flatus, and urinating well. Patient is discharged home in stable condition on 07/09/24.  Newborn Data: Birth date:07/07/2024 Birth time:11:14 PM Gender:Female Living status:Living Apgars:9 ,9  Weight:3300 g  Magnesium  Sulfate received: No BMZ received: No Rhophylac:N/A Transfusion:No  Immunizations received: Immunization History  Administered Date(s) Administered   Dtap, Unspecified 12/16/1991, 04/03/1992, 08/24/1992, 01/06/1997   Fluzone Influenza virus vaccine,trivalent (IIV3), split  virus 01/20/2012   HIB, Unspecified 12/16/1991, 04/03/1992, 08/24/1992, 11/01/1992   HPV Quadrivalent 04/28/2006, 07/23/2006, 10/01/2007   Hep A, Unspecified 04/28/2006   Hep B, Unspecified 01/06/1997, 04/29/1997, 07/08/1997   Hepatitis A, Ped/Adol-2 Dose 10/01/2007   Influenza Nasal 10/01/2007   Influenza Split 06/20/2012   Influenza, Seasonal, Injecte, Preservative Fre 10/31/2010, 12/30/2023, 06/08/2024   Influenza,inj,Quad PF,6+ Mos 06/27/2017, 06/03/2018, 10/26/2019, 07/10/2020   Influenza,trivalent, recombinat, inj, PF 06/20/2012   Influenza-Unspecified 06/03/2018, 07/31/2022   MMR 12/15/1992, 01/06/1997   PFIZER(Purple Top)SARS-COV-2 Vaccination 10/18/2020, 11/18/2020   PPD Test 10/26/2019   Polio, Unspecified 12/16/1991, 04/03/1992, 01/06/1997, 07/08/1997   Td 04/28/2006   Tdap 06/20/2012, 06/24/2017, 04/06/2018, 07/10/2020, 04/27/2024   Varicella 05/27/2000, 04/28/2006    Physical exam  Vitals:   07/08/24 0840 07/08/24 1408 07/08/24 1953 07/09/24 0616  BP: 107/69 106/72 114/77 111/71  Pulse: 72 72 82 79  Resp: 18 18 18 18   Temp: 99 F (37.2 C) 98.7 F (37.1 C)  99.2 F (37.3 C)  TempSrc:  Oral  Oral  SpO2: 99% 99% 99% 98%  Weight:      Height:       General: alert, cooperative, and no distress Lochia: appropriate Uterine Fundus: firm Incision: N/A DVT Evaluation: No cords or calf tenderness. Labs: Lab Results  Component Value Date   WBC 18.0 (H) 07/07/2024   HGB 13.1 07/07/2024   HCT 38.2 07/07/2024   MCV 92.0 07/07/2024   PLT 237 07/07/2024      Latest Ref Rng & Units 06/01/2024    9:28 PM  CMP  Glucose 70 - 99 mg/dL 95   BUN 6 -  20 mg/dL 7   Creatinine 9.55 - 8.99 mg/dL 9.46   Sodium 864 - 854 mmol/L 135   Potassium 3.5 - 5.1 mmol/L 3.3   Chloride 98 - 111 mmol/L 105   CO2 22 - 32 mmol/L 18   Calcium 8.9 - 10.3 mg/dL 8.3   Total Protein 6.5 - 8.1 g/dL 6.3   Total Bilirubin 0.0 - 1.2 mg/dL 1.1   Alkaline Phos 38 - 126 U/L 89   AST 15 - 41 U/L 22    ALT 0 - 44 U/L 20    Edinburgh Score:    07/08/2024   11:00 AM  Edinburgh Postnatal Depression Scale Screening Tool  I have been able to laugh and see the funny side of things. 0  I have looked forward with enjoyment to things. 0  I have blamed myself unnecessarily when things went wrong. 0  I have been anxious or worried for no good reason. 0  I have felt scared or panicky for no good reason. 0  Things have been getting on top of me. 0  I have been so unhappy that I have had difficulty sleeping. 0  I have felt sad or miserable. 0  I have been so unhappy that I have been crying. 0  The thought of harming myself has occurred to me. 0  Edinburgh Postnatal Depression Scale Total 0   Edinburgh Postnatal Depression Scale Total: 0   After visit meds:  Allergies as of 07/09/2024       Reactions   Porcine (pork) Protein-containing Drug Products         Medication List     TAKE these medications    acetaminophen  325 MG tablet Commonly known as: TYLENOL  Take 2 tablets (650 mg total) by mouth every 4 (four) hours as needed (for pain scale < 4  OR  temperature  >/=  100.5 F).   Blood Pressure Kit Devi 1 each by Does not apply route as needed.   ibuprofen  800 MG tablet Commonly known as: ADVIL  Take 1 tablet (800 mg total) by mouth every 8 (eight) hours as needed.   multivitamin-prenatal 27-0.8 MG Tabs tablet Take 1 tablet by mouth daily at 12 noon.         Discharge home in stable condition Infant Feeding: Bottle and Breast Infant Disposition:home with mother Discharge instruction: per After Visit Summary and Postpartum booklet. Activity: Advance as tolerated. Pelvic rest for 6 weeks.  Diet: routine diet Future Appointments: Future Appointments  Date Time Provider Department Center  08/23/2024  9:35 AM Ilean, Norleen GAILS, MD [facility]   Follow up Visit:  Has pp visit scheduled  Please schedule this patient for a In person postpartum visit in 4 weeks with the  following provider: Any provider. Additional Postpartum F/U:n/a  Low risk pregnancy complicated by: no complications Delivery mode:  Vaginal, Spontaneous Anticipated Birth Control:  Depo, outpatient   07/09/2024 Devaughn KATHEE Ban, MD
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
## 2024-07-09 NOTE — Patient Instructions (Signed)
 If interested in an outpatient lactation consult in office or virtually please reach out to us  at [facility] (First Floor) [address] [place] Please feel free to out with any lactation related questions or concerns [phone]  to leave a message for our lactation voicemail box.  Lactation support groups:  [facility], Tuesdays 10:00 am -12:00 pm at [address] on the second floor in the conference room, lactating parents and lap babies welcome.  [email or web address]  [email or web address]      Erin Price, [facility]

## 2024-07-16 ENCOUNTER — Encounter: Admitting: Family Medicine

## 2024-07-17 ENCOUNTER — Telehealth (HOSPITAL_COMMUNITY): Payer: Self-pay | Admitting: *Deleted

## 2024-07-17 NOTE — Telephone Encounter (Signed)
 07/17/2024  Name: Erin Price MRN: [id number] DOB: [birth year]  Reason for Call:  Transition of Care Hospital Discharge Call  Contact Status: Patient Contact Status: Message  Language assistant needed:          Follow-Up Questions:    Van Postnatal Depression Scale:  In the Past 7 Days:    PHQ2-9 Depression Scale:     Discharge Follow-up:    Post-discharge interventions: NA  Mliss Sieve, RN 07/17/2024 12:03

## 2024-07-23 ENCOUNTER — Encounter: Admitting: Family Medicine

## 2024-08-23 ENCOUNTER — Ambulatory Visit: Admitting: Family Medicine

## 2024-08-24 ENCOUNTER — Ambulatory Visit: Admitting: Obstetrics and Gynecology

## 2024-08-24 ENCOUNTER — Other Ambulatory Visit: Payer: Self-pay

## 2024-08-24 ENCOUNTER — Other Ambulatory Visit (HOSPITAL_COMMUNITY)
Admission: RE | Admit: 2024-08-24 | Discharge: 2024-08-24 | Disposition: A | Source: Ambulatory Visit | Attending: Dermatology | Admitting: Dermatology

## 2024-08-24 DIAGNOSIS — N898 Other specified noninflammatory disorders of vagina: Secondary | ICD-10-CM

## 2024-08-24 DIAGNOSIS — Z124 Encounter for screening for malignant neoplasm of cervix: Secondary | ICD-10-CM | POA: Insufficient documentation

## 2024-08-24 DIAGNOSIS — Z3009 Encounter for other general counseling and advice on contraception: Secondary | ICD-10-CM

## 2024-08-24 NOTE — Progress Notes (Signed)
 Post Partum Visit Note  Erin Price is a 33 y.o. [id number] female who presents for a postpartum visit. She is 6.6 weeks postpartum following a normal spontaneous vaginal delivery.  I have fully reviewed the prenatal and intrapartum course. The delivery was at 38.3 gestational weeks.  Anesthesia: none. Postpartum course has been well. Baby is doing well. Baby is feeding by bottle - Similac Advance. Bleeding no bleeding. Bowel function is normal. Bladder function is normal. Patient is sexually active. Contraception method is Depo-Provera injections. Postpartum depression screening: negative.  Had unprotected sex last week. Has been on depo before and happy to continue.   The prenancy intention screening data noted above was reviewed. Potential methods of contraception were discussed. The patient elected to proceed with No data recorded.   Edinburgh Postnatal Depression Scale - 08/24/24 1455       Edinburgh Postnatal Depression Scale:  In the Past 7 Days   I have been able to laugh and see the funny side of things. 0    I have looked forward with enjoyment to things. 0    I have blamed myself unnecessarily when things went wrong. 0    I have been anxious or worried for no good reason. 0    I have felt scared or panicky for no good reason. 0    Things have been getting on top of me. 0    I have been so unhappy that I have had difficulty sleeping. 0    I have felt sad or miserable. 0    I have been so unhappy that I have been crying. 0    The thought of harming myself has occurred to me. 0    Edinburgh Postnatal Depression Scale Total 0         Health Maintenance Due  Topic Date Due   Pneumococcal Vaccine (1 of 2 - PCV) Never done   Cervical Cancer Screening (HPV/Pap Cotest)  04/06/2023   COVID-19 Vaccine (3 - 2025-26 season) 05/24/2024    The following portions of the patient's history were reviewed and updated as appropriate: allergies, current medications, past family  history, past medical history, past social history, past surgical history, and problem list.  Review of Systems Pertinent items are noted in HPI.  Objective:  BP 108/72   Pulse 65   Wt 141 lb 1.6 oz (64 kg)   LMP 09/21/2023   Breastfeeding No   BMI 26.66 kg/m    General:  alert, cooperative, and appears stated age   Breasts:  not indicated  Lungs: Normal respiratory effort noted  Heart:  Normal heart rate noted  Abdomen:   Wound N/a  GU exam:  normal       Assessment:   1. Postpartum state (Primary) Doing well  2. Vaginal discharge Swab today  3. Encounter for counseling regarding contraception Wants depo, had unprotected sex last week Reviewed need two weeks unprotected intercourse She will return next week for depo initiation  4. Cervical cancer screening Pap today  Normal postpartum exam.   Plan:   Essential components of care per ACOG recommendations:  1.  Mood and well being: Patient with negative depression screening today. Reviewed local resources for support.  - Patient tobacco use? No.   - hx of drug use? MJ    2. Infant care and feeding:  -Patient currently breastmilk feeding? No.  -Social determinants of health (SDOH) reviewed in EPIC. No concerns  3. Sexuality, contraception and birth spacing - Reviewed  reproductive life planning. Reviewed contraceptive methods based on pt preferences and effectiveness.  Patient desired Hormonal Injection today.   - Discussed birth spacing of 18 months  4. Sleep and fatigue -Encouraged family/partner/community support of 4 hrs of uninterrupted sleep to help with mood and fatigue  5. Physical Recovery  - Discussed patients delivery and complications. She describes her labor as good. - Patient had a Vaginal, no problems at delivery - Patient has urinary incontinence? No. - Patient is safe to resume physical and sexual activity  6.  Health Maintenance - HM due items addressed Yes - Last pap smear  Diagnosis   Date Value Ref Range Status  04/05/2020   Final   - Negative for intraepithelial lesion or malignancy (NILM)   Pap smear done at today's visit.  -Breast Cancer screening indicated? No.   7. Chronic Disease/Pregnancy Condition follow up: None  - PCP follow up  Burnard CHRISTELLA Moats, MD Center for [facility], [facility]

## 2024-08-25 ENCOUNTER — Encounter: Payer: Self-pay | Admitting: Obstetrics and Gynecology

## 2024-08-25 LAB — CERVICOVAGINAL ANCILLARY ONLY
Bacterial Vaginitis (gardnerella): POSITIVE — AB
Candida Glabrata: NEGATIVE
Candida Vaginitis: NEGATIVE
Chlamydia: NEGATIVE
Comment: NEGATIVE
Comment: NEGATIVE
Comment: NEGATIVE
Comment: NEGATIVE
Comment: NEGATIVE
Comment: NORMAL
Neisseria Gonorrhea: NEGATIVE
Trichomonas: NEGATIVE

## 2024-08-25 LAB — CYTOLOGY - PAP
Comment: NEGATIVE
Diagnosis: NEGATIVE
High risk HPV: NEGATIVE

## 2024-08-26 ENCOUNTER — Ambulatory Visit: Payer: Self-pay | Admitting: Obstetrics and Gynecology

## 2024-08-26 MED ORDER — METRONIDAZOLE 500 MG PO TABS: 500.0000 mg | ORAL_TABLET | Freq: Two times a day (BID) | ORAL | 0 refills | Status: AC

## 2024-08-26 NOTE — Telephone Encounter (Signed)
 Antibiotic sent by Dr. Nicholaus.  Provider informed pt.

## 2024-09-02 ENCOUNTER — Ambulatory Visit: Payer: Self-pay | Admitting: Obstetrics & Gynecology

## 2024-09-02 ENCOUNTER — Other Ambulatory Visit: Payer: Self-pay

## 2024-09-02 ENCOUNTER — Ambulatory Visit

## 2024-09-02 VITALS — BP 118/76 | HR 80 | Ht 61.0 in | Wt 139.3 lb

## 2024-09-02 DIAGNOSIS — Z3042 Encounter for surveillance of injectable contraceptive: Secondary | ICD-10-CM | POA: Diagnosis not present

## 2024-09-02 DIAGNOSIS — Z3202 Encounter for pregnancy test, result negative: Secondary | ICD-10-CM | POA: Diagnosis not present

## 2024-09-02 LAB — POCT PREGNANCY, URINE: Preg Test, Ur: NEGATIVE

## 2024-09-02 MED ORDER — MEDROXYPROGESTERONE ACETATE 150 MG/ML IM SUSP
150.0000 mg | Freq: Once | INTRAMUSCULAR | Status: AC
  Administered 2024-09-02: 150 mg via INTRAMUSCULAR

## 2024-09-02 NOTE — Progress Notes (Signed)
 Erin Price is here today for Depo-Provera initiation. [name] was last seen in the office 08/24/24- Postpartum visit. Patient wanted Depo-Provera 08/24/24, but had unprotected sexual intercourse a week prior.   Patient states it has been 2 weeks since unprotected sexual intercourse. Patient UPT result negative.  Explained a method backup contraception is needed for 2 weeks after receiving this dose; patient states she will use condoms. Patient verbalizes no further questions or concerns.   Telford Ferrier here for Depo-Provera Injection. Injection administered without complication. Patient will return in 3 months for next injection between 11/18/24 and 12/02/24. Next annual visit due 08/2025  [name] 09/02/2024

## 2024-11-18 ENCOUNTER — Ambulatory Visit: Payer: Self-pay
